# Patient Record
Sex: Male | Born: 1980
Health system: Southern US, Community
[De-identification: ages and names within clinical notes are randomized; demographics above are authoritative.]

## PROBLEM LIST (undated history)

## (undated) DIAGNOSIS — G43009 Migraine without aura, not intractable, without status migrainosus: Secondary | ICD-10-CM

## (undated) DIAGNOSIS — J45909 Unspecified asthma, uncomplicated: Secondary | ICD-10-CM

## (undated) DIAGNOSIS — F259 Schizoaffective disorder, unspecified: Secondary | ICD-10-CM

## (undated) HISTORY — PX: MOUTH SURGERY: SHX715

---

## 1999-08-14 ENCOUNTER — Emergency Department (HOSPITAL_COMMUNITY): Admission: EM | Admit: 1999-08-14 | Discharge: 1999-08-14 | Payer: Self-pay | Admitting: Emergency Medicine

## 2001-12-28 ENCOUNTER — Emergency Department (HOSPITAL_COMMUNITY): Admission: EM | Admit: 2001-12-28 | Discharge: 2001-12-28 | Payer: Self-pay | Admitting: Emergency Medicine

## 2002-01-15 ENCOUNTER — Emergency Department (HOSPITAL_COMMUNITY): Admission: EM | Admit: 2002-01-15 | Discharge: 2002-01-15 | Payer: Self-pay | Admitting: Emergency Medicine

## 2002-01-15 ENCOUNTER — Encounter: Payer: Self-pay | Admitting: Emergency Medicine

## 2004-09-12 ENCOUNTER — Emergency Department (HOSPITAL_COMMUNITY): Admission: EM | Admit: 2004-09-12 | Discharge: 2004-09-12 | Payer: Self-pay | Admitting: Emergency Medicine

## 2008-01-13 ENCOUNTER — Emergency Department (HOSPITAL_COMMUNITY): Admission: EM | Admit: 2008-01-13 | Discharge: 2008-01-13 | Payer: Self-pay | Admitting: Emergency Medicine

## 2008-08-18 ENCOUNTER — Emergency Department (HOSPITAL_COMMUNITY): Admission: EM | Admit: 2008-08-18 | Discharge: 2008-08-18 | Payer: Self-pay | Admitting: Emergency Medicine

## 2009-03-05 ENCOUNTER — Emergency Department (HOSPITAL_COMMUNITY): Admission: EM | Admit: 2009-03-05 | Discharge: 2009-03-06 | Payer: Self-pay | Admitting: Emergency Medicine

## 2009-03-10 ENCOUNTER — Emergency Department (HOSPITAL_COMMUNITY): Admission: EM | Admit: 2009-03-10 | Discharge: 2009-03-10 | Payer: Self-pay | Admitting: Emergency Medicine

## 2009-08-12 ENCOUNTER — Emergency Department (HOSPITAL_COMMUNITY): Admission: EM | Admit: 2009-08-12 | Discharge: 2009-08-12 | Payer: Self-pay | Admitting: Emergency Medicine

## 2011-09-05 LAB — GC/CHLAMYDIA PROBE AMP, GENITAL
Chlamydia, DNA Probe: POSITIVE — AB
GC Probe Amp, Genital: NEGATIVE

## 2011-09-18 LAB — BASIC METABOLIC PANEL
BUN: 10
CO2: 27
Calcium: 8.8
Chloride: 101
Creatinine, Ser: 1.06
GFR calc Af Amer: >60
GFR calc non Af Amer: >60
Glucose, Bld: 95
Potassium: 5
Sodium: 134 — ABNORMAL LOW

## 2011-09-18 LAB — CBC
HCT: 43.6
Hemoglobin: 14.5
MCHC: 33.3
MCV: 93
Platelets: 183
RBC: 4.69
RDW: 13.5
WBC: 7.8

## 2012-06-18 ENCOUNTER — Encounter (HOSPITAL_COMMUNITY): Payer: Self-pay | Admitting: *Deleted

## 2012-06-18 ENCOUNTER — Emergency Department (HOSPITAL_COMMUNITY)
Admission: EM | Admit: 2012-06-18 | Discharge: 2012-06-18 | Disposition: A | Discharge status: Home / Self Care | Payer: Self-pay | Attending: Emergency Medicine | Admitting: Emergency Medicine

## 2012-06-18 DIAGNOSIS — F17200 Nicotine dependence, unspecified, uncomplicated: Secondary | ICD-10-CM | POA: Insufficient documentation

## 2012-06-18 DIAGNOSIS — Z202 Contact with and (suspected) exposure to infections with a predominantly sexual mode of transmission: Secondary | ICD-10-CM | POA: Insufficient documentation

## 2012-06-18 MED ORDER — CEFTRIAXONE SODIUM 250 MG IJ SOLR
250.0000 mg | Freq: Once | INTRAMUSCULAR | Status: AC
  Administered 2012-06-18: 250 mg via INTRAMUSCULAR
  Filled 2012-06-18: qty 250

## 2012-06-18 MED ORDER — AZITHROMYCIN 1 G PO PACK
1.0000 g | PACK | Freq: Once | ORAL | Status: AC
  Administered 2012-06-18: 1 g via ORAL
  Filled 2012-06-18: qty 1

## 2012-06-18 NOTE — ED Provider Notes (Signed)
Medical screening examination/treatment/procedure(s) were performed by non-physician practitioner and as supervising physician I was immediately available for consultation/collaboration.    Nelia Shi, MD 06/18/12 2322

## 2012-06-18 NOTE — ED Notes (Signed)
Patient states that he wants to be checked out for sexual transmitted disease.  Denies being exposed recently.  Just states that he wants to be checked out.  Patient denies any symptoms

## 2012-06-18 NOTE — ED Provider Notes (Signed)
History     CSN: [id number]  Arrival date & time 06/18/12  0815   First MD Initiated Contact with Patient 06/18/12 [phone]      Chief Complaint  Patient presents with  . SEXUALLY TRANSMITTED DISEASE    (Consider location/radiation/quality/duration/timing/severity/associated sxs/prior treatment) HPI Comments: Patient is a 31 y/o male who presents with concerns about possible STI exposure. The patient stated that his girlfriend of one year was recently treated for a yeast infection with Fluconazole. The patient states that the relationship is monogamous and does not use protection. However, this information from his girlfriend caused him some concern that he may be at risk for an STI.  Denies, itching, dysuria, penile discharge, or genital sores. Denies other sexual partners.    History reviewed. No pertinent past medical history.  History reviewed. No pertinent past surgical history.  History reviewed. No pertinent family history.  History  Substance Use Topics  . Smoking status: Passive Smoker    Types: Cigarettes  . Smokeless tobacco: Not on file  . Alcohol Use: Yes      Review of Systems  Constitutional: Negative for fever and chills.  Gastrointestinal: Negative for abdominal pain.  Genitourinary: Negative for dysuria, discharge, penile swelling, scrotal swelling, difficulty urinating, genital sores, penile pain and testicular pain.    Allergies  Onion  Home Medications  No current outpatient prescriptions on file.  BP 152/87  Pulse 72  Temp 98.1 F (36.7 C)  Resp 20  SpO2 99%  Physical Exam  Constitutional: He appears well-developed and well-nourished.  Abdominal: Soft.  Genitourinary: Penis normal.       No sores noted on penis or scrotum. No testicular mass. No penile discharge.    ED Course  Procedures (including critical care time)   Labs Reviewed  GC/CHLAMYDIA PROBE AMP, GENITAL   No results found.   1. Potential exposure to STD       MDM   Patient is a 31 y/o male with possible STI exposure. Patient was swabbed for GC/Chlamydia and informed that results will be available in two days, will follow-up if positive. Patient was empirically treated with Rocephin and Azithromycin. Patient was also informed that he can be tested for other STI's at the local health department.       Pixie Casino, PA-C 06/18/12 1020

## 2012-06-20 LAB — GC/CHLAMYDIA PROBE AMP, GENITAL: GC Probe Amp, Genital: NEGATIVE

## 2014-05-25 ENCOUNTER — Emergency Department (INDEPENDENT_AMBULATORY_CARE_PROVIDER_SITE_OTHER)
Admission: EM | Admit: 2014-05-25 | Discharge: 2014-05-25 | Disposition: A | Discharge status: Home / Self Care | Payer: Self-pay | Source: Home / Self Care

## 2014-05-25 ENCOUNTER — Encounter (HOSPITAL_COMMUNITY): Payer: Self-pay | Admitting: Family Medicine

## 2014-05-25 DIAGNOSIS — R109 Unspecified abdominal pain: Secondary | ICD-10-CM

## 2014-05-25 DIAGNOSIS — S7010XA Contusion of unspecified thigh, initial encounter: Secondary | ICD-10-CM

## 2014-05-25 DIAGNOSIS — Z202 Contact with and (suspected) exposure to infections with a predominantly sexual mode of transmission: Secondary | ICD-10-CM

## 2014-05-25 LAB — POCT I-STAT, CHEM 8
BUN: 12 mg/dL (ref 6–23)
CALCIUM ION: 1.25 mmol/L — AB (ref 1.12–1.23)
CHLORIDE: 100 meq/L (ref 96–112)
Creatinine, Ser: 1.3 mg/dL (ref 0.50–1.35)
Glucose, Bld: 97 mg/dL (ref 70–99)
HEMATOCRIT: 48 % (ref 39.0–52.0)
Hemoglobin: 16.3 g/dL (ref 13.0–17.0)
POTASSIUM: 4.2 meq/L (ref 3.7–5.3)
Sodium: 143 mEq/L (ref 137–147)
TCO2: 30 mmol/L (ref 0–100)

## 2014-05-25 LAB — HEMOGLOBIN A1C
HEMOGLOBIN A1C: 5.8 % — AB (ref <5.7)
MEAN PLASMA GLUCOSE: 120 mg/dL — AB (ref <117)

## 2014-05-25 LAB — POCT URINALYSIS DIP (DEVICE)
GLUCOSE, UA: 100 mg/dL — AB
Hgb urine dipstick: NEGATIVE
Leukocytes, UA: NEGATIVE
Nitrite: NEGATIVE
Protein, ur: 30 mg/dL — AB
SPECIFIC GRAVITY, URINE: 1.025 (ref 1.005–1.030)
Urobilinogen, UA: 1 mg/dL (ref 0.0–1.0)
pH: 7 (ref 5.0–8.0)

## 2014-05-25 MED ORDER — IBUPROFEN 800 MG PO TABS
ORAL_TABLET | ORAL | Status: AC
  Filled 2014-05-25: qty 1

## 2014-05-25 MED ORDER — IBUPROFEN 800 MG PO TABS
800.0000 mg | ORAL_TABLET | Freq: Once | ORAL | Status: AC
  Administered 2014-05-25: 800 mg via ORAL

## 2014-05-25 NOTE — Discharge Instructions (Signed)
The cause of your side pain is not clear The lab work from today showed some sugar in your urine. This can come from many things but diabetes is a possibility. We are checking you today for diabetes. Please stay well hydrated with water and use advil (600mg ) for your pain. We will let you know if anything else comes back positive.  Please follow up with your regular doctor as needed

## 2014-05-25 NOTE — ED Notes (Signed)
Multiple complaints-seen by dr Konrad Dolores prior to this nurse

## 2014-05-25 NOTE — ED Provider Notes (Signed)
CSN: [id number]     Arrival date & time 05/25/14  6153 History   None    Chief Complaint  Patient presents with  . Leg Pain  . Exposure to STD   (Consider location/radiation/quality/duration/timing/severity/associated sxs/prior Treatment) HPI  Pain in "kidney". Sharp. Comes and goes. No aggrevating factors. Denies frequency or dysuria. Denies fevers or penile discharge. New sexual partner and not using condoms. Girlfirend recently tested adn everything came back negative.    R knee injury. Fell yesterday off porch and hit the concrete stair. No swelling, popping. Ambulation w/o difficulty.   History reviewed. No pertinent past medical history. History reviewed. No pertinent past surgical history. History reviewed. No pertinent family history. History  Substance Use Topics  . Smoking status: Passive Smoke Exposure - Never Smoker    Types: Cigarettes  . Smokeless tobacco: Not on file  . Alcohol Use: Yes    Review of Systems  Constitutional: Negative for fever.  Genitourinary: Positive for flank pain. Negative for dysuria, urgency, hematuria, discharge, genital sores and penile pain.  All other systems reviewed and are negative.   Allergies  Onion  Home Medications   Prior to Admission medications   Not on File   BP 138/83  Pulse 57  Temp(Src) 97.7 F (36.5 C) (Oral)  Resp 16  SpO2 98% Physical Exam  Constitutional: He appears well-developed. No distress.  HENT:  Head: Normocephalic and atraumatic.  Eyes: Pupils are equal, round, and reactive to light.  Neck: Normal range of motion.  Pulmonary/Chest: Effort normal. No respiratory distress.  Abdominal: Soft. He exhibits no distension.  Musculoskeletal:  No CVA tenderness R knee FROM, mild lateral distal thigh ttp along w/ mild vastus lateralis swelling w/ superficial abrasion. No induration or fluctuance   Neurological: He is alert.  Skin: Skin is warm. He is not diaphoretic.  Psychiatric: He has a normal mood  and affect. His behavior is normal. Judgment and thought content normal.    ED Course  Procedures (including critical care time) Labs Review Labs Reviewed  POCT URINALYSIS DIP (DEVICE) - Abnormal; Notable for the following:    Glucose, UA 100 (*)    Bilirubin Urine SMALL (*)    Ketones, ur TRACE (*)    Protein, ur 30 (*)    All other components within normal limits  POCT I-STAT, CHEM 8 - Abnormal; Notable for the following:    Calcium, Ion 1.25 (*)    All other components within normal limits  URINE CULTURE  HEMOGLOBIN A1C    Imaging Review No results found.   MDM   1. Side pain   2. Possible exposure to STD   3. Thigh contusion    Abnormal urine today possibly due to diabetic injury to the kidneys vs other illness. No sign of overt UTI or pyelo. STD unlikely but possible. Urine cultures sent, STD panel sent R thigh contusion - ACE wrap applied in office along w/ Motrin 800.  - precautions givena nd all questions answered.   Shelly Flatten, MD Family Medicine PGY-3 05/25/2014, 9:58 AM    Ozella Rocks, MD 05/25/14 [phone]

## 2014-05-25 NOTE — ED Provider Notes (Signed)
Medical screening examination/treatment/procedure(s) were performed by a resident physician or non-physician practitioner and as the supervising physician I was immediately available for consultation/collaboration.  Lynne Righi, MD    Amala Petion S Brenen Beigel, MD 05/25/14 1630 

## 2014-05-26 LAB — URINE CULTURE
CULTURE: NO GROWTH
Colony Count: NO GROWTH

## 2014-05-27 NOTE — ED Notes (Addendum)
Hgb A1C 5.8 with mean glucose of 120, Urine culture: No growth.  Message sent to Austin Gutierrez. Austin Gutierrez, Austin Gutierrez 05/27/2014 Austin Gutierrez wrote that it was not necessary to notify pt. " barely pre DM." Austin Gutierrez, Austin Gutierrez 05/30/2014

## 2014-06-03 ENCOUNTER — Emergency Department (INDEPENDENT_AMBULATORY_CARE_PROVIDER_SITE_OTHER)
Admission: EM | Admit: 2014-06-03 | Discharge: 2014-06-03 | Disposition: A | Discharge status: Home / Self Care | Payer: Self-pay | Source: Home / Self Care | Attending: Family Medicine | Admitting: Family Medicine

## 2014-06-03 ENCOUNTER — Encounter (HOSPITAL_COMMUNITY): Payer: Self-pay | Admitting: Emergency Medicine

## 2014-06-03 DIAGNOSIS — H6690 Otitis media, unspecified, unspecified ear: Secondary | ICD-10-CM

## 2014-06-03 DIAGNOSIS — H6691 Otitis media, unspecified, right ear: Secondary | ICD-10-CM

## 2014-06-03 MED ORDER — ANTIPYRINE-BENZOCAINE 5.4-1.4 % OT SOLN
3.0000 [drp] | OTIC | Status: DC | PRN
  Administered 2014-06-03: 3 [drp] via OTIC

## 2014-06-03 MED ORDER — NEOMYCIN-POLYMYXIN-HC 1 % OT SOLN
4.0000 [drp] | Freq: Four times a day (QID) | OTIC | Status: DC
  Administered 2014-06-03: 4 [drp] via OTIC

## 2014-06-03 MED ORDER — AMOXICILLIN-POT CLAVULANATE 875-125 MG PO TABS: 1.0000 | ORAL_TABLET | Freq: Two times a day (BID) | ORAL | Status: DC

## 2014-06-03 NOTE — Discharge Instructions (Signed)
Thank you for coming in today. Use the neomycin polymyxin B eardrops every 6 hours Use the Auralgan ear drops every 2 hours as needed for pain.  Take Augmentin twice daily Augmentin will be cheaper at [facility] ($18)  Return tomorrow if not 100% better.  Otitis Externa Otitis externa is a bacterial or fungal infection of the outer ear canal. This is the area from the eardrum to the outside of the ear. Otitis externa is sometimes called "swimmer's ear." CAUSES  Possible causes of infection include:  Swimming in dirty water.  Moisture remaining in the ear after swimming or bathing.  Mild injury (trauma) to the ear.  Objects stuck in the ear (foreign body).  Cuts or scrapes (abrasions) on the outside of the ear. SYMPTOMS  The first symptom of infection is often itching in the ear canal. Later signs and symptoms may include swelling and redness of the ear canal, ear pain, and yellowish-white fluid (pus) coming from the ear. The ear pain may be worse when pulling on the earlobe. DIAGNOSIS  Your caregiver will perform a physical exam. A sample of fluid may be taken from the ear and examined for bacteria or fungi. TREATMENT  Antibiotic ear drops are often given for 10 to 14 days. Treatment may also include pain medicine or corticosteroids to reduce itching and swelling. PREVENTION   Keep your ear dry. Use the corner of a towel to absorb water out of the ear canal after swimming or bathing.  Avoid scratching or putting objects inside your ear. This can damage the ear canal or remove the protective wax that lines the canal. This makes it easier for bacteria and fungi to grow.  Avoid swimming in lakes, polluted water, or poorly chlorinated pools.  You may use ear drops made of rubbing alcohol and vinegar after swimming. Combine equal parts of white vinegar and alcohol in a bottle. Put 3 or 4 drops into each ear after swimming. HOME CARE INSTRUCTIONS   Apply antibiotic ear drops to the ear  canal as prescribed by your caregiver.  Only take over-the-counter or prescription medicines for pain, discomfort, or fever as directed by your caregiver.  If you have diabetes, follow any additional treatment instructions from your caregiver.  Keep all follow-up appointments as directed by your caregiver. SEEK MEDICAL CARE IF:   You have a fever.  Your ear is still red, swollen, painful, or draining pus after 3 days.  Your redness, swelling, or pain gets worse.  You have a severe headache.  You have redness, swelling, pain, or tenderness in the area behind your ear. MAKE SURE YOU:   Understand these instructions.  Will watch your condition.  Will get help right away if you are not doing well or get worse. Document Released: 12/02/2005 Document Revised: 02/24/2012 Document Reviewed: 12/19/2011 [facility] Patient Information 2015 [place], [place]. This information is not intended to replace advice given to you by your health care provider. Make sure you discuss any questions you have with your health care provider.   Vertigo Vertigo means you feel like you or your surroundings are moving when they are not. Vertigo can be dangerous if it occurs when you are at work, driving, or performing difficult activities.  CAUSES  Vertigo occurs when there is a conflict of signals sent to your brain from the visual and sensory systems in your body. There are many different causes of vertigo, including:  Infections, especially in the inner ear.  A bad reaction to a drug or  misuse of alcohol and medicines.  Withdrawal from drugs or alcohol.  Rapidly changing positions, such as lying down or rolling over in bed.  A migraine headache.  Decreased blood flow to the brain.  Increased pressure in the brain from a head injury, infection, tumor, or bleeding. SYMPTOMS  You may feel as though the world is spinning around or you are falling to the ground. Because your balance is upset, vertigo can  cause nausea and vomiting. You may have involuntary eye movements (nystagmus). DIAGNOSIS  Vertigo is usually diagnosed by physical exam. If the cause of your vertigo is unknown, your caregiver may perform imaging tests, such as an MRI scan (magnetic resonance imaging). TREATMENT  Most cases of vertigo resolve on their own, without treatment. Depending on the cause, your caregiver may prescribe certain medicines. If your vertigo is related to body position issues, your caregiver may recommend movements or procedures to correct the problem. In rare cases, if your vertigo is caused by certain inner ear problems, you may need surgery. HOME CARE INSTRUCTIONS   Follow your caregiver's instructions.  Avoid driving.  Avoid operating heavy machinery.  Avoid performing any tasks that would be dangerous to you or others during a vertigo episode.  Tell your caregiver if you notice that certain medicines seem to be causing your vertigo. Some of the medicines used to treat vertigo episodes can actually make them worse in some people. SEEK IMMEDIATE MEDICAL CARE IF:   Your medicines do not relieve your vertigo or are making it worse.  You develop problems with talking, walking, weakness, or using your arms, hands, or legs.  You develop severe headaches.  Your nausea or vomiting continues or gets worse.  You develop visual changes.  A family member notices behavioral changes.  Your condition gets worse. MAKE SURE YOU:  Understand these instructions.  Will watch your condition.  Will get help right away if you are not doing well or get worse. Document Released: 09/11/2005 Document Revised: 02/24/2012 Document Reviewed: 06/20/2011 [facility] Patient Information 2015 [place], [place]. This information is not intended to replace advice given to you by your health care provider. Make sure you discuss any questions you have with your health care provider.

## 2014-06-03 NOTE — ED Provider Notes (Signed)
Austin Gutierrez is a 33 y.o. male who presents to Urgent Care today for ear pain. Patient has severe right-sided ear pain with discharge present for the last 2 days. It is associated with mild dizziness. He denies any fevers or chills nausea vomiting or diarrhea. He has tried some over-the-counter eardrops which have not helped. He denies any blurry vision. He denies any change in hearing. He denies any tinnitus.   History reviewed. No pertinent past medical history. History  Substance Use Topics  . Smoking status: Passive Smoke Exposure - Never Smoker    Types: Cigarettes  . Smokeless tobacco: Not on file  . Alcohol Use: Yes   ROS as above Medications: Current Facility-Administered Medications  Medication Dose Route Frequency Provider Last Rate Last Dose  . antipyrine-benzocaine (AURALGAN) otic solution 3-4 drop  3-4 drop Left Ear Q2H PRN Rodolph BongEvan S Corey, MD   3 drop at 06/03/14 1804  . NEOMYCIN-POLYMYXIN-HYDROCORTISONE (CORTISPORIN) otic solution 4 drop  4 drop Left Ear 4 times per day Rodolph BongEvan S Corey, MD   4 drop at 06/03/14 1804   Current Outpatient Prescriptions  Medication Sig Dispense Refill  . amoxicillin-clavulanate (AUGMENTIN) 875-125 MG per tablet Take 1 tablet by mouth every 12 (twelve) hours.  14 tablet  0    Exam:  BP 141/81  Pulse 78  Temp(Src) 99.9 F (37.7 C) (Oral)  Resp 20  SpO2 100% Gen: Well NAD HEENT: EOMI,  MMM right ear canal is erythematous with discharge. The tympanic membrane is erythematous without effusion. Patient has no tenderness to the mastoid bilaterally. The left membranes retracted but normal-appearing otherwise. Lungs: Normal work of breathing. CTABL Heart: RRR no MRG Abd: NABS, Soft. NT, ND Exts: Brisk capillary refill, warm and well perfused.  Neuro: Alert and oriented normal coordination. PERRLA. Impaired Romberg balance. Normal gait. Sensation and strength are intact throughout.  No results found for this or any previous visit (from the past 24  hour(s)). No results found.  Assessment and Plan: 33 y.o. male with otitis externa. This is complicated by dizziness. I suspect the dizziness is related to his urine infection. He has no clinical signs of mastoiditis. Plan for aggressive therapy with topical Cortisporin drops as well as oral Augmentin and followup tomorrow. Present to the emergency room with worsening. Auralgan for pain control.  Discussed warning signs or symptoms. Please see discharge instructions. Patient expresses understanding.    Rodolph BongEvan S Corey, MD 06/03/14 [phone]

## 2014-06-03 NOTE — ED Notes (Signed)
C/o  Severe  right ear pain with clear drainage x 2 days.   Low grade temp.   States feel extremely dizzy.   No relief with otc ear drops.

## 2014-07-09 ENCOUNTER — Encounter (HOSPITAL_COMMUNITY): Payer: Self-pay | Admitting: Emergency Medicine

## 2014-07-09 ENCOUNTER — Emergency Department (INDEPENDENT_AMBULATORY_CARE_PROVIDER_SITE_OTHER)
Admission: EM | Admit: 2014-07-09 | Discharge: 2014-07-09 | Disposition: A | Discharge status: Home / Self Care | Payer: Self-pay | Source: Home / Self Care | Attending: Family Medicine | Admitting: Family Medicine

## 2014-07-09 DIAGNOSIS — Z202 Contact with and (suspected) exposure to infections with a predominantly sexual mode of transmission: Secondary | ICD-10-CM

## 2014-07-09 MED ORDER — AZITHROMYCIN 250 MG PO TABS
1000.0000 mg | ORAL_TABLET | Freq: Once | ORAL | Status: AC
  Administered 2014-07-09: 1000 mg via ORAL

## 2014-07-09 MED ORDER — LIDOCAINE HCL (PF) 1 % IJ SOLN
INTRAMUSCULAR | Status: AC
  Filled 2014-07-09: qty 5

## 2014-07-09 MED ORDER — CEFTRIAXONE SODIUM 250 MG IJ SOLR
250.0000 mg | Freq: Once | INTRAMUSCULAR | Status: AC
  Administered 2014-07-09: 250 mg via INTRAMUSCULAR

## 2014-07-09 MED ORDER — CEFTRIAXONE SODIUM 250 MG IJ SOLR
INTRAMUSCULAR | Status: AC
  Filled 2014-07-09: qty 250

## 2014-07-09 MED ORDER — AZITHROMYCIN 250 MG PO TABS
ORAL_TABLET | ORAL | Status: AC
  Filled 2014-07-09: qty 4

## 2014-07-09 NOTE — Discharge Instructions (Signed)
You were treated for STDs today Your treatment is complete Come back if you develop any further symptoms  Sexually Transmitted Disease A sexually transmitted disease (STD) is a disease or infection that may be passed (transmitted) from person to person, usually during sexual activity. This may happen by way of saliva, semen, blood, vaginal mucus, or urine. Common STDs include:   Gonorrhea.   Chlamydia.   Syphilis.   HIV and AIDS.   Genital herpes.   Hepatitis B and C.   Trichomonas.   Human papillomavirus (HPV).   Pubic lice.   Scabies.  Mites.  Bacterial vaginosis. WHAT ARE CAUSES OF STDs? An STD may be caused by bacteria, a virus, or parasites. STDs are often transmitted during sexual activity if one person is infected. However, they may also be transmitted through nonsexual means. STDs may be transmitted after:   Sexual intercourse with an infected person.   Sharing sex toys with an infected person.   Sharing needles with an infected person or using unclean piercing or tattoo needles.  Having intimate contact with the genitals, mouth, or rectal areas of an infected person.   Exposure to infected fluids during birth. WHAT ARE THE SIGNS AND SYMPTOMS OF STDs? Different STDs have different symptoms. Some people may not have any symptoms. If symptoms are present, they may include:   Painful or bloody urination.   Pain in the pelvis, abdomen, vagina, anus, throat, or eyes.   A skin rash, itching, or irritation.  Growths, ulcerations, blisters, or sores in the genital and anal areas.  Abnormal vaginal discharge with or without bad odor.   Penile discharge in men.   Fever.   Pain or bleeding during sexual intercourse.   Swollen glands in the groin area.   Yellow skin and eyes (jaundice). This is seen with hepatitis.   Swollen testicles.  Infertility.  Sores and blisters in the mouth. HOW ARE STDs DIAGNOSED? To make a diagnosis,  your health care provider may:   Take a medical history.   Perform a physical exam.   Take a sample of any discharge to examine.  Swab the throat, cervix, opening to the penis, rectum, or vagina for testing.  Test a sample of your first morning urine.   Perform blood tests.   Perform a Pap test, if this applies.   Perform a colposcopy.   Perform a laparoscopy.  HOW ARE STDs TREATED? Treatment depends on the STD. Some STDs may be treated but not cured.   Chlamydia, gonorrhea, trichomonas, and syphilis can be cured with antibiotic medicine.   Genital herpes, hepatitis, and HIV can be treated, but not cured, with prescribed medicines. The medicines lessen symptoms.   Genital warts from HPV can be treated with medicine or by freezing, burning (electrocautery), or surgery. Warts may come back.   HPV cannot be cured with medicine or surgery. However, abnormal areas may be removed from the cervix, vagina, or vulva.   If your diagnosis is confirmed, your recent sexual partners need treatment. This is true even if they are symptom-free or have a negative culture or evaluation. They should not have sex until their health care providers say it is okay. HOW CAN I REDUCE MY RISK OF GETTING AN STD? Take these steps to reduce your risk of getting an STD:  Use latex condoms, dental dams, and water-soluble lubricants during sexual activity. Do not use petroleum jelly or oils.  Avoid having multiple sex partners.  Do not have sex with someone who  has other sex partners.  Do not have sex with anyone you do not know or who is at high risk for an STD.  Avoid risky sex practices that can break your skin.  Do not have sex if you have open sores on your mouth or skin.  Avoid drinking too much alcohol or taking illegal drugs. Alcohol and drugs can affect your judgment and put you in a vulnerable position.  Avoid engaging in oral and anal sex acts.  Get vaccinated for HPV and  hepatitis. If you have not received these vaccines in the past, talk to your health care provider about whether one or both might be right for you.   If you are at risk of being infected with HIV, it is recommended that you take a prescription medicine daily to prevent HIV infection. This is called pre-exposure prophylaxis (PrEP). You are considered at risk if:  You are a man who has sex with other men (MSM).  You are a heterosexual man or woman and are sexually active with more than one partner.  You take drugs by injection.  You are sexually active with a partner who has HIV.  Talk with your health care provider about whether you are at high risk of being infected with HIV. If you choose to begin PrEP, you should first be tested for HIV. You should then be tested every 3 months for as long as you are taking PrEP.  WHAT SHOULD I DO IF I THINK I HAVE AN STD?  See your health care provider.   Tell your sexual partner(s). They should be tested and treated for any STDs.  Do not have sex until your health care provider says it is okay. WHEN SHOULD I GET IMMEDIATE MEDICAL CARE? Contact your health care provider right away if:   You have severe abdominal pain.  You are a man and notice swelling or pain in your testicles.  You are a woman and notice swelling or pain in your vagina. Document Released: 02/22/2003 Document Revised: 12/07/2013 Document Reviewed: 06/22/2013 [facility] Patient Information 2015 [place], [place]. This information is not intended to replace advice given to you by your health care provider. Make sure you discuss any questions you have with your health care provider.

## 2014-07-09 NOTE — ED Notes (Signed)
Here to be checked for std States no sx   Has finished a relationship

## 2014-07-09 NOTE — ED Provider Notes (Signed)
CSN: [id number]     Arrival date & time 07/09/14  [id number] History   First MD Initiated Contact with Patient 07/09/14 0920     Chief Complaint  Patient presents with  . SEXUALLY TRANSMITTED DISEASE   (Consider location/radiation/quality/duration/timing/severity/associated sxs/prior Treatment) HPI  Sexual partner w/ STD - chlamydia and gonorrhea. Sex is unprotected. Sex since June w/ current partner.  Denies any penile pain, discharge, abd pain, swollen lymphnodes, rash, fevers. Pt states his partner is getting treatment but they are no longer together.    History reviewed. No pertinent past medical history. History reviewed. No pertinent past surgical history. History reviewed. No pertinent family history. History  Substance Use Topics  . Smoking status: Passive Smoke Exposure - Never Smoker    Types: Cigarettes  . Smokeless tobacco: Not on file  . Alcohol Use: Yes    Review of Systems Per HPI with all other pertinent systems negative.   Allergies  Onion  Home Medications   Prior to Admission medications   Medication Sig Start Date End Date Taking? Authorizing Provider  amoxicillin-clavulanate (AUGMENTIN) 875-125 MG per tablet Take 1 tablet by mouth every 12 (twelve) hours. 06/03/14   Rodolph BongEvan S Corey, MD   BP 127/80  Pulse 82  Temp(Src) 98.1 F (36.7 C) (Oral)  Resp 16  SpO2 100% Physical Exam  Constitutional: He appears well-developed and well-nourished. No distress.  HENT:  Head: Normocephalic and atraumatic.  Eyes: EOM are normal. Pupils are equal, round, and reactive to light.  Neck: Normal range of motion.  Pulmonary/Chest: Effort normal. No respiratory distress.  Abdominal: He exhibits no distension.  Musculoskeletal: Normal range of motion. He exhibits no edema.  Neurological: He is alert. He exhibits normal muscle tone.  Skin: Skin is warm. No rash noted. He is not diaphoretic.  Psychiatric: He has a normal mood and affect. His behavior is normal. Judgment and  thought content normal.    ED Course  Procedures (including critical care time) Labs Review Labs Reviewed - No data to display  Imaging Review No results found.   MDM   1. STD exposure    Sexual partner w/ Gc/Chl. Treat imperically w/ 1gm Azithro and 250mg  IM CTX - Precautions given and all questions answered  Shelly Flattenavid Merrell, MD Family Medicine 07/09/2014, 9:45 AM      Ozella Rocksavid J Merrell, MD 07/09/14 [phone]

## 2014-07-23 ENCOUNTER — Telehealth (HOSPITAL_COMMUNITY): Payer: Self-pay | Admitting: Family Medicine

## 2014-07-23 ENCOUNTER — Other Ambulatory Visit (HOSPITAL_COMMUNITY)
Admission: RE | Admit: 2014-07-23 | Discharge: 2014-07-23 | Disposition: A | Discharge status: Home / Self Care | Payer: Self-pay | Source: Ambulatory Visit | Attending: Family Medicine | Admitting: Family Medicine

## 2014-07-23 DIAGNOSIS — Z113 Encounter for screening for infections with a predominantly sexual mode of transmission: Secondary | ICD-10-CM | POA: Insufficient documentation

## 2014-07-23 NOTE — ED Notes (Signed)
Patient came in requesting test results.  Dr Denyse Amassorey and myself reviewed patient's chart.  There was not an order placed for urine cytology.  Patent made aware and specimen collected.

## 2014-08-16 ENCOUNTER — Emergency Department (HOSPITAL_COMMUNITY)
Admission: EM | Admit: 2014-08-16 | Discharge: 2014-08-17 | Disposition: A | Discharge status: Other Acute Inpatient Hospital | Payer: Self-pay | Attending: Psychiatry | Admitting: Psychiatry

## 2014-08-16 ENCOUNTER — Encounter (HOSPITAL_COMMUNITY): Payer: Self-pay | Admitting: Emergency Medicine

## 2014-08-16 DIAGNOSIS — R0602 Shortness of breath: Secondary | ICD-10-CM | POA: Insufficient documentation

## 2014-08-16 DIAGNOSIS — F22 Delusional disorders: Secondary | ICD-10-CM | POA: Insufficient documentation

## 2014-08-16 DIAGNOSIS — F410 Panic disorder [episodic paroxysmal anxiety] without agoraphobia: Secondary | ICD-10-CM | POA: Insufficient documentation

## 2014-08-16 DIAGNOSIS — F29 Unspecified psychosis not due to a substance or known physiological condition: Secondary | ICD-10-CM

## 2014-08-16 LAB — I-STAT TROPONIN, ED: Troponin i, poc: 0 ng/mL (ref 0.00–0.08)

## 2014-08-16 LAB — COMPREHENSIVE METABOLIC PANEL
ALBUMIN: 3.9 g/dL (ref 3.5–5.2)
ALT: 12 U/L (ref 0–53)
ANION GAP: 13 (ref 5–15)
AST: 22 U/L (ref 0–37)
Alkaline Phosphatase: 31 U/L — ABNORMAL LOW (ref 39–117)
BUN: 17 mg/dL (ref 6–23)
CALCIUM: 9.2 mg/dL (ref 8.4–10.5)
CO2: 24 mEq/L (ref 19–32)
CREATININE: 1.09 mg/dL (ref 0.50–1.35)
Chloride: 104 mEq/L (ref 96–112)
GFR calc Af Amer: >90 mL/min (ref >90)
GFR calc non Af Amer: 87 mL/min — ABNORMAL LOW (ref >90)
Glucose, Bld: 100 mg/dL — ABNORMAL HIGH (ref 70–99)
POTASSIUM: 3.7 meq/L (ref 3.7–5.3)
Sodium: 141 mEq/L (ref 137–147)
TOTAL PROTEIN: 6.7 g/dL (ref 6.0–8.3)
Total Bilirubin: 0.6 mg/dL (ref 0.3–1.2)

## 2014-08-16 LAB — CBC
HEMATOCRIT: 39.8 % (ref 39.0–52.0)
Hemoglobin: 13.7 g/dL (ref 13.0–17.0)
MCH: 30.4 pg (ref 26.0–34.0)
MCHC: 34.4 g/dL (ref 30.0–36.0)
MCV: 88.4 fL (ref 78.0–100.0)
Platelets: 233 10*3/uL (ref 150–400)
RBC: 4.5 MIL/uL (ref 4.22–5.81)
RDW: 13.5 % (ref 11.5–15.5)
WBC: 6.3 10*3/uL (ref 4.0–10.5)

## 2014-08-16 LAB — SALICYLATE LEVEL

## 2014-08-16 LAB — URINALYSIS, ROUTINE W REFLEX MICROSCOPIC
Glucose, UA: NEGATIVE mg/dL
Hgb urine dipstick: NEGATIVE
Ketones, ur: 15 mg/dL — AB
Leukocytes, UA: NEGATIVE
NITRITE: NEGATIVE
PROTEIN: 30 mg/dL — AB
SPECIFIC GRAVITY, URINE: 1.031 — AB (ref 1.005–1.030)
UROBILINOGEN UA: 1 mg/dL (ref 0.0–1.0)
pH: 5.5 (ref 5.0–8.0)

## 2014-08-16 LAB — URINE MICROSCOPIC-ADD ON

## 2014-08-16 LAB — ACETAMINOPHEN LEVEL

## 2014-08-16 LAB — RAPID URINE DRUG SCREEN, HOSP PERFORMED
Amphetamines: NOT DETECTED
BARBITURATES: NOT DETECTED
Benzodiazepines: NOT DETECTED
COCAINE: NOT DETECTED
Opiates: NOT DETECTED
TETRAHYDROCANNABINOL: POSITIVE — AB

## 2014-08-16 LAB — ETHANOL

## 2014-08-16 MED ORDER — IBUPROFEN 200 MG PO TABS: 600.0000 mg | ORAL_TABLET | Freq: Three times a day (TID) | ORAL | Status: DC | PRN

## 2014-08-16 MED ORDER — NICOTINE 21 MG/24HR TD PT24
21.0000 mg | MEDICATED_PATCH | Freq: Every day | TRANSDERMAL | Status: DC
  Administered 2014-08-16: 21 mg via TRANSDERMAL
  Filled 2014-08-16 (×2): qty 1

## 2014-08-16 MED ORDER — ACETAMINOPHEN 325 MG PO TABS: 650.0000 mg | ORAL_TABLET | ORAL | Status: DC | PRN

## 2014-08-16 MED ORDER — LORAZEPAM 1 MG PO TABS
1.0000 mg | ORAL_TABLET | Freq: Three times a day (TID) | ORAL | Status: DC | PRN
  Administered 2014-08-16: 1 mg via ORAL
  Filled 2014-08-16: qty 1

## 2014-08-16 MED ORDER — ONDANSETRON HCL 4 MG PO TABS: 4.0000 mg | ORAL_TABLET | Freq: Three times a day (TID) | ORAL | Status: DC | PRN
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
## 2014-08-16 NOTE — ED Notes (Signed)
Security at bedside

## 2014-08-16 NOTE — ED Notes (Signed)
Pt to room; no s/s of distress noted at this time. Pt denies SI/HI or A/V hallucinations. Pt pleasant and cooperative with assessment.

## 2014-08-16 NOTE — ED Notes (Signed)
Patient in paper scrubs and clothes in belongings in 2 Bags with label on bag.
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
## 2014-08-16 NOTE — ED Notes (Signed)
Brought pt over to room via wheelchair with mother in tow; pt settled on bed with 2 warm blankets given; mother at bedside; pt's 2 belongings bags are behind nurses' station and were already labeled; pt was already changed into wine colored scrubs and non-slip socks; pt resting with mother sitting at bedside

## 2014-08-16 NOTE — ED Notes (Signed)
Security paged pt to be wanded

## 2014-08-16 NOTE — ED Notes (Signed)
Security wanded pt ?

## 2014-08-17 DIAGNOSIS — F29 Unspecified psychosis not due to a substance or known physiological condition: Secondary | ICD-10-CM
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
## 2014-08-17 NOTE — Progress Notes (Signed)
[facility] [employer],   Did not get to see patient but will be sending information about [facility] [employer], to help patient establish care, using the address provided.

## 2014-08-17 NOTE — ED Notes (Signed)
Pt presents with anxious affect and mood.  Pt denies SI/HI, denies AH/VH.  Pt denies pain.  Pt is cooperative with staff.  Pt reports "I'm doing okay.  It was nice to see someone I know earlier."  Pt is in no acute distress at this time.  Provided with snack and beverage.  Encouraged pt to report needs/concerns.  Will continue to monitor for safety.

## 2014-08-21 ENCOUNTER — Encounter (HOSPITAL_COMMUNITY): Payer: Self-pay | Admitting: Emergency Medicine

## 2014-08-21 ENCOUNTER — Emergency Department (HOSPITAL_COMMUNITY)
Admission: EM | Admit: 2014-08-21 | Discharge: 2014-08-21 | Disposition: A | Discharge status: Home / Self Care | Payer: Self-pay | Attending: Emergency Medicine | Admitting: Emergency Medicine

## 2014-08-21 DIAGNOSIS — R369 Urethral discharge, unspecified: Secondary | ICD-10-CM | POA: Insufficient documentation

## 2014-08-21 DIAGNOSIS — F17200 Nicotine dependence, unspecified, uncomplicated: Secondary | ICD-10-CM | POA: Insufficient documentation

## 2014-08-21 DIAGNOSIS — R51 Headache: Secondary | ICD-10-CM | POA: Insufficient documentation

## 2014-08-21 DIAGNOSIS — R519 Headache, unspecified: Secondary | ICD-10-CM

## 2014-08-21 LAB — URINALYSIS, ROUTINE W REFLEX MICROSCOPIC
Bilirubin Urine: NEGATIVE
GLUCOSE, UA: NEGATIVE mg/dL
Hgb urine dipstick: NEGATIVE
KETONES UR: NEGATIVE mg/dL
LEUKOCYTES UA: NEGATIVE
Nitrite: NEGATIVE
PH: 7.5 (ref 5.0–8.0)
Protein, ur: NEGATIVE mg/dL
Specific Gravity, Urine: 1.013 (ref 1.005–1.030)
Urobilinogen, UA: 1 mg/dL (ref 0.0–1.0)

## 2014-08-21 LAB — RPR

## 2014-08-21 LAB — HIV ANTIBODY (ROUTINE TESTING W REFLEX): HIV 1&2 Ab, 4th Generation: NONREACTIVE

## 2014-08-21 NOTE — ED Notes (Signed)
Pt reports trying to urinate this am and had penile discharge and now headache. Pt very anxious at triage.

## 2014-08-21 NOTE — ED Provider Notes (Signed)
Medical screening examination/treatment/procedure(s) were performed by non-physician practitioner and as supervising physician I was immediately available for consultation/collaboration.   EKG Interpretation None       Doug Sou, MD 08/21/14 [phone]

## 2014-08-21 NOTE — ED Notes (Signed)
Pt anxious stating "I was trying to have a bowel movement this AM and pre-cum came out, and it scared me" denies further penile discharge. Pt continually states that  "something is wrong,  Something is not right inside me."

## 2014-08-21 NOTE — Discharge Instructions (Signed)
You have been evaluated for your penile discharge and headache.  Take tylenol or ibuprofen for headache as needed. We have sent test away to test for any signs of infection which may cause penile discharge.  You will be notify in the next few days if you tested positive for infection.  ]   Emergency Department Resource Guide 1) Find a Doctor and Pay Out of Pocket Although you won't have to find out who is covered by your insurance plan, it is a good idea to ask around and get recommendations. You will then need to call the office and see if the doctor you have chosen will accept you as a new patient and what types of options they offer for patients who are self-pay. Some doctors offer discounts or will set up payment plans for their patients who do not have insurance, but you will need to ask so you aren't surprised when you get to your appointment.  2) Contact Your Local Health Department Not all health departments have doctors that can see patients for sick visits, but many do, so it is worth a call to see if yours does. If you don't know where your local health department is, you can check in your phone book. The CDC also has a tool to help you locate your state's health department, and many state websites also have listings of all of their local health departments.  3) Find a Walk-in Clinic If your illness is not likely to be very severe or complicated, you may want to try a walk in clinic. These are popping up all over the country in pharmacies, drugstores, and shopping centers. They're usually staffed by nurse practitioners or physician assistants that have been trained to treat common illnesses and complaints. They're usually fairly quick and inexpensive. However, if you have serious medical issues or chronic medical problems, these are probably not your best option.  No Primary Care Doctor: - Call Health Connect at  [phone] - they can help you locate a primary care doctor that  accepts your  insurance, provides certain services, etc. - Physician Referral Service- [phone]  Chronic Pain Problems: Organization         Address  Phone   Notes  [facility]  [phone] Patients need to be referred by their primary care doctor.   Medication Assistance: Organization         Address  Phone   Notes  [facility] Medication [facility] [address] [place] [place], [place] [address] [phone] --Must be a resident of [facility] -- Must have NO insurance coverage whatsoever (no Medicaid/ Medicare, etc.) -- The pt. MUST have a primary care doctor that directs their care regularly and follows them in the community   [facility]ist  [phone]   [employer]  [phone]    Agencies that provide inexpensive medical care: Organization         Address  Phone   Notes  [facility]  [phone]   [facility]    [phone]   [facility] [address] [place], [place] [address] [phone]   [facility] [address] [place], [place] [phone]   Planned Parenthood    [phone]   [facility]    [phone]   [facility]  [address], [place] Phone:  [phone], Fax:  [phone] Hours  of Operation:  9 am - 6 pm, M-F.  Also accepts Medicaid/Medicare and self-pay.  [facility] for [place], [address], [place] Phone: [phone], Fax: [phone]. Hours of Operation:  8:30 am - 5:30 pm, M-F.  Also accepts Medicaid and self-pay.  [facility] [place] [address], [place] Phone: [phone]   [place], [place], [place] [phone], Ext. [phone] Mondays & Thursdays: 7-9 AM.  First 15 patients are seen on a first come, first serve basis.    [place] Providers:  Organization          Address  Phone   Notes  [facility] [address],  [phone] Also accepts self-pay patients.  [facility] [address], [place]  [phone]   [place], [address], [place] [phone]   [facility] Family Medicine [address] [place] [phone]   Lucianne Lei [address], [place]   [phone] Only accepts [place] Access [place] patients after they have their name applied to their card.   Self-Pay (no insurance) in [facility]:  Organization         Address  Phone   Notes  Sickle Cell Patients, [facility] Internal Medicine [place] [phone]   [facility] Urgent Care [place] [phone]   [facility] [place]  [place], [place], [place] [phone]   [facility]/Dr. Osei-Bonsu  [address], [place] or [address], [place] [phone] Phone number for both [place] and [place] locations is the same.  [facility]gent Medical and [facility] [address] [place] [phone]   [facility] [address], [place] or [address] [phone]   [facility] [address] [place] [phone], phone; [phone], fax Sees patients 1st and 3rd Saturday of every month.  Must not qualify for public or private insurance (i.e. Medicaid, Medicare, [place] Health Choice, Veterans' Benefits)  Household income should be no more than 200% of the poverty level The clinic cannot treat you if you are pregnant or think you are pregnant  Sexually transmitted diseases are not treated at the clinic.    Dental Care: Organization         Address  Phone  Notes  [facility] Department of [facility] [phone] Accepts children up to age 64 who are enrolled in [place] or [place]; pregnant women with a Medicaid card; and children who have applied for Medicaid or [place] Health Choice, but were declined, whose parents can pay a reduced fee at time of service.  [facility] Department of [facility]  [address], [place] [phone] Accepts children up to age 2 who are enrolled in [place] or [place]; pregnant women with a Medicaid card; and children who have applied for Medicaid or [place] Health Choice, but were declined, whose parents can pay a reduced fee at time of service.  [place] Adult Dental Access PROGRAM  [place] [phone] Patients are seen by appointment only. Walk-ins are not accepted. [place] will see  patients 61 years of age and older. Monday - Tuesday (8am-5pm) Most Wednesdays (8:30-5pm) $30 per visit, cash only  [facility] Adult Dental Access PROGRAM  [address], [facility] [phone] Patients are seen by appointment only. Walk-ins are not accepted. [place] will see patients 29 years of age and older. One Wednesday Evening (Monthly: Volunteer Based).  $30 per visit, cash only  [place]  [phone] for adults; Children under age 71, call Graduate Pediatric Dentistry at [phone]. Children aged 36-14, please call [phone] to request a pediatric application.  Dental services are provided in all areas of dental care including fillings, crowns and bridges, complete and partial dentures, implants, gum treatment, root canals, and extractions. Preventive care is also provided. Treatment is provided to both adults and children. Patients are selected via a lottery and there is often a waiting list.   [facility] [address], [place]  [phone] [email or web address]   [facility] [address] [place], [place]  [phone], Ext. [phone] Second and Fourth Thursday of each month, opens at 6:30 AM; Clinic ends at 9 AM.  Patients are seen on a first-come first-served basis, and a limited number are seen during each clinic.   [facility]  [address] Hillard Danker [place], [place] [phone]   Eligibility Requirements You must have lived in [place], [place], or [place] counties for at least the last three months.   You cannot be eligible for state or federal sponsored [employer], including [employer], [place], or [employer].   You generally cannot be eligible for healthcare insurance through your employer.    How to apply: Eligibility screenings are held every Tuesday and Wednesday afternoon from 1:00 pm until 4:00 pm. You do not need an appointment for the interview!  [facility] [address] [place], [place]   [place]  [place] Department  [place]  [phone]    Behavioral Health Resources in the Community: Intensive Outpatient Programs Organization         Address  Phone  Notes  Leona Louin. [address] [place], [place] [phone]   [facility] Outpatient [address], [place], [place]   ADS: [facility] [address] [place], [place]   [place] [address]  [place], [place] or [phone]   Substance Abuse Resources Organization         Address  Phone  Notes  Alcohol and Drug Services  [phone]   [place]  [phone]   [facility] [place]   Chinita Pester  [phone]   Residential & Outpatient Substance Abuse Program  [phone]   Psychological Services Organization         Address  Phone  Notes  [facility]  [place]  [phone]    [facility] [address] [place] or [phone]    Mobile Crisis Teams Organization         Address  Phone  Notes  [facility], Mobile Crisis Care Unit  [phone]   [facility]ive Psychotherapeutic Services  [address] [place], [place]   [facility] [address] [place] [phone]    Self-Help/Support Groups Organization  Address  Phone             Notes  [place]. of [place] - variety of support groups  [place] Call for more information  Narcotics Anonymous (NA), [facility] [address], [employer] [place]  2 meetings at this location   Special educational needs teacher         Address  Phone  Notes  ASAP Residential Treatment [place],    [place]  [phone]   [facility]  [address] [place] [address], [place], [place]   [place], [place] [phone] Admissions: 8am-3pm M-F  Incentives Substance [place] [address],    [place], [place] [phone]   [facility] [address] [place], [place], [place]   [facility] [address]  [place], [place]   [facility]ight Programs - Intensive Outpatient [address] Kristeen [address], [place], [place]   [facility].,  [place], [place] [phone] or [phone]   [facility] Services (RTS) [address] [place], [place] Accepts Medicaid  [facility] [place] [address]  [place] [phone] Substance Abuse/Addiction Treatment   [facility] Organization         Address  Phone  Notes  [facility]  [phone]   Domenic Schwab, PhD [address] [place], [place]   [phone] or [phone]   [place]  [place], [place] [phone]   [facility] [address] [place], [place] [phone] Insurance/Medicaid/sponsorship through [facility] and Families [address] [place]                                    [place], [place] [phone] [place] [address], [place] [phone]    Dr. Adele Schilder  [phone]   [facility] Dept. 1) [address], [place] 2) [place] 3)  [place] [address], [place] [phone]  [phone]   [place] [phone] or [phone] (After Hours)

## 2014-08-21 NOTE — ED Provider Notes (Signed)
CSN: [id number]     Arrival date & time 08/21/14  1055 History   First MD Initiated Contact with Patient 08/21/14 1150     Chief Complaint  Patient presents with  . Headache  . Penile Discharge     (Consider location/radiation/quality/duration/timing/severity/associated sxs/prior Treatment) HPI  33 year old male with no known psychiatric disease who was seen this week for paranoia and was discharged by psychiatrist for outpatient evaluation presents complaining of penile discharge and headache. Patient reports this morning he was using the bathroom and while straining he notice mild penile discharge. This is new and concerns him. Subsequently he complains of sharp throbbing headache throughout his head that has been persistent. Patient states this is his typical migraine headaches that he has since he was young. He took [facility] powder prior to arrival and states it has helped his headache. His primary complaint is the penile discharge. Report a prior history of gonorrhea and trichomonas in 2008 which has been treated. He is sexually active with one partner, not using protection every single time. Patient states "I just wanted to get checked out to make sure I'm okay". He denies fever, abdominal pain, back pain, penile pain, scrotal pain or swelling, or rash. He denies any recent recreational drug use. He admits to being anxious from the penile discharge.  No eye or throat complaints.    History reviewed. No pertinent past medical history. History reviewed. No pertinent past surgical history. History reviewed. No pertinent family history. History  Substance Use Topics  . Smoking status: Heavy Tobacco Smoker -- 0.50 packs/day    Types: Cigarettes  . Smokeless tobacco: Not on file  . Alcohol Use: Yes    Review of Systems  Constitutional: Negative for fever.  Genitourinary: Positive for discharge.  Skin: Negative for rash.  All other systems reviewed and are negative.     Allergies   Excedrin back & and Onion  Home Medications   Prior to Admission medications   Not on File   BP 146/92  Pulse 87  Temp(Src) 97.9 F (36.6 C) (Oral)  Resp 22  SpO2 99% Physical Exam  Constitutional: He appears well-developed and well-nourished. No distress.  HENT:  Head: Atraumatic.  Eyes: Conjunctivae and EOM are normal.  Neck: Normal range of motion. Neck supple.  No nuchal rigidity  Genitourinary: Testes normal and penis normal. Cremasteric reflex is present. Right testis shows no swelling and no tenderness. Left testis shows no swelling and no tenderness. Circumcised. No penile erythema.  Lymphadenopathy:       Right: No inguinal adenopathy present.       Left: No inguinal adenopathy present.  Neurological: He is alert. He has normal strength. No cranial nerve deficit or sensory deficit. GCS eye subscore is 4. GCS verbal subscore is 5. GCS motor subscore is 6.  Skin: No rash noted.  Psychiatric: He has a normal mood and affect.    ED Course  Procedures (including critical care time)  12:13 PM Patient was recently evaluated for paranoia that will require outpatient evaluation who presents with penile discharge. Patient appears very anxious.  No significant penile pain or discharge on exam however I will obtain HIV, RPR, gonorrhea and Chlamydia test and UA to evaluate for possible STD. Patient did report headache, this is his usual headache, no red flags, headache is improving. I offer treatment for his headache but patient declined.   2:16 PM i offer prophylactic abx treatment, pt declined.  Cultures sent.  Otherwise pt stable for discharge.  Resources provided.  Return precaution discussed. Recommend safe sex practice.  Labs Review Labs Reviewed  GC/CHLAMYDIA PROBE AMP  URINALYSIS, ROUTINE W REFLEX MICROSCOPIC  RPR  HIV ANTIBODY (ROUTINE TESTING)    Imaging Review No results found.   EKG Interpretation None      MDM   Final diagnoses:  Penile discharge   Headache, unspecified headache type    BP 146/92  Pulse 87  Temp(Src) 97.9 F (36.6 C) (Oral)  Resp 22  SpO2 99%     Fayrene Helper, PA-C 08/21/14 1417

## 2014-08-24 LAB — GC/CHLAMYDIA PROBE AMP
CT PROBE, AMP APTIMA: NEGATIVE
GC PROBE AMP APTIMA: NEGATIVE

## 2014-08-31 ENCOUNTER — Ambulatory Visit (HOSPITAL_COMMUNITY)
Admission: RE | Admit: 2014-08-31 | Discharge: 2014-08-31 | Disposition: A | Discharge status: Home / Self Care | Payer: Federal, State, Local not specified - Other | Source: Home / Self Care | Attending: Psychiatry | Admitting: Psychiatry

## 2014-08-31 ENCOUNTER — Inpatient Hospital Stay (HOSPITAL_COMMUNITY)
Admission: AD | Admit: 2014-08-31 | Discharge: 2014-09-06 | DRG: 885 | Disposition: A | Discharge status: Home / Self Care | Payer: Federal, State, Local not specified - Other | Source: Intra-hospital | Attending: Psychiatry | Admitting: Psychiatry

## 2014-08-31 ENCOUNTER — Encounter (HOSPITAL_COMMUNITY): Payer: Self-pay | Admitting: Emergency Medicine

## 2014-08-31 ENCOUNTER — Emergency Department (HOSPITAL_COMMUNITY)
Admission: EM | Admit: 2014-08-31 | Discharge: 2014-08-31 | Disposition: A | Discharge status: Other Acute Inpatient Hospital | Payer: Self-pay | Attending: Emergency Medicine | Admitting: Emergency Medicine

## 2014-08-31 ENCOUNTER — Encounter (HOSPITAL_COMMUNITY): Payer: Self-pay | Admitting: *Deleted

## 2014-08-31 DIAGNOSIS — F1010 Alcohol abuse, uncomplicated: Secondary | ICD-10-CM | POA: Diagnosis present

## 2014-08-31 DIAGNOSIS — F1020 Alcohol dependence, uncomplicated: Secondary | ICD-10-CM

## 2014-08-31 DIAGNOSIS — F1210 Cannabis abuse, uncomplicated: Secondary | ICD-10-CM | POA: Diagnosis present

## 2014-08-31 DIAGNOSIS — F1220 Cannabis dependence, uncomplicated: Secondary | ICD-10-CM

## 2014-08-31 DIAGNOSIS — F17200 Nicotine dependence, unspecified, uncomplicated: Secondary | ICD-10-CM | POA: Insufficient documentation

## 2014-08-31 DIAGNOSIS — F319 Bipolar disorder, unspecified: Secondary | ICD-10-CM | POA: Diagnosis present

## 2014-08-31 DIAGNOSIS — Z008 Encounter for other general examination: Secondary | ICD-10-CM | POA: Insufficient documentation

## 2014-08-31 DIAGNOSIS — G4700 Insomnia, unspecified: Secondary | ICD-10-CM | POA: Diagnosis present

## 2014-08-31 DIAGNOSIS — F29 Unspecified psychosis not due to a substance or known physiological condition: Secondary | ICD-10-CM | POA: Diagnosis present

## 2014-08-31 DIAGNOSIS — F411 Generalized anxiety disorder: Secondary | ICD-10-CM | POA: Diagnosis present

## 2014-08-31 DIAGNOSIS — F259 Schizoaffective disorder, unspecified: Principal | ICD-10-CM | POA: Diagnosis present

## 2014-08-31 DIAGNOSIS — F250 Schizoaffective disorder, bipolar type: Secondary | ICD-10-CM

## 2014-08-31 DIAGNOSIS — F22 Delusional disorders: Secondary | ICD-10-CM | POA: Insufficient documentation

## 2014-08-31 LAB — COMPREHENSIVE METABOLIC PANEL
ALT: 12 U/L (ref 0–53)
ANION GAP: 12 (ref 5–15)
AST: 15 U/L (ref 0–37)
Albumin: 4.4 g/dL (ref 3.5–5.2)
Alkaline Phosphatase: 37 U/L — ABNORMAL LOW (ref 39–117)
BILIRUBIN TOTAL: 0.3 mg/dL (ref 0.3–1.2)
BUN: 14 mg/dL (ref 6–23)
CO2: 27 meq/L (ref 19–32)
CREATININE: 1.01 mg/dL (ref 0.50–1.35)
Calcium: 9.8 mg/dL (ref 8.4–10.5)
Chloride: 101 mEq/L (ref 96–112)
Glucose, Bld: 85 mg/dL (ref 70–99)
Potassium: 3.7 mEq/L (ref 3.7–5.3)
Sodium: 140 mEq/L (ref 137–147)
Total Protein: 7.7 g/dL (ref 6.0–8.3)

## 2014-08-31 LAB — RAPID URINE DRUG SCREEN, HOSP PERFORMED
Amphetamines: NOT DETECTED
BARBITURATES: NOT DETECTED
Benzodiazepines: NOT DETECTED
Cocaine: NOT DETECTED
Opiates: NOT DETECTED
Tetrahydrocannabinol: NOT DETECTED

## 2014-08-31 LAB — CBC
HCT: 42.7 % (ref 39.0–52.0)
Hemoglobin: 14.7 g/dL (ref 13.0–17.0)
MCH: 30.2 pg (ref 26.0–34.0)
MCHC: 34.4 g/dL (ref 30.0–36.0)
MCV: 87.9 fL (ref 78.0–100.0)
Platelets: 258 10*3/uL (ref 150–400)
RBC: 4.86 MIL/uL (ref 4.22–5.81)
RDW: 13.3 % (ref 11.5–15.5)
WBC: 6.3 10*3/uL (ref 4.0–10.5)

## 2014-08-31 LAB — ETHANOL

## 2014-08-31 LAB — ACETAMINOPHEN LEVEL

## 2014-08-31 LAB — SALICYLATE LEVEL: Salicylate Lvl: <2 mg/dL — ABNORMAL LOW (ref 2.8–20.0)

## 2014-08-31 MED ORDER — QUETIAPINE FUMARATE 50 MG PO TABS
50.0000 mg | ORAL_TABLET | Freq: Every day | ORAL | Status: DC
  Filled 2014-08-31 (×3): qty 1

## 2014-08-31 MED ORDER — MAGNESIUM HYDROXIDE 400 MG/5ML PO SUSP: 30.0000 mL | Freq: Every day | ORAL | Status: DC | PRN

## 2014-08-31 MED ORDER — ALUM & MAG HYDROXIDE-SIMETH 200-200-20 MG/5ML PO SUSP: 30.0000 mL | ORAL | Status: DC | PRN

## 2014-08-31 NOTE — BH Assessment (Signed)
Pt presents to [facility] with C/O Paranoid Ideations. Pt presents cooperative. Consulted with AC Thurman Coyer and Serena Colonel whom are in agreement that patient meets inpatient treatment criteria but will need to be medically cleared. Pt appropriate for a 400 Hall bed once medically cleared.  Spoke with ED Charge nurse Arvilla Meres. Whom was informed that patient will be transferred to [facility] via [facility] for medical clearance. Informed charge nurse that pt's ED nurse will need to contact to contact [facility] at [phone] once patient is medically cleared and pt will be assigned a bed at that time.  [facility] contacted spoke with Phylis who reports that they are in the middle of shift change but driver will be available transport patient to [facility] within the hour.   Glorious Peach, MS, LCASA Assessment Counselor

## 2014-08-31 NOTE — ED Provider Notes (Signed)
CSN: [id number]     Arrival date & time 08/31/14  1641 History   First MD Initiated Contact with Patient 08/31/14 1658     Chief Complaint  Patient presents with  . Medical Clearance  . Depression-already has a bed at [facility]      (Consider location/radiation/quality/duration/timing/severity/associated sxs/prior Treatment) The history is provided by the patient.   patient was sent over from behavioral health hospital for medical clearance. He has had a history of paranoia. Been worse recently. He was seen 2 weeks ago did not want you to start medications or have anything more done at that time. Has been getting worse. He states he used to be on medicine when he was younger but not recently. He denies suicidal or homicidal thoughts. No chest pain. No recent drug use but previously did marijuana somewhat recently. Denies hallucinations. Reportedly has been accepted at [facility]  History reviewed. No pertinent past medical history. No past surgical history on file. Family History  Problem Relation Age of Onset  . Family history unknown: Yes   History  Substance Use Topics  . Smoking status: Heavy Tobacco Smoker -- 0.50 packs/day    Types: Cigarettes  . Smokeless tobacco: Not on file  . Alcohol Use: 0.6 oz/week    1 Shots of liquor per week    Review of Systems  Constitutional: Negative for appetite change.  Respiratory: Negative for shortness of breath.   Gastrointestinal: Negative for abdominal pain.  Genitourinary: Negative for flank pain.  Musculoskeletal: Negative for back pain.  Neurological: Negative for numbness and headaches.  Psychiatric/Behavioral: Negative for suicidal ideas, hallucinations, confusion and decreased concentration. The patient is nervous/anxious. The patient is not hyperactive.       Allergies  Mushroom extract complex; Excedrin back &; Fish allergy; and Onion  Home Medications   Prior to Admission medications   Not on File   BP 124/100  Pulse 70   Temp(Src) 98.1 F (36.7 C) (Oral)  Resp 18  SpO2 100% Physical Exam  Constitutional: He is oriented to person, place, and time. He appears well-developed.  Eyes: Pupils are equal, round, and reactive to light.  Cardiovascular: Normal rate and regular rhythm.   Pulmonary/Chest: Effort normal and breath sounds normal.  Abdominal: Soft.  Musculoskeletal: Normal range of motion.  Neurological: He is alert and oriented to person, place, and time.  Skin: Skin is warm.  Psychiatric:  Patient appears somewhat pressured. He does not appear to be responding to internal stimuli.    ED Course  Procedures (including critical care time) Labs Review Labs Reviewed  COMPREHENSIVE METABOLIC PANEL - Abnormal; Notable for the following:    Alkaline Phosphatase 37 (*)    All other components within normal limits  SALICYLATE LEVEL - Abnormal; Notable for the following:    Salicylate Lvl <2.0 (*)    All other components within normal limits  ACETAMINOPHEN LEVEL  CBC  ETHANOL  URINE RAPID DRUG SCREEN (HOSP PERFORMED)    Imaging Review No results found.   EKG Interpretation None      MDM   Final diagnoses:  Paranoia (psychosis)    Patient with paranoia. Has behavioral health bed.   Juliet Rude. Rubin Payor, MD 09/01/14 [phone]
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
## 2014-08-31 NOTE — Tx Team (Signed)
Initial Interdisciplinary Treatment Plan   PATIENT STRESSORS: Medication change or noncompliance Occupational concerns   PROBLEM LIST: Problem List/Patient Goals Date to be addressed Date deferred Reason deferred Estimated date of resolution  "nervous breakdown" 08-31-14     Anxiety  08-31-14     Depression 08-31-14     Paranoid 08-31-14                                    DISCHARGE CRITERIA:  Improved stabilization in mood, thinking, and/or behavior Need for constant or close observation no longer present Verbal commitment to aftercare and medication compliance  PRELIMINARY DISCHARGE PLAN: Attend aftercare/continuing care group Outpatient therapy Participate in family therapy Return to previous living arrangement  PATIENT/FAMIILY INVOLVEMENT: This treatment plan has been presented to and reviewed with the patient, Austin Gutierrez, and/or family membe.  The patient and family have been given the opportunity to ask questions and make suggestions.  Mickeal Needy 08/31/2014, 10:39 PM

## 2014-08-31 NOTE — ED Notes (Signed)
Pt here as a walk-in from [facility].  Mental health tech at bedside.  Pt here for medical clearance.  Already has a bed at [facility].  C/o depression and anxiety.  Denies SI/HI

## 2014-09-01 ENCOUNTER — Encounter (HOSPITAL_COMMUNITY): Payer: Self-pay | Admitting: Psychiatry

## 2014-09-01 DIAGNOSIS — F29 Unspecified psychosis not due to a substance or known physiological condition: Secondary | ICD-10-CM | POA: Diagnosis present

## 2014-09-01 MED ORDER — HALOPERIDOL 5 MG PO TABS
5.0000 mg | ORAL_TABLET | Freq: Three times a day (TID) | ORAL | Status: DC | PRN
  Administered 2014-09-02: 5 mg via ORAL
  Filled 2014-09-01 (×2): qty 1

## 2014-09-01 MED ORDER — DIPHENHYDRAMINE HCL 25 MG PO CAPS
50.0000 mg | ORAL_CAPSULE | Freq: Three times a day (TID) | ORAL | Status: DC | PRN
  Administered 2014-09-02 – 2014-09-05 (×4): 50 mg via ORAL
  Filled 2014-09-01 (×5): qty 2

## 2014-09-01 MED ORDER — LORAZEPAM 1 MG PO TABS
2.0000 mg | ORAL_TABLET | Freq: Once | ORAL | Status: AC
  Administered 2014-09-01: 2 mg via ORAL

## 2014-09-01 MED ORDER — HALOPERIDOL LACTATE 5 MG/ML IJ SOLN: 5.0000 mg | Freq: Three times a day (TID) | INTRAMUSCULAR | Status: DC | PRN

## 2014-09-01 MED ORDER — HALOPERIDOL 5 MG PO TABS
5.0000 mg | ORAL_TABLET | Freq: Two times a day (BID) | ORAL | Status: DC
  Administered 2014-09-01 – 2014-09-02 (×2): 5 mg via ORAL
  Filled 2014-09-01 (×5): qty 1

## 2014-09-01 MED ORDER — DIVALPROEX SODIUM ER 500 MG PO TB24
500.0000 mg | ORAL_TABLET | Freq: Every evening | ORAL | Status: DC
  Filled 2014-09-01 (×2): qty 1

## 2014-09-01 MED ORDER — LORAZEPAM 1 MG PO TABS
ORAL_TABLET | ORAL | Status: AC
  Administered 2014-09-01: 2 mg via ORAL
  Filled 2014-09-01: qty 2

## 2014-09-01 MED ORDER — BENZTROPINE MESYLATE 0.5 MG PO TABS
0.5000 mg | ORAL_TABLET | Freq: Two times a day (BID) | ORAL | Status: DC
  Administered 2014-09-01 – 2014-09-06 (×10): 0.5 mg via ORAL
  Filled 2014-09-01 (×2): qty 1
  Filled 2014-09-01: qty 28
  Filled 2014-09-01: qty 1
  Filled 2014-09-01: qty 28
  Filled 2014-09-01 (×5): qty 1
  Filled 2014-09-01 (×2): qty 28
  Filled 2014-09-01 (×5): qty 1

## 2014-09-01 MED ORDER — DIPHENHYDRAMINE HCL 50 MG/ML IJ SOLN: 50.0000 mg | Freq: Three times a day (TID) | INTRAMUSCULAR | Status: DC | PRN

## 2014-09-01 MED ORDER — DIPHENHYDRAMINE HCL 25 MG PO CAPS
ORAL_CAPSULE | ORAL | Status: AC
  Administered 2014-09-01: 50 mg via ORAL
  Filled 2014-09-01: qty 2

## 2014-09-01 MED ORDER — HALOPERIDOL 5 MG PO TABS
5.0000 mg | ORAL_TABLET | Freq: Once | ORAL | Status: AC
  Administered 2014-09-01: 5 mg via ORAL
  Filled 2014-09-01: qty 1

## 2014-09-01 MED ORDER — LORAZEPAM 1 MG PO TABS
1.0000 mg | ORAL_TABLET | Freq: Three times a day (TID) | ORAL | Status: DC | PRN
  Administered 2014-09-02 – 2014-09-03 (×2): 1 mg via ORAL
  Filled 2014-09-01 (×3): qty 1

## 2014-09-01 MED ORDER — DIPHENHYDRAMINE HCL 50 MG PO CAPS
50.0000 mg | ORAL_CAPSULE | Freq: Once | ORAL | Status: AC
  Administered 2014-09-01: 50 mg via ORAL
  Filled 2014-09-01: qty 1

## 2014-09-01 MED ORDER — LORAZEPAM 2 MG/ML IJ SOLN: 1.0000 mg | Freq: Three times a day (TID) | INTRAMUSCULAR | Status: DC | PRN
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
## 2014-09-01 NOTE — B[facility] Group Notes (Signed)
[facility] LCSW Group Therapy  09/01/2014 3:13 PM  Type of Therapy:  Group Therapy  Participation Level:  Invited but did not attend.    Gutierrez,Austin 09/01/2014, 3:13 PM

## 2014-09-01 NOTE — Progress Notes (Signed)
Adult Psychoeducational Group Note  Date:  09/01/2014 Time:  10:25 PM  Group Topic/Focus:  Wrap-Up Group:   The focus of this group is to help patients review their daily goal of treatment and discuss progress on daily workbooks.  Participation Level:  Did Not Attend  Participation Quality:  Drowsy  Affect:  Flat and Irritable  Cognitive:  Confused  Insight: Limited  Engagement in Group:  None  Modes of Intervention:  None  Additional Comments:  Pt did not go to group   Ninoshka Wainwright R 09/01/2014, 10:25 PM

## 2014-09-01 NOTE — Plan of Care (Signed)
Problem: Alteration in thought process Goal: STG-Patient is able to follow short directions Outcome: Completed/Met Date Met:  09/01/14 Patient is able to follow directions as given to him by staff members. Patient can understand the directions and follow through.

## 2014-09-01 NOTE — [facility] Group Notes (Signed)
[facility] Group Notes:  (Nursing/MHT/Case Management/Adjunct)  Date:  09/01/2014  Time:  10:42 AM  Type of Therapy:  Nurse Education  Participation Level:  Minimal  Participation Quality:  Attentive  Affect:  Appropriate  Cognitive:  Alert  Insight:  Improving  Engagement in Group:  Engaged  Modes of Intervention:  Discussion  Summary of Progress/Problems: Today's group focused on self care, leisure, and lifestyle changes. Patient filled out self care information sheet. Patient reports that he sees a lot of areas that he needs to improve upon. Patient left group early to speak with MD.   Marzetta Board E 09/01/2014, 10:42 AM
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
## 2014-09-01 NOTE — [facility] Suicide Risk Assessment (Signed)
   Nursing information obtained from:  Patient Demographic factors:  Male;Low socioeconomic status;Unemployed Current Mental Status:  NA Loss Factors:  Decrease in vocational status;Financial problems / change in socioeconomic status Historical Factors:  Family history of suicide;Family history of mental illness or substance abuse Risk Reduction Factors:  Sense of responsibility to family;Living with another person, especially a relative;Positive social support Total Time spent with patient: 20 minutes  CLINICAL FACTORS:   Alcohol/Substance Abuse/Dependencies Currently Psychotic  Psychiatric Specialty Exam: Physical Exam  Constitutional: He is oriented to person, place, and time. He appears well-developed and well-nourished.  HENT:  Head: Normocephalic and atraumatic.  Eyes: Pupils are equal, round, and reactive to light.  Neck: Normal range of motion.  Cardiovascular: Normal rate.   GI: Soft.  Musculoskeletal: Normal range of motion.  Neurological: He is alert and oriented to person, place, and time.  Skin: Skin is warm.  Psychiatric: His mood appears anxious. His affect is labile. His speech is rapid and/or pressured. He is aggressive. Thought content is paranoid and delusional. Cognition and memory are normal. He expresses impulsivity and inappropriate judgment. He expresses no homicidal and no suicidal ideation.    Review of Systems  Constitutional: Negative.   HENT: Negative.   Eyes: Negative.   Respiratory: Negative.   Cardiovascular: Negative.   Gastrointestinal: Negative.   Genitourinary: Negative.   Musculoskeletal: Negative.   Skin: Negative.   Neurological: Negative.   Endo/Heme/Allergies: Negative.   Psychiatric/Behavioral: Negative.     Blood pressure 129/73, pulse 78, temperature 98.5 F (36.9 C), temperature source Oral, resp. rate 20, height  (1.88 m), weight 68.947 kg (152 lb).Body mass index is 19.51 kg/(m^2).    See H&P for Mental status  examination               COGNITIVE FEATURES THAT CONTRIBUTE TO RISK:  Closed-mindedness Polarized thinking Thought constriction (tunnel vision)    SUICIDE RISK:   Minimal: No identifiable suicidal ideation.  Patients presenting with no risk factors but with morbid ruminations; may be classified as minimal risk based on the severity of the depressive symptoms  PLAN OF CARE:  I certify that inpatient services furnished can reasonably be expected to improve the patient's condition.  Austin Gutierrez 09/01/2014, 1:50 PM

## 2014-09-02 MED ORDER — ENSURE COMPLETE PO LIQD
237.0000 mL | Freq: Three times a day (TID) | ORAL | Status: DC
  Administered 2014-09-02 – 2014-09-06 (×10): 237 mL via ORAL

## 2014-09-02 MED ORDER — DIVALPROEX SODIUM ER 500 MG PO TB24
1000.0000 mg | ORAL_TABLET | Freq: Every evening | ORAL | Status: DC
  Administered 2014-09-02 – 2014-09-04 (×3): 1000 mg via ORAL
  Filled 2014-09-02 (×4): qty 2

## 2014-09-02 MED ORDER — HALOPERIDOL 5 MG PO TABS
10.0000 mg | ORAL_TABLET | Freq: Two times a day (BID) | ORAL | Status: DC
  Administered 2014-09-02 – 2014-09-06 (×8): 10 mg via ORAL
  Filled 2014-09-02 (×3): qty 2
  Filled 2014-09-02: qty 56
  Filled 2014-09-02 (×3): qty 2
  Filled 2014-09-02 (×2): qty 56
  Filled 2014-09-02 (×5): qty 2
  Filled 2014-09-02: qty 56

## 2014-09-02 NOTE — [facility] Group Notes (Signed)
[facility] LCSW Aftercare Discharge Planning Group Note   09/02/2014 9:42 AM  Participation Quality:  Minimal  Mood/Affect:  Flat  Depression Rating:    Anxiety Rating:    Thoughts of Suicide:  No Will you contract for safety?   NA  Current AVH:  Denies  Plan for Discharge/Comments:  D states that he thinks the medication is helping.  "I am feeling less anxious."  Denies previous hospitalizations.  We talked about worries, which include no income.  He stated that he was involved with [facility] in past, and would be interested in same again.  We will refer.  He will not sign release as he is still paranoid.  Transportation Means: bus  Supports: sister    [name]

## 2014-09-02 NOTE — Progress Notes (Signed)
  D:Pt observed sleeping in bed with eyes closed. RR even and unlabored. No distress noted. A: 1:1 observation continues for safety  R: pt remains safe  

## 2014-09-02 NOTE — Progress Notes (Signed)
NUTRITION ASSESSMENT  Pt identified as at risk on the Malnutrition Screen Tool  INTERVENTION: 1. Educated patient on the importance of nutrition and encouraged intake of food and beverages. 2. Supplements: Ensure Complete TID  NUTRITION DIAGNOSIS: Unintentional weight loss related to sub-optimal intake as evidenced by pt report.   Goal: Pt to meet >/= 90% of their estimated nutrition needs.  Monitor:  PO intake  Assessment:  Pt presents with psychosis. Patient per ER presented to [facility] with c/o paranoia, depression and anxiety.  - Met with pt who reports only eating "every so often" at home, snacks instead of meals - Said he's lost 20 pounds recently, unsure of exact time frame - Snacking on popcorn during visit - Agreeable to getting Ensure, will order    33 y.o. male  Height: Ht Readings from Last 1 Encounters:  08/31/14 6' 2"  (1.88 m)    Weight: Wt Readings from Last 1 Encounters:  08/31/14 152 lb (68.947 kg)    Weight Hx: Wt Readings from Last 10 Encounters:  08/31/14 152 lb (68.947 kg)    BMI:  Body mass index is 19.51 kg/(m^2). Pt meets criteria for normal weight based on current BMI.  Estimated Nutritional Needs: Kcal: 25-30 kcal/kg Protein: > 1 gram protein/kg Fluid: 1 ml/kcal  Diet Order: General Pt is also offered choice of unit snacks mid-morning and mid-afternoon.  Pt is eating as desired.   Lab results and medications reviewed.    Carlis Stable MS, [place], LDN [phone] Pager [phone] Weekend/After Hours Pager
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
## 2014-09-02 NOTE — Progress Notes (Signed)
D: Pt denies SI/HI/AVH. Pt is pleasant and cooperative. Pt stated he still is paranoid. Pt does talk to staff, but is not willing to interact with other pts on the milieu.   A: Pt was offered support and encouragement. Pt was given scheduled medications. Pt was encourage to attend groups. Q 15 minute checks were done for safety.   R: Pt is taking medication.Pt receptive to treatment and safety maintained on unit.

## 2014-09-02 NOTE — [facility] Group Notes (Signed)
[facility] LCSW Group Therapy  09/02/2014 3:37 PM  Type of Therapy:  Group Therapy  Participation Level:  Minimal  Participation Quality:  Drowsy  Affect:  Flat  Cognitive:  Disorganized  Insight:  Limited  Engagement in Therapy:  Limited  Modes of Intervention:  Discussion, Education, Socialization and Support  Summary of Progress/Problems: Feelings around Relapse. Group members discussed the meaning of relapse and shared personal stories of relapse, how it affected them and others, and how they perceived themselves during this time. Group members were encouraged to identify triggers, warning signs and coping skills used when facing the possibility of relapse. Social supports were discussed and explored in detail. Pt was very hesitate to share information during group. He stated his faith gives him hope and that he hopes everyone in the group finds peace of mind.   Hyatt,Suprena Travaglini 09/02/2014, 3:37 PM

## 2014-09-02 NOTE — Progress Notes (Signed)
[facility] Group Notes:  (Nursing/MHT/Case Management/Adjunct)  Date:  09/02/2014  Time:  9:23 PM  Type of Therapy:  Group Therapy  Participation Level:  Did Not Attend  Participation Quality:  Did Not Attend  Affect:  Did Not Attend  Cognitive:  Did Not Attend  Insight:  None  Engagement in Group:  Did Not Attend  Modes of Intervention:  Socialization and Support  Summary of Progress/Problems: Pt. Was sleeping in bed.  Sondra Come 09/02/2014, 9:23 PM

## 2014-09-03 DIAGNOSIS — F319 Bipolar disorder, unspecified: Secondary | ICD-10-CM

## 2014-09-03 DIAGNOSIS — F259 Schizoaffective disorder, unspecified: Principal | ICD-10-CM

## 2014-09-03 DIAGNOSIS — F1010 Alcohol abuse, uncomplicated: Secondary | ICD-10-CM

## 2014-09-03 DIAGNOSIS — F1210 Cannabis abuse, uncomplicated: Secondary | ICD-10-CM

## 2014-09-03 MED ORDER — NICOTINE POLACRILEX 2 MG MT GUM
CHEWING_GUM | OROMUCOSAL | Status: AC
  Administered 2014-09-03: 08:00:00
  Filled 2014-09-03: qty 1

## 2014-09-03 NOTE — [facility] Group Notes (Signed)
[facility] Group Notes:  (Nursing/MHT/Case Management/Adjunct)  Date:  09/03/2014  Time:  12:37 PM  Type of Therapy:  Psychoeducational Skills- Patient Self Inventory Group  Participation Level:  Minimal  Participation Quality:  Resistant  Affect:  Depressed  Cognitive:  Lacking  Insight:  Lacking  Engagement in Group:  Lacking  Modes of Intervention:  Problem-solving  Summary of Progress/Problems: Pt attended patient self inventory group.   Jacquelyne Balint Shanta 09/03/2014, 12:37 PM

## 2014-09-03 NOTE — Progress Notes (Signed)
[facility] Group Notes:  (Nursing/MHT/Case Management/Adjunct)  Date:  09/03/2014  Time:  8:44 PM  Type of Therapy:  Psychoeducational Skills  Participation Level:  Active  Participation Quality:  Appropriate  Affect:  Appropriate  Cognitive:  Appropriate  Insight:  Appropriate  Engagement in Group:  Engaged  Modes of Intervention:  Education  Summary of Progress/Problems: The patient described his day as having been "okay" overall. The patient states that he had a good visit with his mother this evening and that he enjoyed the groups. As a theme for the day, his coping skill is to go to sleep.    Hazle Coca S 09/03/2014, 8:44 PM

## 2014-09-03 NOTE — Progress Notes (Signed)
Patient ID: Austin Gutierrez, male   DOB: 05/19/1981, 33 y.o.   MRN: [id number] [facility] MD Progress Note  09/03/2014 3:47 PM Rommel Hogston  MRN:  [id number]  Subjective: Demarquis reports, "I'm doing a little bit better. I came here because I was having some disorder".   Objective: Patient seen and chart reviewed. He says he has calmed down a lot. Denies any new issues.  Diagnosis:   DSM5:  Primary psychiatric diagnosis:  Schizoaffective disorder, bipolar type ,multiple episodes ,currently in acute episode   Secondary psychiatric diagnosis:  Cannabis use disorder  Alcohol use disorder   Non -psychiatric diagnosis:  None   Total Time spent with patient: 30 minutes   ADL's:  Intact  Sleep: Fair  Appetite:  Fair   Psychiatric Specialty Exam: Physical Exam  Constitutional: He is oriented to person, place, and time. He appears well-developed and well-nourished.  HENT:  Head: Normocephalic and atraumatic.  Eyes: Pupils are equal, round, and reactive to light.  Neck: Normal range of motion.  Cardiovascular: Normal rate.   Respiratory: Effort normal.  GI: Soft.  Musculoskeletal: Normal range of motion.  Neurological: He is alert and oriented to person, place, and time.  Skin: Skin is warm.  Psychiatric: His speech is normal. His mood appears anxious. His affect is labile. He is aggressive. Thought content is paranoid and delusional. Cognition and memory are normal. He expresses impulsivity. He expresses no homicidal and no suicidal ideation.    Review of Systems  Constitutional: Negative.   HENT: Negative.   Eyes: Negative.   Respiratory: Negative.   Cardiovascular: Negative.   Gastrointestinal: Negative.   Genitourinary: Negative.   Musculoskeletal: Negative.   Skin: Negative.   Psychiatric/Behavioral: Positive for depression. Negative for suicidal ideas and hallucinations. The patient is nervous/anxious.     Blood pressure 119/75, pulse 109, temperature 98.3 F (36.8  C), temperature source Oral, resp. rate 16, height  (1.88 m), weight 68.947 kg (152 lb).Body mass index is 19.51 kg/(m^2).  General Appearance: Casual  Eye Contact::  Minimal  Speech:  Normal Rate  Volume:  Normal  Mood:  Anxious, Dysphoric and Irritable  Affect:  Labile  Thought Process:  Disorganized and Irrelevant  Orientation:  Full (Time, Place, and Person)  Thought Content:  Delusions and Paranoid Ideation  Suicidal Thoughts:  No  Homicidal Thoughts:  No  Memory:  Immediate;   Fair Recent;   Fair Remote;   Fair  Judgement:  Impaired  Insight:  Lacking  Psychomotor Activity:  Increased  Concentration:  Fair  Recall:  [employer] of Knowledge:Fair  Language: Good  Akathisia:  No    AIMS (if indicated):   0  Assets:  Desire for Improvement  Sleep:  Number of Hours: 6.75   Musculoskeletal: Strength & Muscle Tone: within normal limits Gait & Station: normal Patient leans: N/A  Current Medications: Current Facility-Administered Medications  Medication Dose Route Frequency Provider Last Rate Last Dose  . alum & mag hydroxide-simeth (MAALOX/MYLANTA) 200-200-20 MG/5ML suspension 30 mL  30 mL Oral Q4H PRN Nanine Means, NP      . benztropine (COGENTIN) tablet 0.5 mg  0.5 mg Oral BID Jomarie Longs, MD   0.5 mg at 09/03/14 0735  . diphenhydrAMINE (BENADRYL) capsule 50 mg  50 mg Oral Q8H PRN Jomarie Longs, MD   50 mg at 09/02/14 1210   Or  . diphenhydrAMINE (BENADRYL) injection 50 mg  50 mg Intramuscular Q8H PRN Jomarie Longs, MD      .  divalproex (DEPAKOTE ER) 24 hr tablet 1,000 mg  1,000 mg Oral QPM Jomarie Longs, MD   1,000 mg at 09/02/14 1648  . feeding supplement (ENSURE COMPLETE) (ENSURE COMPLETE) liquid 237 mL  237 mL Oral TID BM Tenny Craw, RD   237 mL at 09/03/14 1451  . haloperidol (HALDOL) tablet 10 mg  10 mg Oral BID Jomarie Longs, MD   10 mg at 09/03/14 0735  . haloperidol (HALDOL) tablet 5 mg  5 mg Oral Q8H PRN Jomarie Longs, MD   5 mg at 09/02/14  1210   Or  . haloperidol lactate (HALDOL) injection 5 mg  5 mg Intramuscular Q8H PRN Saramma Eappen, MD      . LORazepam (ATIVAN) tablet 1 mg  1 mg Oral Q8H PRN Jomarie Longs, MD   1 mg at 09/02/14 1210   Or  . LORazepam (ATIVAN) injection 1 mg  1 mg Intramuscular Q8H PRN Saramma Eappen, MD      . magnesium hydroxide (MILK OF MAGNESIA) suspension 30 mL  30 mL Oral Daily PRN Nanine Means, NP        Lab Results:  No results found for this or any previous visit (from the past 48 hour(s)).  Physical Findings: AIMS: Facial and Oral Movements Muscles of Facial Expression: None, normal Lips and Perioral Area: None, normal Jaw: None, normal Tongue: None, normal,Extremity Movements Upper (arms, wrists, hands, fingers): None, normal Lower (legs, knees, ankles, toes): None, normal, Trunk Movements Neck, shoulders, hips: None, normal, Overall Severity Severity of abnormal movements (highest score from questions above): None, normal Incapacitation due to abnormal movements: None, normal Patient's awareness of abnormal movements (rate only patient's report): No Awareness, Dental Status Current problems with teeth and/or dentures?: No Does patient usually wear dentures?: No  CIWA:    COWS:     Treatment Plan Summary: Daily contact with patient to assess and evaluate symptoms and progress in treatment Medication management  Plan: Patient will benefit from continued  inpatient treatment and stabilization.  Continue Depakote ER to 1000 mg po qhs for mood lability,  Haldol 10  mg po BID for psychosis,  prn Haldol ,benadryl,ativan for agitation, Cogentin for EPS.  Obtain Tegretol levels, result pending.  Medical Decision Making Problem Points:  Established problem, stable/improving (1) and Review of last therapy session (1) Data Points:  Order Aims Assessment (2) Review of medication regiment & side effects (2)  I certify that inpatient services furnished can reasonably be expected to improve  the patient's condition.   Sanjuana Kava, PMHNP 09/03/2014, 3:47 PM  I agreed with the findings, treatment and disposition plan of this patient. Kathryne Sharper, MD

## 2014-09-03 NOTE — Progress Notes (Signed)
Patient ID: Austin Gutierrez, male   DOB: March 16, 1981, 33 y.o.   MRN: [id number] D. Patient presents with depressed , irritable mood, affect congruent. Shaman remains very depressed, anxious and irritable. He states '' I just am here to do what I need to do to get better. I need to get my disability, I just think people are treating me differently, like I'm a bad person. I'm not evil or guilty or have done anything bad. I'm just so depressed and I want to get better. ''  A. Support and encouragement provided. Discussed above information with A . Nwoko. Pt declined to complete self inventory today.Marland Kitchen HeHe denies any AH/VH with writer, thoughts remain disorganized at times. R. Patient is safe on the unit. No further voiced concerns at this time. Will continue to monitor q 15 minutes for safety.

## 2014-09-03 NOTE — [facility] Group Notes (Signed)
[facility] Group Notes:  (Clinical Social Work)  09/03/2014  11:15-12:00PM  Summary of Progress/Problems:   The main focus of today's process group was to discuss patients' feelings about hospitalization, the stigma attached to mental health, and sources of motivation to stay well.  We then worked to identify a specific plan to avoid future hospitalizations when discharged from the hospital for this admission.  The patient expressed that he is happy about being in the hospital, that he is getting the treatment needed to get his mood stable.  He contributed well to the discussion at times, and tracked the discussion, appeared to be very attentively listening, nodding his head, raising his hand, etc., throughout.    Type of Therapy:  Group Therapy - Process  Participation Level:  Active  Participation Quality:  Attentive and Sharing  Affect:  Blunted  Cognitive:  Alert  Insight:  Engaged  Engagement in Therapy:  Engaged  Modes of Intervention:  Exploration, Discussion  Ambrose Mantle, LCSW 09/03/2014, 1:11 PM

## 2014-09-03 NOTE — Progress Notes (Signed)
Patient ID: Austin Gutierrez, male   DOB: 06-May-1981, 33 y.o.   MRN: [id number]  D: Pt apologized for the behavior that he exhibited last week. Stated, "I'm a good guy and I know better". Stated that he's feeling much better since being on his meds and plans to continue taking them after discharge. Stated, "whatever meds yall have me on is what I'm going to take". States he also plans to stop working because "he can't handle it". Stated "I've been working for a long time".   A:  Support and encouragement was offered. 15 min checks continued for safety.  R: Pt remains safe.

## 2014-09-03 NOTE — Plan of Care (Signed)
Problem: Alteration in thought process Goal: LTG-Patient verbalizes understanding importance med regimen (Patient verbalizes understanding of importance of medication regimen and need to continue outpatient care.)  Outcome: Progressing Pt stated he would take any medication that was scheduled for the evening.  Problem: Alteration in thought process Goal: STG-Patient is able to discuss thoughts with staff Outcome: Progressing Pt stated he was feeling a little better, but still paranoid.

## 2014-09-03 NOTE — Progress Notes (Signed)
Patient ID: Austin Gutierrez, male   DOB: Apr 29, 1981, 33 y.o.   MRN: [id number] D. Patient before going outside reported feeling dizzy and hot and like he had a fever. He requested to stay back from recreation stating '' I don't think I should go down feeling like this. '' His vitals were obtained and all WNL. He was given gatorade and states '' oh maybe it's just me''  Pt then anxious stating '' my mother didn't answer my phone call I know something is not right and I need to get out of here. '' Behavior has declined, and staff suspect attention seeking behavior.  Pt then anxious, restless requesting to sign 72 hour AMA. Pt signed form at 1700. Pt then requested medication ''for my nerves'' he received prn medication for agitation. Patient then proceeded to walk down hallway and was found in hallway sitting in the floor. He states ''I'm not sure what happened. '' He reported feeling dizzy and lightheaded, denies any injury and states ''I'm fine. '' Vitals taken again and all WNL. Will continue to monitor q 15 minutes for safety, and encouraged po fluids, gatorade provided. Pt requested to stay back from dinner. Staff will allow meal in room . Will continue to monitor closely.

## 2014-09-03 NOTE — [facility] Group Notes (Addendum)
[facility] Group Notes:  (Nursing/MHT/Case Management/Adjunct)  Date:  09/03/2014  Time:  12:35 PM  Type of Therapy:  Psychoeducational Skills- Healthy Coping Skills Group  Participation Level:  Minimal  Participation Quality:  Resistant  Affect:  Depressed  Cognitive:  Lacking  Insight:  Lacking  Engagement in Group:  Lacking  Modes of Intervention:  Problem-solving  Summary of Progress/Problems: Pt attended healthy coping skills group.   Jacquelyne Balint Shanta 09/03/2014, 12:35 PM

## 2014-09-04 NOTE — Progress Notes (Addendum)
Patient ID: Austin Gutierrez, male   DOB: [birth year], 33 y.o.   MRN: [id number] D)  Has been rather quiet, poor eye contact, fidgety when being spoken to,  but has been out on the hall and participating in the milieu.  Stated he thought he would probably be leaving tomorrow, his mother could come then, and wanted to take him to start his disability proceedings.  Feels he can be compliant with his meds, follow up.  Attended group this evening, came to med window afterward for hs meds before going to bed. A)  Will continue to monitor for safety, support, continue POC R)  Safety maintained.

## 2014-09-04 NOTE — Progress Notes (Signed)
[facility] Group Notes:  (Nursing/MHT/Case Management/Adjunct)  Date:  09/04/2014  Time:  9:55 PM  Type of Therapy:  Psychoeducational Skills  Participation Level:  Active  Participation Quality:  Appropriate  Affect:  Appropriate  Cognitive:  Appropriate  Insight:  Appropriate  Engagement in Group:  Improving  Modes of Intervention:  Education  Summary of Progress/Problems: The patient described his day as having been "okay". He states that he had a good visit with his mother this evening. No additional details were provided. As a theme for the day, his support system will consist of his parents.   Hazle Coca S 09/04/2014, 9:55 PM

## 2014-09-04 NOTE — Progress Notes (Signed)
Patient ID: Austin Gutierrez, male   DOB: [birth year], 33 y.o.   MRN: [id number] Patient ID: Austin Gutierrez, male   DOB: 1981-09-06, 33 y.o.   MRN: [id number] [facility] MD Progress Note  09/04/2014 1:16 PM Austin Gutierrez  MRN:  [id number]  Subjective: [name] reports, "Everything is going well. I feel a lot better".   Objective: Patient seen and chart reviewed. [name] says he is doing better. Although says he feels tired today, he is participating in groups. He currently denies any SIHI, AVH. He says he plans on filing for his disability once he gets out of here. He wants to know how we can help him with the filing. [name] is instructed to ask for his medical records from the medical records when that comes.   Diagnosis:   DSM5:  Primary psychiatric diagnosis:  Schizoaffective disorder, bipolar type ,multiple episodes ,currently in acute episode   Secondary psychiatric diagnosis:  Cannabis use disorder  Alcohol use disorder   Non -psychiatric diagnosis:  None   Total Time spent with patient: 30 minutes   ADL's:  Intact  Sleep: Fair  Appetite:  Fair   Psychiatric Specialty Exam: Physical Exam  Constitutional: He is oriented to person, place, and time. He appears well-developed and well-nourished.  HENT:  Head: Normocephalic and atraumatic.  Eyes: Pupils are equal, round, and reactive to light.  Neck: Normal range of motion.  Cardiovascular: Normal rate.   Respiratory: Effort normal.  GI: Soft.  Musculoskeletal: Normal range of motion.  Neurological: He is alert and oriented to person, place, and time.  Skin: Skin is warm.  Psychiatric: His speech is normal. His mood appears anxious. His affect is labile. He is aggressive. Thought content is paranoid and delusional. Cognition and memory are normal. He expresses impulsivity. He expresses no homicidal and no suicidal ideation.    Review of Systems  Constitutional: Negative.   HENT: Negative.   Eyes: Negative.    Respiratory: Negative.   Cardiovascular: Negative.   Gastrointestinal: Negative.   Genitourinary: Negative.   Musculoskeletal: Negative.   Skin: Negative.   Psychiatric/Behavioral: Positive for depression. Negative for suicidal ideas and hallucinations. The patient is nervous/anxious.     Blood pressure 118/67, pulse 85, temperature 98.1 F (36.7 C), temperature source Oral, resp. rate 18, height  (1.88 m), weight 68.947 kg (152 lb), SpO2 100.00%.Body mass index is 19.51 kg/(m^2).  General Appearance: Casual  Eye Contact::  Minimal  Speech:  Normal Rate  Volume:  Normal  Mood:  Anxious, Dysphoric and Irritable  Affect:  Labile  Thought Process:  Logical  Orientation:  Full (Time, Place, and Person)  Thought Content:  Rumination  Suicidal Thoughts:  No  Homicidal Thoughts:  No  Memory:  Immediate;   Fair Recent;   Fair Remote;   Fair  Judgement:  Impaired  Insight:  Lacking  Psychomotor Activity:  Increased  Concentration:  Fair  Recall:  [employer] of Knowledge:Fair  Language: Good  Akathisia:  No    AIMS (if indicated):   0  Assets:  Desire for Improvement  Sleep:  Number of Hours: 6.75   Musculoskeletal: Strength & Muscle Tone: within normal limits Gait & Station: normal Patient leans: N/A  Current Medications: Current Facility-Administered Medications  Medication Dose Route Frequency Provider Last Rate Last Dose  . alum & mag hydroxide-simeth (MAALOX/MYLANTA) 200-200-20 MG/5ML suspension 30 mL  30 mL Oral Q4H PRN Nanine Means, NP      . benztropine (COGENTIN) tablet 0.5 mg  0.5  mg Oral BID Jomarie Longs, MD   0.5 mg at 09/04/14 0744  . diphenhydrAMINE (BENADRYL) capsule 50 mg  50 mg Oral Q8H PRN Jomarie Longs, MD   50 mg at 09/03/14 1702   Or  . diphenhydrAMINE (BENADRYL) injection 50 mg  50 mg Intramuscular Q8H PRN Saramma Eappen, MD      . divalproex (DEPAKOTE ER) 24 hr tablet 1,000 mg  1,000 mg Oral QPM Saramma Eappen, MD   1,000 mg at 09/03/14 1703   . feeding supplement (ENSURE COMPLETE) (ENSURE COMPLETE) liquid 237 mL  237 mL Oral TID BM Tenny Craw, RD   237 mL at 09/04/14 1400  . haloperidol (HALDOL) tablet 10 mg  10 mg Oral BID Jomarie Longs, MD   10 mg at 09/04/14 0744  . haloperidol (HALDOL) tablet 5 mg  5 mg Oral Q8H PRN Jomarie Longs, MD   5 mg at 09/02/14 1210   Or  . haloperidol lactate (HALDOL) injection 5 mg  5 mg Intramuscular Q8H PRN Saramma Eappen, MD      . LORazepam (ATIVAN) tablet 1 mg  1 mg Oral Q8H PRN Jomarie Longs, MD   1 mg at 09/03/14 1703   Or  . LORazepam (ATIVAN) injection 1 mg  1 mg Intramuscular Q8H PRN Saramma Eappen, MD      . magnesium hydroxide (MILK OF MAGNESIA) suspension 30 mL  30 mL Oral Daily PRN Nanine Means, NP        Lab Results:  No results found for this or any previous visit (from the past 48 hour(s)).  Physical Findings: AIMS: Facial and Oral Movements Muscles of Facial Expression: None, normal Lips and Perioral Area: None, normal Jaw: None, normal Tongue: None, normal,Extremity Movements Upper (arms, wrists, hands, fingers): None, normal Lower (legs, knees, ankles, toes): None, normal, Trunk Movements Neck, shoulders, hips: None, normal, Overall Severity Severity of abnormal movements (highest score from questions above): None, normal Incapacitation due to abnormal movements: None, normal Patient's awareness of abnormal movements (rate only patient's report): No Awareness, Dental Status Current problems with teeth and/or dentures?: No Does patient usually wear dentures?: No  CIWA:    COWS:     Treatment Plan Summary: Daily contact with patient to assess and evaluate symptoms and progress in treatment Medication management  Plan: Patient will benefit from continued  inpatient treatment for crisis management and mood stabilization Encourage to participate in group and individual sessions Continue medication management/ and review as needed, Continue Cogentin 0.5 mg for  prevention of EPS, Depakote 100 mg for mood stabilization and Haldol 10 mg po qhs for mood control Discharge  Plan in progress Address health issues as needed.   Medical Decision Making Problem Points:  Established problem, stable/improving (1) and Review of last therapy session (1) Data Points:  Order Aims Assessment (2) Review of medication regiment & side effects (2)  I certify that inpatient services furnished can reasonably be expected to improve the patient's condition.   Armandina Stammer I, PMHNP 09/04/2014, 1:16 PM   I agreed with the findings, treatment and disposition plan of this patient. Kathryne Sharper, MD

## 2014-09-04 NOTE — Progress Notes (Signed)
Patient ID: Austin Gutierrez, male   DOB: 1981-07-04, 33 y.o.   MRN: [id number] D. Patient presents with depressed mood affect congruent. Tou states '' I'm feeling good today, doing so much better, the meds are working . '' He reports he had a good visit with his mother last night, and denies any dizziness or physical concerns. He denies any SI/HI. He is noted to smile and joke with staff this am. A. Support and encouragement provided. Discussed above information with A . Nwoko. Pt declined to complete self inventory again today. R. Patient is safe on the unit. No further voiced concerns at this time. In no acute distress. Will continue to monitor q 15 minutes for safety.

## 2014-09-04 NOTE — [facility] Group Notes (Signed)
[facility] Group Notes:  (Nursing/MHT/Case Management/Adjunct)  Date:  09/04/2014  Time:  11:28 AM  Type of Therapy:  Psychoeducational Skills- Patient Self Inventory Group  Participation Level:  Did Not Attend   Type of Therapy:  Psychoeducational Skills-Healthy Support Systems  Participation Level:  Did Not Attend   Buford Dresser 09/04/2014, 11:28 AM

## 2014-09-04 NOTE — [facility] Group Notes (Signed)
[facility] Group Notes:  (Clinical Social Work)  09/04/2014   11:15am-12:00pm  Summary of Progress/Problems:  The main focus of today's process group was to listen to a variety of genres of music and to identify that different types of music provoke different responses.  The patient then was able to identify personally what was soothing for them, as well as energizing.  Handouts were used to record feelings evoked, as well as how patient can personally use this knowledge in sleep habits, with depression, and with other symptoms.  The patient expressed understanding of concepts, as well as knowledge of how each type of music affected him and how this can be used at home as a wellness/recovery tool. He was fully engaged from the beginning to the end of group, and appeared fully aware of his own personal reactions to various pieces of music.  Type of Therapy:  Music Therapy   Participation Level:  Active  Participation Quality:  Attentive and Sharing  Affect:  Appropriate  Cognitive:  Oriented  Insight:  Engaged  Engagement in Therapy:  Engaged  Modes of Intervention:   Activity, Exploration  Ambrose Mantle, LCSW 09/04/2014, 12:30pm

## 2014-09-04 NOTE — Progress Notes (Signed)
Patient ID: Austin Gutierrez, male   DOB: 20-Sep-1981, 33 y.o.   MRN: [id number] D)  Has been sleeping tonight, eyes closed, resting quietly, ressp reg, unlabored, no c/o's voiced.  A)  Will continue to monitor q 15 minutes for safety, continue POC R)  Safety maintained.

## 2014-09-05 DIAGNOSIS — F1220 Cannabis dependence, uncomplicated: Secondary | ICD-10-CM

## 2014-09-05 DIAGNOSIS — F1020 Alcohol dependence, uncomplicated: Secondary | ICD-10-CM

## 2014-09-05 LAB — VALPROIC ACID LEVEL: VALPROIC ACID LVL: 63.7 ug/mL (ref 50.0–100.0)

## 2014-09-05 MED ORDER — DIVALPROEX SODIUM ER 500 MG PO TB24
1250.0000 mg | ORAL_TABLET | Freq: Every evening | ORAL | Status: DC
  Administered 2014-09-05: 1250 mg via ORAL
  Filled 2014-09-05 (×2): qty 1

## 2014-09-05 NOTE — Progress Notes (Addendum)
Patient ID: Austin Gutierrez, male   DOB: [birth year], 33 y.o.   MRN: [id number] Patient ID: Austin Gutierrez, male   DOB: 1981-02-10, 32 y.o.   MRN: [id number] [facility] MD Progress Note  09/05/2014 11:15 AM Wesson Stith  MRN:  [id number]  Subjective: Malikai reports, "I feel better today"  Objective: Patient seen and chart reviewed. Willson reports doing better. He denies any psychosis today and reports good effect from medications.  He currently denies any SIHI, AVH.  Spoke to Ms.[name] at [phone] -(Mother) - Per Mother she feels that her son is doing much better and wants [place] to discharge him today or tomorrow. Per her she would support him to follow up with his outpatient psychiatrist and take his medications. Discussed with her that we would check his Depakote level and if he continues to be stable ,could possibly be discharged tomorrow.  Diagnosis:   DSM5:  Primary psychiatric diagnosis:  Schizoaffective disorder, bipolar type ,multiple episodes ,currently in acute episode   Secondary psychiatric diagnosis:  Cannabis use disorder  Alcohol use disorder   Non -psychiatric diagnosis:  None   Total Time spent with patient: 30 minutes   ADL's:  Intact  Sleep: Good  Appetite:  Fair   Psychiatric Specialty Exam: Physical Exam  Constitutional: He is oriented to person, place, and time. He appears well-developed and well-nourished.  HENT:  Head: Normocephalic and atraumatic.  Eyes: Pupils are equal, round, and reactive to light.  Neck: Normal range of motion.  Cardiovascular: Normal rate.   Respiratory: Effort normal.  GI: Soft.  Musculoskeletal: Normal range of motion.  Neurological: He is alert and oriented to person, place, and time.  Skin: Skin is warm.  Psychiatric: His speech is normal and behavior is normal. Judgment normal. His mood appears anxious. Thought content is paranoid (improved). Cognition and memory are normal. He expresses no homicidal and no  suicidal ideation.    Review of Systems  Constitutional: Negative.   HENT: Negative.   Eyes: Negative.   Respiratory: Negative.   Cardiovascular: Negative.   Gastrointestinal: Negative.   Genitourinary: Negative.   Musculoskeletal: Negative.   Skin: Negative.   Psychiatric/Behavioral: Positive for depression (improving). Negative for suicidal ideas and hallucinations. The patient is nervous/anxious.     Blood pressure 122/72, pulse 84, temperature 98 F (36.7 C), temperature source Oral, resp. rate 16, height  (1.88 m), weight 68.947 kg (152 lb), SpO2 100.00%.Body mass index is 19.51 kg/(m^2).  General Appearance: Casual  Eye Contact::  Minimal  Speech:  Normal Rate  Volume:  Normal  Mood:  Anxious  Affect:  Appropriate  Thought Process:  Logical  Orientation:  Full (Time, Place, and Person)  Thought Content:  Paranoid Ideation, Rumination and is improving  Suicidal Thoughts:  No  Homicidal Thoughts:  No  Memory:  Immediate;   Fair Recent;   Fair Remote;   Fair  Judgement:  Impaired  Insight:  Lacking  Psychomotor Activity:  Increased  Concentration:  Fair  Recall:  [employer] of Knowledge:Fair  Language: Good  Akathisia:  No    AIMS (if indicated):   0  Assets:  Desire for Improvement  Sleep:  Number of Hours: 6.5   Musculoskeletal: Strength & Muscle Tone: within normal limits Gait & Station: normal Patient leans: N/A  Current Medications: Current Facility-Administered Medications  Medication Dose Route Frequency Provider Last Rate Last Dose  . alum & mag hydroxide-simeth (MAALOX/MYLANTA) 200-200-20 MG/5ML suspension 30 mL  30 mL Oral Q4H PRN Catha Nottingham  Lord, NP      . benztropine (COGENTIN) tablet 0.5 mg  0.5 mg Oral BID Jomarie Longs, MD   0.5 mg at 09/05/14 0751  . diphenhydrAMINE (BENADRYL) capsule 50 mg  50 mg Oral Q8H PRN Jomarie Longs, MD   50 mg at 09/04/14 2055   Or  . diphenhydrAMINE (BENADRYL) injection 50 mg  50 mg Intramuscular Q8H PRN Sandrina Heaton, MD      . divalproex (DEPAKOTE ER) 24 hr tablet 1,000 mg  1,000 mg Oral QPM Demarco Bacci, MD   1,000 mg at 09/04/14 1600  . feeding supplement (ENSURE COMPLETE) (ENSURE COMPLETE) liquid 237 mL  237 mL Oral TID BM Tenny Craw, RD   237 mL at 09/05/14 1001  . haloperidol (HALDOL) tablet 10 mg  10 mg Oral BID Jomarie Longs, MD   10 mg at 09/05/14 0751  . haloperidol (HALDOL) tablet 5 mg  5 mg Oral Q8H PRN Jomarie Longs, MD   5 mg at 09/02/14 1210   Or  . haloperidol lactate (HALDOL) injection 5 mg  5 mg Intramuscular Q8H PRN Shavette Shoaff, MD      . LORazepam (ATIVAN) tablet 1 mg  1 mg Oral Q8H PRN Jomarie Longs, MD   1 mg at 09/03/14 1703   Or  . LORazepam (ATIVAN) injection 1 mg  1 mg Intramuscular Q8H PRN Jomarie Longs, MD      . magnesium hydroxide (MILK OF MAGNESIA) suspension 30 mL  30 mL Oral Daily PRN Nanine Means, NP        Lab Results:  Results for orders placed during the hospital encounter of 08/31/14 (from the past 48 hour(s))  VALPROIC ACID LEVEL     Status: None   Collection Time    09/05/14  6:30 AM      Result Value Ref Range   Valproic Acid Lvl 63.7  50.0 - 100.0 ug/mL   Comment: Performed at [facility]    Physical Findings: AIMS: Facial and Oral Movements Muscles of Facial Expression: None, normal Lips and Perioral Area: None, normal Jaw: None, normal Tongue: None, normal,Extremity Movements Upper (arms, wrists, hands, fingers): None, normal Lower (legs, knees, ankles, toes): None, normal, Trunk Movements Neck, shoulders, hips: None, normal, Overall Severity Severity of abnormal movements (highest score from questions above): None, normal Incapacitation due to abnormal movements: None, normal Patient's awareness of abnormal movements (rate only patient's report): No Awareness, Dental Status Current problems with teeth and/or dentures?: No Does patient usually wear dentures?: No  CIWA:    COWS:     Treatment Plan Summary: Daily  contact with patient to assess and evaluate symptoms and progress in treatment Medication management  Plan: Patient will benefit from continued  inpatient treatment for crisis management and mood stabilization Encourage to participate in group and individual sessions Continue medication management/ and review as needed, Continue Cogentin 0.5 mg for prevention of EPS, and Haldol 10 mg po bid  for mood control.  Will increase Depakote to 1250 mg po qpm for mood lability. Depakote level -is as below. Results for SEQUOYAH, COUNTERMAN (MRN [id number]) as of 09/05/2014 11:16  Ref. Range 09/05/2014 06:30  Valproic Acid Lvl Latest Range: 50.0-100.0 ug/mL 63.7   Discharge  Plan in progress Address health issues as needed.   Medical Decision Making Problem Points:  Established problem, stable/improving (1) and Review of last therapy session (1) Data Points:  Order Aims Assessment (2) Review of medication regiment & side effects (2)  I certify  that inpatient services furnished can reasonably be expected to improve the patient's condition.   Ankita Newcomer,  09/05/2014, 11:15 AM

## 2014-09-05 NOTE — [facility] Suicide Risk Assessment (Signed)
[facility] INPATIENT:  Family/Significant Other Suicide Prevention Education  Suicide Prevention Education:  Education Completed; No one has been identified by the patient as the family member/significant other with whom the patient will be residing, and identified as the person(s) who will aid the patient in the event of a mental health crisis (suicidal ideations/suicide attempt).  With written consent from the patient, the family member/significant other has been provided the following suicide prevention education, prior to the and/or following the discharge of the patient.  The suicide prevention education provided includes the following:  Suicide risk factors  Suicide prevention and interventions  National Suicide Hotline telephone number  [facility] assessment telephone number  [facility] Emergency Assistance 911  [facility] and/or Residential Mobile Crisis Unit telephone number  Request made of family/significant other to:  Remove weapons (e.g., guns, rifles, knives), all items previously/currently identified as safety concern.    Remove drugs/medications (over-the-counter, prescriptions, illicit drugs), all items previously/currently identified as a safety concern.  The family member/significant other verbalizes understanding of the suicide prevention education information provided.  The family member/significant other agrees to remove the items of safety concern listed above. The patient did not endorse SI at the time of admission, nor did the patient c/o SI during the stay here.  SPE not required.   Daryel Gerald B 09/05/2014, 3:41 PM

## 2014-09-05 NOTE — [facility] Group Notes (Signed)
[place] LCSW Aftercare Discharge Planning Group Note   09/05/2014 10:12 AM  Participation Quality:  Appropriate   Mood/Affect:  Appropriate  Depression Rating:  0  Anxiety Rating:  0  Thoughts of Suicide:  No Will you contract for safety?   NA  Current AVH:  No  Plan for Discharge/Comments:  Pt reports that he is sleeping well and thinks that his meds are work. Pt states that he will be following up at [facility] for mental health services and plans to return home to live with his cousins at d/c. Pt hoping to d/c in the next day or two.   Transportation Means: bus or family member   Supports: cousins; some family   Austin Gutierrez, Austin Gutierrez

## 2014-09-05 NOTE — Tx Team (Signed)
  Interdisciplinary Treatment Plan Update   Date Reviewed:  09/05/2014  Time Reviewed:  11:34 AM  Progress in Treatment:   Attending groups: Yes Participating in groups: Yes Taking medication as prescribed: Yes  Tolerating medication: Yes Family/Significant other contact made: Yes  Patient understands diagnosis: Yes  Discussing patient identified problems/goals with staff: Yes  See initial care plan Medical problems stabilized or resolved: Yes Denies suicidal/homicidal ideation: Yes  In tx team Patient has not harmed self or others: Yes  For review of initial/current patient goals, please see plan of care.  Estimated Length of Stay:  Likely d/c tomorrow  Reason for Continuation of Hospitalization:   New Problems/Goals identified:  N/A  Discharge Plan or Barriers:   return home, follow up outpt  Additional Comments:  Attendees:  Signature: Ivin Booty, MD 09/05/2014 11:34 AM   Signature: Richelle Ito, LCSW 09/05/2014 11:34 AM  Signature:  09/05/2014 11:34 AM  Signature:  09/05/2014 11:34 AM  Signature: Liborio Nixon, RN 09/05/2014 11:34 AM  Signature:  09/05/2014 11:34 AM  Signature:   09/05/2014 11:34 AM  Signature:    Signature:    Signature:    Signature:    Signature:    Signature:      Scribe for Treatment Team:   Richelle Ito, LCSW  09/05/2014 11:34 AM

## 2014-09-05 NOTE — Progress Notes (Signed)
Patient ID: Austin Gutierrez, male   DOB: 1981-10-06, 33 y.o.   MRN: [id number]   D: Pt informed the writer that his "mom will be picking him up tomorrow". Pt stated he plans to continue taking his meds, understanding that he needs them to function properly. Pt stated he's not sure at this time where he plans to live, stated either with his mom or cousin.   A:  Support and encouragement was offered. 15 min checks continued for safety.  R: Pt remains safe.

## 2014-09-05 NOTE — Progress Notes (Signed)
D: Pt presents with flat affect. Pt appropriate and cooperative this morning and afternoon. Pt has been visible on the milieu and  engages with others. Pt denies SI/HI/AVH. Pt denies depression, paranoia and racing thoughts. No complaints verbalized by pt today. Pt reported no side effects to his meds and that he is tolerating his meds well. Pt compliant with taking meds and attending groups.  A: Medications administered as ordered per MD. Verbal support given. Pt encouraged to attend groups. Pt encouraged to report any worsening symptom to RN. 15 minute checks performed for safety. R: Pt requesting to be discharged home and plans to live with his cousin. Pt receptive to treatment. Pt safety maintained at this time.

## 2014-09-05 NOTE — [facility] Group Notes (Signed)
[facility] LCSW Group Therapy  09/05/2014 1:15 pm  Type of Therapy: Process Group Therapy  Participation Level:  Active  Participation Quality:  Appropriate  Affect:  Flat  Cognitive:  Oriented  Insight:  Improving  Engagement in Group:  Limited  Engagement in Therapy:  Limited  Modes of Intervention:  Activity, Clarification, Education, Problem-solving and Support  Summary of Progress/Problems: Today's group addressed the issue of overcoming obstacles.  Patients were asked to identify their biggest obstacle post d/c that stands in the way of their on-going success, and then problem solve as to how to manage this. Kenshin was unable to identify anything that he has overcome in his life already.  He identified a current barrier that his family is overbearing and wants to make decisions for him.  Others gave him feedback that if he does not need to depend on them for financial support, he should distance himself from them and show them that he is able to make good decisions for himself.  He liked that feedback.  He gave feedback to another patient that loving himself needed to be his priority.  [name] 09/05/2014   2:51 PM

## 2014-09-06 MED ORDER — DIVALPROEX SODIUM ER 250 MG PO TB24: 1250.0000 mg | ORAL_TABLET | Freq: Every evening | ORAL | Status: DC

## 2014-09-06 MED ORDER — BENZTROPINE MESYLATE 0.5 MG PO TABS: 0.5000 mg | ORAL_TABLET | Freq: Two times a day (BID) | ORAL | Status: DC

## 2014-09-06 MED ORDER — DIVALPROEX SODIUM ER 250 MG PO TB24
1250.0000 mg | ORAL_TABLET | Freq: Every day | ORAL | Status: DC
  Filled 2014-09-06: qty 70

## 2014-09-06 MED ORDER — HALOPERIDOL 10 MG PO TABS: 10.0000 mg | ORAL_TABLET | Freq: Two times a day (BID) | ORAL | Status: DC

## 2014-09-06 NOTE — Discharge Instructions (Signed)
Schizoaffective Disorder Schizoaffective disorder (ScAD) is a mental illness. It causes symptoms that are a mixture of schizophrenia (a psychotic disorder) and an affective (mood) disorder. The schizophrenic symptoms may include delusions, hallucinations, or odd behavior. The mood symptoms may be similar to major depression or bipolar disorder. ScAD may interfere with personal relationships or normal daily activities. People with ScAD are at increased risk for job loss, social isolation,physical health problems, anxiety and substance use disorders, and suicide. ScAD usually occurs in cycles. Periods of severe symptoms are followed by periods of less severe symptoms or improvement. The illness affects men and women equally but usually appears at an earlier age (teenage or early adult years) in men. People who have family members with schizophrenia, bipolar disorder, or ScAD are at higher risk of developing ScAD. SYMPTOMS  At any one time, people with ScAD may have psychotic symptoms only or both psychotic and mood symptoms. The psychotic symptoms include one or more of the following:  Hearing, seeing, or feeling things that are not there (hallucinations).   Having fixed, false beliefs (delusions). The delusions usually are of being attacked, harassed, cheated, persecuted, or conspired against (paranoid delusions).  Speaking in a way that makes no sense to others (disorganized speech). The psychotic symptoms of ScAD may also include confusing or odd behavior or any of the negative symptoms of schizophrenia. These include loss of motivation for normal daily activities, such as bathing or grooming, withdrawal from other people, and lack of emotions.  The mood symptoms of ScAD occur more often than not. They resemble major depressive disorder or bipolar mania. Symptoms of major depression include depressed mood and four or more of the following:  Loss of interest in usually pleasurable activities  (anhedonia).  Sleeping more or less than normal.  Feeling worthless or excessively guilty.  Lack of energy or motivation.  Trouble concentrating.  Eating more or less than usual.  Thinking a lot about death or suicide. Symptoms of bipolar mania include abnormally elevated or irritable mood and increased energy or activity, plus three or more of the following:   More confidence than normal or feeling that you are able to do anything (grandiosity).  Feeling rested with less sleep than normal.   Being easily distracted.   Talking more than usual or feeling pressured to keep talking.   Feeling that your thoughts are racing.  Engaging in high-risk activities such as buying sprees or foolish business decisions. DIAGNOSIS  ScAD is diagnosed through an assessment by your health care provider. Your health care provider will observe and ask questions about your thoughts, behavior, mood, and ability to function in daily life. Your health care provider may also ask questions about your medical history and use of drugs, including prescription medicines. Your health care provider may also order blood tests and imaging exams. Certain medical conditions and substances can cause symptoms that resemble ScAD. Your health care provider may refer you to a mental health specialist for evaluation.  ScAD is divided into two types. The depressive type is diagnosed if your mood symptoms are limited to major depression. The bipolar type is diagnosed if your mood symptoms are manic or a mixture of manic and depressive symptoms TREATMENT  ScAD is usually a lifelong illness. Long-term treatment is necessary. The following treatments are available:  Medicine. Different types of medicine are used to treat ScAD. The exact combination depends on the type and severity of your symptoms. Antipsychotic medicine is used to control psychotic symptomssuch as delusions, paranoia,   and hallucinations. Mood stabilizers can  even the highs and lows of bipolar manic mood swings. Antidepressant medicines are used to treat major depressive symptoms.  Counseling or talk therapy. Individual, group, or family counseling may be helpful in providing education, support, and guidance. Many people with ScAD also benefit from social skills and job skills (vocational) training. A combination of medicine and counseling is usually best for managing the disorder over time. A procedure in which electricity is applied to the brain through the scalp (electroconvulsive therapy) may be used to treat people with severe manic symptoms that do not respond to medicine and counseling. HOME CARE INSTRUCTIONS   Take all your medicine as prescribed.  Check with your health care provider before starting new prescription or over-the-counter medicines.  Keep all follow up appointments with your health care provider. SEEK MEDICAL CARE IF:   If you are not able to take your medicines as prescribed.  If your symptoms get worse. SEEK IMMEDIATE MEDICAL CARE IF:   You have serious thoughts about hurting yourself or others. Document Released: 04/14/2007 Document Revised: 04/18/2014 Document Reviewed: 07/16/2013 ExitCare Patient Information 2015 [employer]. This information is not intended to replace advice given to you by your health care provider. Make sure you discuss any questions you have with your health care provider.  

## 2014-09-06 NOTE — Progress Notes (Signed)
D: Patient resting in bed with eyes closed.  Respirations even and unlabored.  Patient appears to be in no apparent distress. A: Staff to monitor Q 15 mins for safety.   R:Patient remains safe on the unit.  

## 2014-09-06 NOTE — Progress Notes (Signed)
Discharge note: Pt received both written and verbal discharge instructions. Pt agreed to f/u appt and med regimen. Pt verbalized understanding of discharge instructions. Pt agreed to f/u appt and med regimen. Pt received sample meds, prescriptions and belongings. Pt safely left [facility].
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
## 2014-09-06 NOTE — [facility] Suicide Risk Assessment (Signed)
   Demographic Factors:  Male and Unemployed  Total Time spent with patient: 20 minutes  Psychiatric Specialty Exam: Physical Exam  Constitutional: He is oriented to person, place, and time. He appears well-developed and well-nourished.  HENT:  Head: Normocephalic and atraumatic.  Neck: Normal range of motion. Neck supple.  Respiratory: Effort normal.  GI: Soft.  Musculoskeletal: Normal range of motion.  Neurological: He is alert and oriented to person, place, and time.  Skin: Skin is warm.  Psychiatric: He has a normal mood and affect. His speech is normal and behavior is normal. Judgment normal. Thought content is not paranoid. Cognition and memory are normal. He expresses no homicidal and no suicidal ideation.    Review of Systems  Constitutional: Negative.   HENT: Negative.   Eyes: Negative.   Respiratory: Negative.   Cardiovascular: Negative.   Gastrointestinal: Negative.   Genitourinary: Negative.   Musculoskeletal: Negative.   Skin: Negative.   Neurological: Negative.   Psychiatric/Behavioral: Negative for suicidal ideas and hallucinations.    Blood pressure 126/52, pulse 98, temperature 97.7 F (36.5 C), temperature source Oral, resp. rate 19, height  (1.88 m), weight 68.947 kg (152 lb), SpO2 100.00%.Body mass index is 19.51 kg/(m^2).  General Appearance: Casual  Eye Contact::  Fair  Speech:  Normal Rate  Volume:  Normal  Mood:  Euthymic  Affect:  Appropriate  Thought Process:  Goal Directed  Orientation:  Full (Time, Place, and Person)  Thought Content:  WDL  Suicidal Thoughts:  No  Homicidal Thoughts:  No  Memory:  Immediate;   Fair Recent;   Fair Remote;   Fair  Judgement:  Fair  Insight:  Fair  Psychomotor Activity:  Normal  Concentration:  Fair  Recall:  [employer] of Knowledge:Fair  Language: Good  Akathisia:  No    AIMS (if indicated):   0  Assets:  Communication Skills Desire for Improvement Social Support  Sleep:  Number of Hours:  6.75    Musculoskeletal: Strength & Muscle Tone: within normal limits Gait & Station: normal Patient leans: N/A   Mental Status Per Nursing Assessment::   On Admission:  NA  Current Mental Status by Physician: denies paranoia,SI/HI/AH/VH  Loss Factors: Financial problems/change in socioeconomic status  Historical Factors: Impulsivity  Risk Reduction Factors:   Positive social support and Positive therapeutic relationship  Continued Clinical Symptoms:  Bipolar Disorder:   Mixed State  Cognitive Features That Contribute To Risk:  Polarized thinking    Suicide Risk:  Minimal: No identifiable suicidal ideation.  Patients presenting with no risk factors but with morbid ruminations; may be classified as minimal risk based on the severity of the depressive symptoms  Discharge Diagnoses:   Primary psychiatric diagnosis:  Schizoaffective disorder, bipolar type ,multiple episodes ,currently in acute episode   Secondary psychiatric diagnosis:  Cannabis use disorder  Alcohol use disorder   Non -psychiatric diagnosis:  None    Plan Of Care/Follow-up recommendations:  Activity:  No restrictions  Is patient on multiple antipsychotic therapies at discharge:  No   Has Patient had three or more failed trials of antipsychotic monotherapy by history:  No  Recommended Plan for Multiple Antipsychotic Therapies: NA    German Manke 09/06/2014, 10:13 AM

## 2014-09-06 NOTE — Progress Notes (Signed)
Adult Psychoeducational Group Note  Date:  09/06/2014 Time:  4:15 AM  Group Topic/Focus:  Wrap-Up Group:   The focus of this group is to help patients review their daily goal of treatment and discuss progress on daily workbooks.  Participation Level:  Minimal  Participation Quality:  Appropriate  Affect:  Blunted  Cognitive:  Appropriate  Insight: Limited  Engagement in Group:  Limited  Modes of Intervention:  Socialization and Support  Additional Comments:  Patient attended and participated in group tonight. He reports having a good day. He spoke with his Aunt and his Mother today. He went to the gym,went to his group and for his meals. His support system is his family.  Lita Mains [facility] 09/06/2014, 4:15 AM

## 2014-09-06 NOTE — Progress Notes (Signed)
[facility]hme[facility] Adult Case Management Discharge Plan :  Will you be returning to the same living situation after discharge: Yes,  home At discharge, do you have transportation home?:Yes,  family Do you have the ability to pay for your medications:Yes,  mental health  Release of information consent forms completed and in the chart;  Patient's signature needed at discharge.  Patient to Follow up at: Follow-up Information   Follow up with [facility]. (Go to the walk-in clinic M-F between 8 and 9 AM for your hospital follow up appointment)    Contact information:   [address]  [place]  [336] [phone]      Patient denies SI/HI:   Yes,  yes    Safety Planning and Suicide Prevention discussed:  Yes,  yes  Daryel Gerald B 09/06/2014, 10:10 AM

## 2014-09-08 NOTE — Progress Notes (Signed)
Patient Discharge Instructions:  After Visit Summary (AVS):   Faxed to:  09/08/14 Discharge Summary Note:   Faxed to:  09/08/14 Psychiatric Admission Assessment Note:   Faxed to:  09/08/14 Suicide Risk Assessment - Discharge Assessment:   Faxed to:  09/08/14 Faxed/Sent to the Next Level Care provider:  09/08/14 Faxed to [facility] @ [phone]  Jerelene Redden, 09/08/2014, 3:59 PM

## 2015-07-27 ENCOUNTER — Emergency Department (HOSPITAL_COMMUNITY)
Admission: EM | Admit: 2015-07-27 | Discharge: 2015-07-28 | Disposition: A | Discharge status: Other Acute Inpatient Hospital | Payer: Self-pay | Attending: Emergency Medicine | Admitting: Emergency Medicine

## 2015-07-27 ENCOUNTER — Encounter (HOSPITAL_COMMUNITY): Payer: Self-pay | Admitting: *Deleted

## 2015-07-27 DIAGNOSIS — R45851 Suicidal ideations: Secondary | ICD-10-CM

## 2015-07-27 DIAGNOSIS — Z720 Tobacco use: Secondary | ICD-10-CM | POA: Insufficient documentation

## 2015-07-27 DIAGNOSIS — F209 Schizophrenia, unspecified: Secondary | ICD-10-CM

## 2015-07-27 LAB — CBC
HEMATOCRIT: 39.4 % (ref 39.0–52.0)
HEMOGLOBIN: 14 g/dL (ref 13.0–17.0)
MCH: 30.3 pg (ref 26.0–34.0)
MCHC: 35.5 g/dL (ref 30.0–36.0)
MCV: 85.3 fL (ref 78.0–100.0)
Platelets: 284 10*3/uL (ref 150–400)
RBC: 4.62 MIL/uL (ref 4.22–5.81)
RDW: 12.9 % (ref 11.5–15.5)
WBC: 6.1 10*3/uL (ref 4.0–10.5)

## 2015-07-27 MED ORDER — ZOLPIDEM TARTRATE 5 MG PO TABS: 5.0000 mg | ORAL_TABLET | Freq: Every evening | ORAL | Status: DC | PRN

## 2015-07-27 MED ORDER — HALOPERIDOL 5 MG PO TABS
5.0000 mg | ORAL_TABLET | Freq: Two times a day (BID) | ORAL | Status: DC
  Administered 2015-07-28 (×2): 5 mg via ORAL
  Filled 2015-07-27 (×2): qty 1

## 2015-07-27 MED ORDER — NICOTINE 21 MG/24HR TD PT24: 21.0000 mg | MEDICATED_PATCH | Freq: Every day | TRANSDERMAL | Status: DC

## 2015-07-27 NOTE — ED Notes (Signed)
Patient presents withdrawn, stating he "doesn't want to hurt anyone else".  Admits to having a plan but would not rveil to this nurse  States he has not taken meds for months.

## 2015-07-27 NOTE — ED Notes (Signed)
Patient reports he was going to [facility] for psychiatry tx, but stopped going. Cannot remember the  Last time he took his regular home meds, but says "i sometimes take my haldol". Patient reports talking to himself, states sometimes he thinks of hurting himself, but states no plan. Mother, Lizbeth Bark. [phone] is contact home number, job phone number also available [phone], to reach her at work). Oriented patient to room and surroundings, sitter involvement, and plan of care. Room secured. Sitter present at the bedside.

## 2015-07-27 NOTE — ED Notes (Signed)
Patient changed into maroon scrubs and staffing called for sitter.  There will be a sitter available

## 2015-07-27 NOTE — ED Notes (Signed)
Patient not in room yet 

## 2015-07-27 NOTE — ED Provider Notes (Signed)
CSN: [id number]     Arrival date & time 07/27/15  2241 History   First MD Initiated Contact with Patient 07/27/15 2300     Chief Complaint  Patient presents with  . Suicidal     (Consider location/radiation/quality/duration/timing/severity/associated sxs/prior Treatment) HPI Comments: This 34 year old male with a history of bipolar and schizophrenia.  He states he's been off his medication for a while.  He states he knows he should take his medicine, but he stopped it.  He may because he read on line the side effects and was afraid to take it.  He states he thought about suicide but has no specific plan.  He's never tried to hurt himself in the  The history is provided by the patient.    Past Medical History  Diagnosis Date  . Schizophrenia    History reviewed. No pertinent past surgical history. Family History  Problem Relation Age of Onset  . Depression Other    Social History  Substance Use Topics  . Smoking status: Heavy Tobacco Smoker -- 0.50 packs/day    Types: Cigarettes  . Smokeless tobacco: None  . Alcohol Use: 0.6 oz/week    1 Shots of liquor per week    Review of Systems  Constitutional: Negative for fever.  Respiratory: Negative for cough.   Neurological: Negative for dizziness and headaches.  Psychiatric/Behavioral: Positive for suicidal ideas and decreased concentration. Negative for hallucinations, behavioral problems, confusion and agitation. The patient is nervous/anxious.   All other systems reviewed and are negative.     Allergies  Mushroom extract complex; Excedrin back &; Fish allergy; Motrin; and Onion  Home Medications   Prior to Admission medications   Medication Sig Start Date End Date Taking? Authorizing Provider  benztropine (COGENTIN) 0.5 MG tablet Take 1 tablet (0.5 mg total) by mouth 2 (two) times daily. Patient not taking: Reported on 07/27/2015 09/06/14   Thermon Leyland, NP  divalproex (DEPAKOTE ER) 250 MG 24 hr tablet Take 5 tablets  (1,250 mg total) by mouth every evening. For mood control. Patient not taking: Reported on 07/27/2015 09/06/14   Thermon Leyland, NP  haloperidol (HALDOL) 10 MG tablet Take 1 tablet (10 mg total) by mouth 2 (two) times daily. Patient not taking: Reported on 07/27/2015 09/06/14   Thermon Leyland, NP   BP 131/78 mmHg  Pulse 78  Temp(Src) 98.1 F (36.7 C) (Oral)  Resp 17  Ht 6' (1.829 m)  Wt 161 lb (73.029 kg)  BMI 21.83 kg/m2  SpO2 100% Physical Exam  Constitutional: He appears well-developed and well-nourished.  HENT:  Head: Normocephalic.  Eyes: Pupils are equal, round, and reactive to light.  Neck: Normal range of motion.  Pulmonary/Chest: Effort normal.  Musculoskeletal: Normal range of motion.  Neurological: He is alert.  Skin: Skin is warm.  Psychiatric: His mood appears anxious. His speech is delayed. He is slowed. Cognition and memory are impaired. He expresses inappropriate judgment. He expresses suicidal ideation. He expresses no suicidal plans.  Patient makes poor eye contact.  Has repetitive speech pattern  Nursing note and vitals reviewed.   ED Course  Procedures (including critical care time) Labs Review Labs Reviewed  COMPREHENSIVE METABOLIC PANEL - Abnormal; Notable for the following:    Potassium 3.4 (*)    CO2 21 (*)    Glucose, Bld 102 (*)    Creatinine, Ser 1.43 (*)    Total Protein 5.9 (*)    AST 61 (*)    Alkaline Phosphatase 27 (*)  All other components within normal limits  ACETAMINOPHEN LEVEL - Abnormal; Notable for the following:    Acetaminophen (Tylenol), Serum <10 (*)    All other components within normal limits  ETHANOL  SALICYLATE LEVEL  CBC  URINE RAPID DRUG SCREEN, HOSP PERFORMED    Imaging Review No results found. I, Whitaker Holderman K, personally reviewed and evaluated these images and lab results as part of my medical decision-making.   EKG Interpretation None      MDM   Final diagnoses:  Schizophrenia, unspecified type  Suicidal  ideations        Earley Favor, NP 07/28/15 0406  Lyndal Pulley, MD 07/31/15 [phone]

## 2015-07-27 NOTE — ED Notes (Signed)
Gail, NP, at the bedside.  

## 2015-07-27 NOTE — ED Notes (Signed)
Sitter is present at the bedside.

## 2015-07-28 ENCOUNTER — Encounter (HOSPITAL_COMMUNITY): Payer: Self-pay

## 2015-07-28 ENCOUNTER — Inpatient Hospital Stay (HOSPITAL_COMMUNITY)
Admission: AD | Admit: 2015-07-28 | Discharge: 2015-08-04 | DRG: 885 | Disposition: A | Discharge status: Home / Self Care | Payer: Medicaid Other | Source: Intra-hospital | Attending: Psychiatry | Admitting: Psychiatry

## 2015-07-28 DIAGNOSIS — G4700 Insomnia, unspecified: Secondary | ICD-10-CM | POA: Diagnosis present

## 2015-07-28 DIAGNOSIS — F17210 Nicotine dependence, cigarettes, uncomplicated: Secondary | ICD-10-CM | POA: Diagnosis present

## 2015-07-28 DIAGNOSIS — F251 Schizoaffective disorder, depressive type: Principal | ICD-10-CM | POA: Diagnosis present

## 2015-07-28 DIAGNOSIS — Z9114 Patient's other noncompliance with medication regimen: Secondary | ICD-10-CM | POA: Diagnosis present

## 2015-07-28 DIAGNOSIS — Z590 Homelessness: Secondary | ICD-10-CM

## 2015-07-28 DIAGNOSIS — F419 Anxiety disorder, unspecified: Secondary | ICD-10-CM | POA: Diagnosis present

## 2015-07-28 DIAGNOSIS — F29 Unspecified psychosis not due to a substance or known physiological condition: Secondary | ICD-10-CM | POA: Diagnosis present

## 2015-07-28 LAB — RAPID URINE DRUG SCREEN, HOSP PERFORMED
AMPHETAMINES: NOT DETECTED
BENZODIAZEPINES: NOT DETECTED
Barbiturates: NOT DETECTED
Cocaine: NOT DETECTED
OPIATES: NOT DETECTED
TETRAHYDROCANNABINOL: NOT DETECTED

## 2015-07-28 LAB — COMPREHENSIVE METABOLIC PANEL
ALT: 25 U/L (ref 17–63)
ANION GAP: 13 (ref 5–15)
AST: 61 U/L — ABNORMAL HIGH (ref 15–41)
Albumin: 3.9 g/dL (ref 3.5–5.0)
Alkaline Phosphatase: 27 U/L — ABNORMAL LOW (ref 38–126)
BUN: 16 mg/dL (ref 6–20)
CALCIUM: 9.3 mg/dL (ref 8.9–10.3)
CO2: 21 mmol/L — AB (ref 22–32)
CREATININE: 1.43 mg/dL — AB (ref 0.61–1.24)
Chloride: 101 mmol/L (ref 101–111)
GFR calc Af Amer: >60 mL/min (ref >60)
GLUCOSE: 102 mg/dL — AB (ref 65–99)
Potassium: 3.4 mmol/L — ABNORMAL LOW (ref 3.5–5.1)
Sodium: 135 mmol/L (ref 135–145)
Total Bilirubin: 1 mg/dL (ref 0.3–1.2)
Total Protein: 5.9 g/dL — ABNORMAL LOW (ref 6.5–8.1)

## 2015-07-28 LAB — ETHANOL

## 2015-07-28 LAB — SALICYLATE LEVEL

## 2015-07-28 LAB — ACETAMINOPHEN LEVEL

## 2015-07-28 MED ORDER — MAGNESIUM HYDROXIDE 400 MG/5ML PO SUSP: 30.0000 mL | Freq: Every day | ORAL | Status: DC | PRN

## 2015-07-28 MED ORDER — HYDROXYZINE HCL 25 MG PO TABS
25.0000 mg | ORAL_TABLET | ORAL | Status: DC | PRN
  Administered 2015-07-31: 25 mg via ORAL
  Filled 2015-07-28: qty 1

## 2015-07-28 MED ORDER — ALUM & MAG HYDROXIDE-SIMETH 200-200-20 MG/5ML PO SUSP
30.0000 mL | ORAL | Status: DC | PRN
  Administered 2015-07-28: 30 mL via ORAL
  Filled 2015-07-28: qty 30

## 2015-07-28 MED ORDER — TRAZODONE HCL 50 MG PO TABS: 50.0000 mg | ORAL_TABLET | Freq: Every evening | ORAL | Status: DC | PRN

## 2015-07-28 MED ORDER — NICOTINE 21 MG/24HR TD PT24
21.0000 mg | MEDICATED_PATCH | Freq: Every day | TRANSDERMAL | Status: DC
  Administered 2015-07-29 – 2015-07-30 (×2): 21 mg via TRANSDERMAL
  Filled 2015-07-28 (×7): qty 1

## 2015-07-28 NOTE — Tx Team (Signed)
Initial Interdisciplinary Treatment Plan   PATIENT STRESSORS: Financial difficulties Medication change or noncompliance   PATIENT STRENGTHS: Ability for insight Active sense of humor Motivation for treatment/growth   PROBLEM LIST: Problem List/Patient Goals Date to be addressed Date deferred Reason deferred Estimated date of resolution  "Get back on my meds" 07-28-15     "Improve behaviors" 07-28-15     "depressed" 07-28-15                                          DISCHARGE CRITERIA:  Ability to meet basic life and health needs Adequate post-discharge living arrangements Improved stabilization in mood, thinking, and/or behavior Reduction of life-threatening or endangering symptoms to within safe limits  PRELIMINARY DISCHARGE PLAN: Outpatient therapy Return to previous living arrangement  PATIENT/FAMIILY INVOLVEMENT: This treatment plan has been presented to and reviewed with the patient, Austin Gutierrez, and/or family member, .  The patient and family have been given the opportunity to ask questions and make suggestions.  Austin Gutierrez [facility] 07/28/2015, 4:50 PM

## 2015-07-28 NOTE — ED Notes (Signed)
Pt eating breakfast. NAD. Only complaint of small headache. Will not make eye contact with this nurse. Affect is flat. Pt seems paranoid about care. Understands plan of care at this time. Sitter at bedside.
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
## 2015-07-28 NOTE — Progress Notes (Signed)
[facility] Group Notes:  (Nursing/MHT/Case Management/Adjunct)  Date:  07/28/2015  Time:  9:35 PM  Type of Therapy:  Psychoeducational Skills  Participation Level:  None  Participation Quality:  Resistant  Affect:  Flat  Cognitive:  Lacking  Insight:  None  Engagement in Group:  None  Modes of Intervention:  Education  Summary of Progress/Problems: The patient sat in group for a few minutes and then returned to his room since he was not feeling well. Prior to the start of group, the patient had complained of not feeling well.   Hazle Coca S 07/28/2015, 9:35 PM

## 2015-07-28 NOTE — ED Notes (Signed)
Pt attempting to swallow alcohol sanitizer wipe that was hidden under mirror in bedside table. Sitter obtained wipe. Pt in NAD. Pt denies visual/auditory hallucinations; however states "I sure am thinking about hurting myself."

## 2015-07-28 NOTE — BH Assessment (Signed)
Tele Assessment Note   Austin Gutierrez is a 34 y.o. male who voluntarily presents to [facility] with SI/Depression and Aud Halluc.  Pt reports the following: he has been off his medications x3 months--"I thought I could stand on my own without my medications".  Pt says he is hearing his grandmother's voice and sometimes he hears voices with command but is not currently endorsing this issue.  When asked is he is SI, pt responded "a little not much", stating that he has no plan but he has thought getting a knife and cutting his arm.  Pt denies past SI attempts.  During the interview, pt had poor eye contact, looking at the floor.  He was quiet and withdrawn, barely audible and endorsing depressive sxs: anhedonia and poor appetite.  This Clinical research associate discussed disposition with Hulan Fess, NP who recommends inpt admission for stabilization.    Axis I: Schizoaffective Disorder Axis II: Deferred Axis III:  Past Medical History  Diagnosis Date  . Schizophrenia    Axis IV: other psychosocial or environmental problems, problems related to social environment and problems with primary support group Axis V: 31-40 impairment in reality testing  Past Medical History:  Past Medical History  Diagnosis Date  . Schizophrenia     History reviewed. No pertinent past surgical history.  Family History:  Family History  Problem Relation Age of Onset  . Depression Other     Social History:  reports that he has been smoking Cigarettes.  He has been smoking about 0.50 packs per day. He does not have any smokeless tobacco history on file. He reports that he drinks about 0.6 oz of alcohol per week. He reports that he does not use illicit drugs.  Additional Social History:  Alcohol / Drug Use Pain Medications: See MAR  Prescriptions: See MAR  Over the Counter: See mAR  History of alcohol / drug use?: No history of alcohol / drug abuse Longest period of sobriety (when/how long): None   CIWA: CIWA-Ar BP: 142/89  mmHg Pulse Rate: 91 COWS:    PATIENT STRENGTHS: (choose at least two) Motivation for treatment/growth  Allergies:  Allergies  Allergen Reactions  . Mushroom Extract Complex Nausea And Vomiting and Rash  . Excedrin Back & [Acetaminophen-Aspirin Buffered] Nausea And Vomiting  . Fish Allergy     "makes me breakout, sick on stomach, migraines too"  . Motrin [Ibuprofen]   . Onion Nausea And Vomiting and Other (See Comments)    migraines    Home Medications:  (Not in a hospital admission)  OB/GYN Status:  No LMP for male patient.  General Assessment Data Location of Assessment: [facility] ED TTS Assessment: In system Is this a Tele or Face-to-Face Assessment?: Tele Assessment Is this an Initial Assessment or a Re-assessment for this encounter?: Initial Assessment Marital status: Single Maiden name: None  Is patient pregnant?: No Pregnancy Status: No Living Arrangements: Non-relatives/Friends (Lives with a roommate ) Can pt return to current living arrangement?: Yes Admission Status: Voluntary Is patient capable of signing voluntary admission?: Yes Referral Source: MD Insurance type: SP   Medical Screening Exam [facility] Walk-in ONLY) Medical Exam completed: No Reason for MSE not completed: Other: (None )  Crisis Care Plan Living Arrangements: Non-relatives/Friends (Lives with a roommate ) Name of Psychiatrist: [facility]  Name of Therapist: [facility]   Education Status Is patient currently in school?: No Current Grade: None  Highest grade of school patient has completed: None  Name of school: None  Contact person: None  Risk to self with the past 6 months Suicidal Ideation: Yes-Currently Present Has patient been a risk to self within the past 6 months prior to admission? : No Suicidal Intent: No Has patient had any suicidal intent within the past 6 months prior to admission? : No Is patient at risk for suicide?: Yes Suicidal Plan?: No-Not Currently/Within Last 6 Months Has  patient had any suicidal plan within the past 6 months prior to admission? : No Access to Means: Yes Specify Access to Suicidal Means: Sharps What has been your use of drugs/alcohol within the last 12 months?: Pt denies  Previous Attempts/Gestures: No How many times?: 0 Other Self Harm Risks: None  Triggers for Past Attempts: None known Intentional Self Injurious Behavior: None Family Suicide History: No Recent stressful life event(s): Other (Comment) (Off meds x 3 mos ) Persecutory voices/beliefs?: Yes Depression: Yes Depression Symptoms: Loss of interest in usual pleasures, Isolating, Despondent Substance abuse history and/or treatment for substance abuse?: No Suicide prevention information given to non-admitted patients: Not applicable  Risk to Others within the past 6 months Homicidal Ideation: No Does patient have any lifetime risk of violence toward others beyond the six months prior to admission? : No Thoughts of Harm to Others: No Current Homicidal Intent: No Current Homicidal Plan: No Access to Homicidal Means: No Identified Victim: None  History of harm to others?: No Assessment of Violence: None Noted Violent Behavior Description: None  Does patient have access to weapons?: No Criminal Charges Pending?: No Does patient have a court date: No Is patient on probation?: No  Psychosis Hallucinations: Auditory Delusions: None noted  Mental Status Report Appearance/Hygiene: Disheveled, In scrubs Eye Contact: Poor Motor Activity: Unremarkable Speech: Logical/coherent Level of Consciousness: Alert Mood: Depressed, Preoccupied Affect: Depressed, Preoccupied Anxiety Level: None Thought Processes: Coherent, Relevant Judgement: Impaired Orientation: Person, Place, Time, Situation Obsessive Compulsive Thoughts/Behaviors: None  Cognitive Functioning Concentration: Normal Memory: Recent Intact, Remote Intact IQ: Average Insight: Poor Impulse Control: Fair Appetite:  Poor Weight Loss: 20 Weight Gain: 0 Sleep: Decreased Total Hours of Sleep: 5 Vegetative Symptoms: None  ADLScreening [facility] Assessment Services) Patient's cognitive ability adequate to safely complete daily activities?: Yes Patient able to express need for assistance with ADLs?: Yes Independently performs ADLs?: Yes (appropriate for developmental age)  Prior Inpatient Therapy Prior Inpatient Therapy: Yes Prior Therapy Dates: 2015 Prior Therapy Facilty/Provider(s): [facility]  Reason for Treatment: Schizoprenia   Prior Outpatient Therapy Prior Outpatient Therapy: Yes Prior Therapy Dates: Unk  Prior Therapy Facilty/Provider(s): [facility]  Reason for Treatment: Med mgt  Does patient have an ACCT team?: No Does patient have Intensive In-House Services?  : No Does patient have [facility] services? : Yes Does patient have P4CC services?: No  ADL Screening (condition at time of admission) Patient's cognitive ability adequate to safely complete daily activities?: Yes Is the patient deaf or have difficulty hearing?: No Does the patient have difficulty seeing, even when wearing glasses/contacts?: No Does the patient have difficulty concentrating, remembering, or making decisions?: Yes Patient able to express need for assistance with ADLs?: Yes Does the patient have difficulty dressing or bathing?: No Independently performs ADLs?: Yes (appropriate for developmental age) Does the patient have difficulty walking or climbing stairs?: No Weakness of Legs: None Weakness of Arms/Hands: None  Home Assistive Devices/Equipment Home Assistive Devices/Equipment: None  Therapy Consults (therapy consults require a physician order) PT Evaluation Needed: No OT Evalulation Needed: No SLP Evaluation Needed: No Abuse/Neglect Assessment (Assessment to be complete while patient is alone) Physical Abuse:  Denies Verbal Abuse: Denies Sexual Abuse: Denies Exploitation of patient/patient's resources:  Denies Self-Neglect: Denies Values / Beliefs Cultural Requests During Hospitalization: None Spiritual Requests During Hospitalization: None Consults Spiritual Care Consult Needed: No Social Work Consult Needed: No Merchant navy officer (For Healthcare) Does patient have an advance directive?: No Would patient like information on creating an advanced directive?: No - patient declined information Nutrition Screen- [facility] Adult/WL/AP Patient's home diet: Regular Has the patient recently lost weight without trying?: No Has the patient been eating poorly because of a decreased appetite?: No Malnutrition Screening Tool Score: 0  Additional Information 1:1 In Past 12 Months?: No CIRT Risk: No Elopement Risk: No Does patient have medical clearance?: Yes     Disposition:  Disposition Initial Assessment Completed for this Encounter: Yes Disposition of Patient: Referred to (Per Hulan Fess, NP recommends inpt admission ) Patient referred to: Other (Comment) (Per Hulan Fess, NP recommends inpt admission )  Murrell Redden 07/28/2015 12:39 AM

## 2015-07-28 NOTE — ED Notes (Signed)
Patient was given a snack and drink. 

## 2015-07-28 NOTE — ED Notes (Signed)
Verified with security that the patient has already been wanded. 

## 2015-07-28 NOTE — Progress Notes (Signed)
Seeking inpatient psych placement for pt.   Referred to: [facility]- per Christiane Ha, no IPRS beds currently but will review for waitlist [facility]- per Karoline Caldwell, same as above [facility]- refer per Jonny Ruiz [facility]- per Victorino Dike  Left voicemails for several other facilities (including [facility], [facility], [facility], [facility], [facility]) and will refer if appropriate.  Ilean Skill, MSW, LCSW Clinical Social Work, Disposition  07/28/2015 [phone]

## 2015-07-28 NOTE — ED Notes (Signed)
Patient ambulated to new room with sitter with no problems. Suspicious of plan of care. Explained time for checking vital signs and new surroundings.

## 2015-07-28 NOTE — Progress Notes (Signed)
Patient has been accepted to [facility]al [facility]: 502-1 to Dr. Marella Bile Patient will transfer by [facility]row and has signed his admission paperwork. F[facility] to [facility].  Called patient mother per patient request.  Mother aware of patient being moved and in agreement as she reports he needs to stabilize as he has been internally per occupied and talking to himself at home. She reports he has been living with a girlfriend and unclear if he can return to same situation. Patient also reports he is actively working on disability. His only previous follow up has been [place] and mother reports it has been a while since he was last seen a [facility].  Patient has moments of clarity while at ED when discussing DC plan and remembering previous MD as well as structure of BH with rules and setting up pass code. Patient encouraged to call mother once he arrives.    Previous medications:  Per DC summary. Medication List      Indication   benztropine 0.5 MG tablet  Commonly known as: COGENTIN  Take 1 tablet (0.5 mg total) by mouth 2 (two) times daily.   Indication: Extrapyramidal Reaction caused by Medications     divalproex 250 MG 24 hr tablet  Commonly known as: DEPAKOTE ER  Take 5 tablets (1,250 mg total) by mouth every evening. For mood control.   Indication: Mood stabilization     haloperidol 10 MG tablet  Commonly known as: HALDOL  Take 1 tablet (10 mg total) by mouth 2 (two) times daily.   Indication: Psychosis         Deretha Emory, MSW Clinical Social Work: Emergency Room [phone]

## 2015-07-28 NOTE — ED Notes (Signed)
Pt safely departing ER with [facility] driver. NAD. A/O x4. Ambulatory with steady gait. VSS. All of pt belongings were taken home yesterday with pt mother.

## 2015-07-28 NOTE — Progress Notes (Signed)
Nursing Note : received pt on unit, orient to program. Pt very familiar with unit.laughing inappropriately to self although minimizing A/v hall. Pt reports going homes may be a problem, asked if he would need assistance with housing pt just smiled saying" you would never know " then started to laugh at self.

## 2015-07-29 ENCOUNTER — Encounter (HOSPITAL_COMMUNITY): Payer: Self-pay | Admitting: Psychiatry

## 2015-07-29 DIAGNOSIS — F251 Schizoaffective disorder, depressive type: Principal | ICD-10-CM

## 2015-07-29 MED ORDER — DIVALPROEX SODIUM ER 500 MG PO TB24
500.0000 mg | ORAL_TABLET | Freq: Every day | ORAL | Status: DC
  Administered 2015-07-29 – 2015-08-01 (×4): 500 mg via ORAL
  Filled 2015-07-29 (×6): qty 1

## 2015-07-29 MED ORDER — HALOPERIDOL 5 MG PO TABS
5.0000 mg | ORAL_TABLET | Freq: Every day | ORAL | Status: DC
  Administered 2015-07-29 – 2015-08-04 (×7): 5 mg via ORAL
  Filled 2015-07-29 (×7): qty 1
  Filled 2015-07-29: qty 14
  Filled 2015-07-29 (×2): qty 1

## 2015-07-29 MED ORDER — MIRTAZAPINE 15 MG PO TABS
15.0000 mg | ORAL_TABLET | Freq: Every day | ORAL | Status: DC
  Administered 2015-07-29 – 2015-08-03 (×6): 15 mg via ORAL
  Filled 2015-07-29 (×3): qty 1
  Filled 2015-07-29: qty 14
  Filled 2015-07-29 (×6): qty 1

## 2015-07-29 NOTE — Progress Notes (Signed)
Patient ID: Austin Gutierrez, male   DOB: Aug 08, 1981, 34 y.o.   MRN: [id number] In hall outside his door tearful. Offered to talk, but he refused. Had said earlier he was feeling very depressed. Offered him several distractions along with processing but he refused them all.

## 2015-07-29 NOTE — Progress Notes (Signed)
Patient ID: Austin Gutierrez, male   DOB: 11/03/1981, 34 y.o.   MRN: [id number] Numerous times today looked worried and would ask frequently if everything was OK. Appears worried and tense.

## 2015-07-29 NOTE — Progress Notes (Signed)
D   Pt appears preoccupied with his thoughts   He isolates and appears scared   He complained of chest discomfort from indigestion and heartburn  A   Verbal support given  Medications administered and effectiveness  monitored    Q 15 min checks   Administered maalox with relief stated R   Pt is safe at present

## 2015-07-29 NOTE — Progress Notes (Signed)
Patient ID: Austin Gutierrez, male   DOB: 09/01/1981, 34 y.o.   MRN: [id number] EKG completed as ordered.
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
## 2015-07-29 NOTE — Progress Notes (Signed)
Patient ID: Austin Gutierrez, male   DOB: Dec 11, 1981, 34 y.o.   MRN: [id number] D-In room quiet when writer approached to talk and to check on him. He had been animated, pleasant and out on the unit prior to now. States he needs his nicotine patch now. Had offered int earlier and he declined it though he is a smoker, ready now. He states he had spoken with his mom earlier today on the phone and she told him she was proud of him which made him feel better. His chief complaint is sx of depression with some anxiety. He denies any voices visions or suspiciousness, and is able to contract for safety. A-Support offered monitored for safety medications as ordered. R-Wanting to speak with the dr about his medications re his depression.
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
## 2015-07-29 NOTE — Progress Notes (Signed)
[facility] 1000  Patient was able to discuss healthy coping skills and their application in daily life.  The focus of this group is to educate the patient on the purpose and policies of crisis stabilization and provide a format to answer questions about their admission.  The group details unit policies and expectations of patients while admitted. 
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
## 2015-07-29 NOTE — [facility] Suicide Risk Assessment (Signed)
[facility] Admission Suicide Risk Assessment   Nursing information obtained from:  Patient, Review of record Demographic factors:  Male, Low socioeconomic status, Unemployed Current Mental Status:  Suicidal ideation indicated by patient, Self-harm thoughts Loss Factors:  Financial problems / change in socioeconomic status Historical Factors:  Family history of mental illness or substance abuse Risk Reduction Factors:  Sense of responsibility to family, Living with another person, especially a relative, Positive social support Total Time spent with patient: 45 minutes Principal Problem: Schizoaffective Disorder  Diagnosis:   Patient Active Problem List   Diagnosis Date Noted  . Psychosis [F29] 09/01/2014  . Insomnia [G47.00] 09/01/2014     Continued Clinical Symptoms:  Alcohol Use Disorder Identification Test Final Score (AUDIT): 0 The "Alcohol Use Disorders Identification Test", Guidelines for Use in Primary Care, Second Edition.  World Science writer [facility]). Score between 0-7:  no or low risk or alcohol related problems. Score between 8-15:  moderate risk of alcohol related problems. Score between 16-19:  high risk of alcohol related problems. Score 20 or above:  warrants further diagnostic evaluation for alcohol dependence and treatment.   CLINICAL FACTORS:  34 year old male, who has history of mental illness. Reports history of depression and psychosis and prior admission ( 9/15).  At that time he was stabilized with Depakote and Haldol. States he was doing better and had stopped his medications several months ago. He does not remember having had side effects. He also has been facing significant stressors, such as being unemployed and not having source of income at this time He reports worsening depression, and describes neuro-vegetative symptoms of depression such as low energy, anhedonia. Appetite fair , and has lost unspecified weight. He presents severely depressed, with some  degree of psychomotor retardation and poor , soft speech.  At this time denies hallucinations but  Upon admission to ED reported hearing voice of grandmother. At this time denies any suicidal ideations. He does not endorse alcohol or drug abuse . Assessment- Schizoaffective Disorder, currently depressed  Plan - admit to inpatient psychiatric unit. Restart Depakote ER 500 mgrs QHS, Haldol 5 mgrs QHS, and at this time warrants an antidepressant. Start Remeron 15 mgrs QHS   Musculoskeletal: Strength & Muscle Tone: within normal limits Gait & Station: normal Patient leans: N/A  Psychiatric Specialty Exam: Physical Exam  ROS- denies shortness of breath, denies chest pain, no headache   Blood pressure 126/72, pulse 71, temperature 97.9 F (36.6 C), temperature source Oral, resp. rate 20, height 6' (1.829 m), weight 157 lb (71.215 kg).Body mass index is 21.29 kg/(m^2).  General Appearance: Fairly Groomed  Patent attorney::  Poor  Speech:  Slow  Volume:  Decreased  Mood:  Depressed  Affect:  Constricted  Thought Process:  Linear  Orientation:  Other:  fully alert and attentive   Thought Content:  denies hallucinations at this time but endorsed auditory hallucinations upon admission to ED . No delusions expressed at this time  Suicidal Thoughts:  No- at this time denies plan or intention of hurting self or of SI  Homicidal Thoughts:  No  Memory:  recent and remote grossly intact   Judgement:  Fair  Insight:  Fair  Psychomotor Activity:  Decreased  Concentration:  Fair  Recall:  Good  Fund of Knowledge:Good  Language: Fair  Akathisia:  Negative  Handed:  Right  AIMS (if indicated):     Assets:  Desire for Improvement Resilience  Sleep:     Cognition: WNL  ADL's:  Impaired     COGNITIVE FEATURES THAT CONTRIBUTE TO RISK:  Closed-mindedness and Loss of executive function    SUICIDE RISK:   Moderate:  Frequent suicidal ideation with limited intensity, and duration, some specificity  in terms of plans, no associated intent, good self-control, limited dysphoria/symptomatology, some risk factors present, and identifiable protective factors, including available and accessible social support.  PLAN OF CARE: Patient will be admitted to inpatient psychiatric unit for stabilization and safety. Will provide and encourage milieu participation. Provide medication management and maked adjustments as needed.  Will follow daily.    Medical Decision Making:  Review of Psycho-Social Stressors (1), Review or order clinical lab tests (1), Established Problem, Worsening (2), Review of Medication Regimen & Side Effects (2) and Review of New Medication or Change in Dosage (2)  I certify that inpatient services furnished can reasonably be expected to improve the patient's condition.   Nakima Fluegge, Madaline Guthrie 07/29/2015, 3:42 PM

## 2015-07-29 NOTE — Progress Notes (Signed)
D   Pt appears preoccupied with his thoughts   He isolates and appears scared   He talked a little more with staff this evening    Pt was very forthcoming when talking about his mother  He smiles often but does not make eye contact A   Verbal support given  Medications administered and effectiveness  monitored    Q 15 min checks   R   Pt is safe at present

## 2015-07-29 NOTE — [facility] Group Notes (Signed)
[facility] Group Notes:  (Clinical Social Work)  07/29/2015  11:15-12:00PM  Summary of Progress/Problems:   The main focus of today's process group was to discuss patients' feelings related to being hospitalized, as well as the difference between "being" and "having" a mental health diagnosis.  It was agreed in general by the group that it would be preferable to avoid future hospitalizations, and we discussed means of doing that.  As a follow-up, problems with adhering to medication recommendations were discussed.  The patient expressed his primary feeling about being hospitalized is depression because of all that he does not know, for instance where he will go at discharge.  He likes that he is meeting new people and feeling more "normal" here.  He said he spoke with his ex-girlfriend this morning and she told him, "you really should so something crazy and get sent to [facility]."  He said he has no intention of doing that, wants to feel better and go home.  Type of Therapy:  Group Therapy - Process  Participation Level:  Active  Participation Quality:  Appropriate, Attentive, Sharing and Supportive  Affect:  Appropriate  Cognitive:  Alert, Appropriate and Oriented  Insight:  Engaged  Engagement in Therapy:  Engaged  Modes of Intervention:  Exploration, Discussion  Ambrose Mantle, LCSW 07/29/2015, 12:00 PM

## 2015-07-30 LAB — LIPID PANEL
CHOLESTEROL: 137 mg/dL (ref 0–200)
HDL: 51 mg/dL (ref >40)
LDL CALC: 75 mg/dL (ref 0–99)
Total CHOL/HDL Ratio: 2.7 RATIO
Triglycerides: 54 mg/dL (ref <150)
VLDL: 11 mg/dL (ref 0–40)

## 2015-07-30 LAB — TSH: TSH: 1.664 u[IU]/mL (ref 0.350–4.500)

## 2015-07-30 NOTE — Progress Notes (Signed)
[facility] MD Progress Note  07/30/2015 3:19 PM Austin Gutierrez  MRN:  [id number] Subjective:   Patient states "I'm not as depressed. The medications are just making me sleepy. I'm trying to have more positive thoughts. The medications may be helping me with that. I do worry about being homeless but I'm focused on my mind right now. I need Disability because of my mental illness. It's hard for me to hold a job. I'm not hearing voices today."   Objective:  Austin Gutierrez is a 34 y.o. male who voluntarily presented to [facility] with SI/Depression and auditory hallucinations. He reported having been off his psychiatric medications for the last three months. Patient has been hearing his grandmother's voices with command to cut self. Patient denies hearing any voices currently at present. Is reporting a slight improvement in depressive symptoms since being restarted on medications. He appears motivated for treatment. Patient is somewhat sedated during assessment from medications being initiated yesterday as he was off them for some time. He does not bring up possible delusions as he did yesterday. Patient continues to appear depressed but has become more active on the unit. He is attending scheduled groups on the unit. Nursing staff report that the patient tends to isolate at times and makes poor eye contact. Pt reported that his mother is his major support.   Principal Problem: Schizoaffective disorder, depressed type  Diagnosis:   Patient Active Problem List   Diagnosis Date Noted  . Schizoaffective disorder [F25.9] 07/29/2015  . Psychosis [F29] 09/01/2014  . Insomnia [G47.00] 09/01/2014   Total Time spent with patient: 20 minutes  Past Medical History:  Past Medical History  Diagnosis Date  . Schizophrenia    History reviewed. No pertinent past surgical history. Family History:  Family History  Problem Relation Age of Onset  . Depression Other    Social History:  History  Alcohol Use No   Comment: denies regular alcohol use     History  Drug Use No    Social History   Social History  . Marital Status: Single    Spouse Name: N/A  . Number of Children: N/A  . Years of Education: N/A   Social History Main Topics  . Smoking status: Heavy Tobacco Smoker -- 0.50 packs/day    Types: Cigarettes  . Smokeless tobacco: None  . Alcohol Use: No     Comment: denies regular alcohol use  . Drug Use: No  . Sexual Activity: Yes   Other Topics Concern  . None   Social History Narrative   Additional History:    Sleep: Fair  Appetite:  Fair  Musculoskeletal: Strength & Muscle Tone: within normal limits Gait & Station: normal Patient leans: N/A  Psychiatric Specialty Exam: Physical Exam  Psychiatric: His speech is normal. He is slowed. Cognition and memory are normal. He exhibits a depressed mood.    Review of Systems  Constitutional: Negative.   HENT: Negative.   Eyes: Negative.   Respiratory: Negative.   Cardiovascular: Negative.   Gastrointestinal: Negative.   Genitourinary: Negative.   Musculoskeletal: Negative.   Skin: Negative.   Neurological: Negative.   Endo/Heme/Allergies: Negative.   Psychiatric/Behavioral: Positive for depression. The patient has insomnia.     Blood pressure 132/78, pulse 60, temperature 97.8 F (36.6 C), temperature source Oral, resp. rate 16, height 6' (1.829 m), weight 71.215 kg (157 lb).Body mass index is 21.29 kg/(m^2).  General Appearance: Casual  Eye Contact::  Fair  Speech:  Slow  Volume:  Decreased  Mood:  Depressed  Affect:  Congruent  Thought Process:  Goal Directed  Orientation:  Full (Time, Place, and Person)  Thought Content:  Symptoms, Worries, Concerns  Suicidal Thoughts:  No  Homicidal Thoughts:  No  Memory:  Immediate;   Good Recent;   Fair Remote;   Fair  Judgement:  Fair  Insight:  Present  Psychomotor Activity:  Decreased  Concentration:  Fair  Recall:  [employer] of Knowledge:Good  Language: Good   Akathisia:  No  Handed:  Right  AIMS (if indicated):     Assets:  Communication Skills Desire for Improvement Leisure Time Physical Health Resilience Social Support  ADL's:  Intact  Cognition: WNL  Sleep:  Number of Hours: 6.25     Current Medications: Current Facility-Administered Medications  Medication Dose Route Frequency Provider Last Rate Last Dose  . alum & mag hydroxide-simeth (MAALOX/MYLANTA) 200-200-20 MG/5ML suspension 30 mL  30 mL Oral Q4H PRN Sanjuana Kava, NP   30 mL at 07/28/15 2010  . divalproex (DEPAKOTE ER) 24 hr tablet 500 mg  500 mg Oral QHS Rockey Situ Cobos, MD   500 mg at 07/29/15 2102  . haloperidol (HALDOL) tablet 5 mg  5 mg Oral Daily Craige Cotta, MD   5 mg at 07/30/15 8119  . hydrOXYzine (ATARAX/VISTARIL) tablet 25 mg  25 mg Oral Q4H PRN Sanjuana Kava, NP      . magnesium hydroxide (MILK OF MAGNESIA) suspension 30 mL  30 mL Oral Daily PRN Sanjuana Kava, NP      . mirtazapine (REMERON) tablet 15 mg  15 mg Oral QHS Craige Cotta, MD   15 mg at 07/29/15 2102  . nicotine (NICODERM CQ - dosed in mg/24 hours) patch 21 mg  21 mg Transdermal Q0600 Sanjuana Kava, NP   21 mg at 07/30/15 0820  . traZODone (DESYREL) tablet 50 mg  50 mg Oral QHS PRN Sanjuana Kava, NP        Lab Results:  Results for orders placed or performed during the hospital encounter of 07/28/15 (from the past 48 hour(s))  Lipid panel     Status: None   Collection Time: 07/30/15  6:04 AM  Result Value Ref Range   Cholesterol 137 0 - 200 mg/dL   Triglycerides 54 <147 mg/dL   HDL 51 >82 mg/dL   Total CHOL/HDL Ratio 2.7 RATIO   VLDL 11 0 - 40 mg/dL   LDL Cholesterol 75 0 - 99 mg/dL    Comment:        Total Cholesterol/HDL:CHD Risk Coronary Heart Disease Risk Table                     Men   Women  1/2 Average Risk   3.4   3.3  Average Risk       5.0   4.4  2 X Average Risk   9.6   7.1  3 X Average Risk  23.4   11.0        Use the calculated Patient Ratio above and the CHD Risk  Table to determine the patient's CHD Risk.        ATP III CLASSIFICATION (LDL):  <100     mg/dL   Optimal  956-213  mg/dL   Near or Above                    Optimal  130-159  mg/dL   Borderline  160-189  mg/dL   High  >161     mg/dL   Very High Performed at [facility]   TSH     Status: None   Collection Time: 07/30/15  6:04 AM  Result Value Ref Range   TSH 1.664 0.350 - 4.500 uIU/mL    Comment: Performed at [facility]    Physical Findings: AIMS: Facial and Oral Movements Muscles of Facial Expression: None, normal Lips and Perioral Area: None, normal Jaw: None, normal Tongue: None, normal,Extremity Movements Upper (arms, wrists, hands, fingers): None, normal Lower (legs, knees, ankles, toes): None, normal, Trunk Movements Neck, shoulders, hips: None, normal, Overall Severity Severity of abnormal movements (highest score from questions above): None, normal Incapacitation due to abnormal movements: None, normal Patient's awareness of abnormal movements (rate only patient's report): No Awareness, Dental Status Current problems with teeth and/or dentures?: No Does patient usually wear dentures?: No  CIWA:    COWS:     Treatment Plan Summary: Daily contact with patient to assess and evaluate symptoms and progress in treatment and Medication management  Continue crisis management and stabilization.  Medication management:   -Continue Haldol 5 mg daily for psychotic symptoms -Continue Depakote ER 500 mg at bedtime for mood lability -Continue Remeron 15 mg hs for insomnia/depressive symptoms Encouraged patient to attend groups and participate in group counseling sessions and activities.  Discharge plan in progress.  Continue current treatment plan.  Address health issues: Vitals reviewed and stable.   Medical Decision Making:  Established Problem, Stable/Improving (1), Review of Psycho-Social Stressors (1), Review or order clinical lab tests (1),  Review of Medication Regimen & Side Effects (2) and Review of New Medication or Change in Dosage (2)  DAVIS, LAURA, NP-C 07/30/2015, 3:19 PM Agree with NP progress note as above  Nehemiah Massed, MD

## 2015-07-30 NOTE — [facility] Group Notes (Signed)
[facility] Group Notes: (Clinical Social Work)  07/30/2015 11:00AM-12:00PM  Summary of Progress/Problems: The main focus of today's process group was to listen to a variety of genres of music and to identify that different types of music provoke different responses. The patient then was able to identify personally what was soothing or energizing for them, as well as how to use this knowledge in sleep habits, with depression, for voices, and with other symptoms. The patient expressed understanding of concepts, as well as knowledge of how each type of music affected him/her and how this can be used at home as a wellness/recovery tool.  He clearly enjoyed most of the group, was smiling a great deal and interacting with 2 other male patients.  Type of Therapy: Music Therapy   Participation Level: Active  Participation Quality: Attentive and Sharing  Affect: Excited  Cognitive: Oriented  Insight: Engaged  Engagement in Therapy: Engaged  Modes of Intervention: Activity, Exploration  Ambrose Mantle, LCSW 07/30/2015  12:31 PM

## 2015-07-30 NOTE — Progress Notes (Signed)
D- Patient is quiet and guarded, but did participate verbally in group.Marland Kitchen He is compliant with scheduled medications and denies physical problems.  Reports "good" sleep and appetite.  A. Support and positive feedback given. Medication education done.  15' checks cont for safety.  R- Safety maintained.

## 2015-07-30 NOTE — [facility] Group Notes (Addendum)
[facility] Group Notes:  Healthy support systems  Date:  07/30/2015  Time:  11:21 AM  Type of Therapy:  Group Therapy  Participation Level: Did attend  Participation Quality:attentive  Affect:  Flat  Cognitive:  Lacking  Insight:  None  Engagement in Group:minimal  Modes of Intervention:  Education  Summary of Progress/Problems:  Austin Gutierrez 07/30/2015, 11:21 AM

## 2015-07-30 NOTE — Progress Notes (Signed)
Did not attend group 

## 2015-07-31 LAB — HEMOGLOBIN A1C
HEMOGLOBIN A1C: 5.7 % — AB (ref 4.8–5.6)
Mean Plasma Glucose: 117 mg/dL

## 2015-07-31 MED ORDER — NICOTINE POLACRILEX 2 MG MT GUM
2.0000 mg | CHEWING_GUM | OROMUCOSAL | Status: DC | PRN
  Administered 2015-07-31: 2 mg via ORAL

## 2015-07-31 NOTE — Plan of Care (Signed)
Problem: Alteration in thought process Goal: STG-Patient does not respond to command hallucinations Outcome: Progressing Pt denies AVH.     

## 2015-07-31 NOTE — Progress Notes (Signed)
Patient ID: Austin Gutierrez, male   DOB: January 12, 1981, 34 y.o.   MRN: [id number] D: Patient in room sleeping on approach. Although pt was apathetic, he did respond to writer's question and complaint with medication. Pt denies SI/HI/AVH and pain. No acute distressed noted at this time.   A: Met with pt 1:1. Medications administered as prescribed. Support and encouragement provided to attend groups and engage in milieu. Pt encouraged to discuss feelings and come to staff with any question or concerns.   R: Patient remains safe and complaint with medications.

## 2015-07-31 NOTE — Plan of Care (Signed)
Problem: Consults Goal: Depression Patient Education See Patient Education Module for education specifics.  Outcome: Progressing Nurse discussed depression/coping skills with patient.        

## 2015-07-31 NOTE — Progress Notes (Signed)
[facility] MD Progress Note  07/31/2015 2:11 PM Austin Gutierrez  MRN:  [id number] Subjective:   Patient states "I stopped taking my meds."    Objective:  Austin Gutierrez is a 34 y.o. AA male who voluntarily presented to [facility] with SI/Depression and auditory hallucinations.  Patient seen and chart reviewed.Discussed patient with treatment team.  Pt is a limited historian . Reports he wants to stay on his medications as it is for now. Pt denies any new concerns , however appears paranoid. Pt denies SI/HI. Per nursing pt after today AM 's evaluation was seen as depressed, wanting to see writer a second time . Hence pt seen again - Pt discussed having a lot of psychosocial stressor like not having a place to stay as well as relational struggles with his girlfriend that makes him depressed. Pt currently wants to stay on his current medications and work his way through this.    Principal Problem: Schizoaffective disorder, depressed type  Diagnosis:   Patient Active Problem List   Diagnosis Date Noted  . Schizoaffective disorder [F25.9] 07/29/2015  . Psychosis [F29] 09/01/2014  . Insomnia [G47.00] 09/01/2014   Total Time spent with patient: 30 minutes  Past Medical History:  Past Medical History  Diagnosis Date  . Schizophrenia    History reviewed. No pertinent past surgical history. Family History:  Family History  Problem Relation Age of Onset  . Depression Other    Social History:  History  Alcohol Use No    Comment: denies regular alcohol use     History  Drug Use No    Social History   Social History  . Marital Status: Single    Spouse Name: N/A  . Number of Children: N/A  . Years of Education: N/A   Social History Main Topics  . Smoking status: Heavy Tobacco Smoker -- 0.50 packs/day    Types: Cigarettes  . Smokeless tobacco: None  . Alcohol Use: No     Comment: denies regular alcohol use  . Drug Use: No  . Sexual Activity: Yes   Other Topics Concern  . None    Social History Narrative   Additional History:    Sleep: Fair  Appetite:  Fair  Musculoskeletal: Strength & Muscle Tone: within normal limits Gait & Station: normal Patient leans: N/A  Psychiatric Specialty Exam: Physical Exam  Psychiatric: His speech is normal. He is slowed. Cognition and memory are normal. He exhibits a depressed mood.    Review of Systems  Constitutional: Negative.   HENT: Negative.   Eyes: Negative.   Respiratory: Negative.   Cardiovascular: Negative.   Gastrointestinal: Negative.   Genitourinary: Negative.   Musculoskeletal: Negative.   Skin: Negative.   Neurological: Negative.   Endo/Heme/Allergies: Negative.   Psychiatric/Behavioral: Positive for depression. The patient has insomnia.     Blood pressure 132/78, pulse 60, temperature 98.4 F (36.9 C), temperature source Oral, resp. rate 16, height 6' (1.829 m), weight 71.215 kg (157 lb).Body mass index is 21.29 kg/(m^2).  General Appearance: Casual  Eye Contact::  Fair  Speech:  Slow  Volume:  Decreased  Mood:  Depressed  Affect:  Congruent  Thought Process:  Goal Directed  Orientation:  Full (Time, Place, and Person)  Thought Content:  Paranoid Ideation  Suicidal Thoughts:  No  Homicidal Thoughts:  No  Memory:  Immediate;   Good Recent;   Fair Remote;   Fair  Judgement:  Fair  Insight:  Lacking  Psychomotor Activity:  Decreased  Concentration:  Fair  Recall:  [employer]enn[employer] of Knowledge:Good  Language: Good  Akathisia:  No  Handed:  Right  AIMS (if indicated):     Assets:  Communication Skills Desire for Improvement Leisure Time Physical Health Resilience Social Support  ADL's:  Intact  Cognition: WNL  Sleep:  Number of Hours: 6.25     Current Medications: Current Facility-Administered Medications  Medication Dose Route Frequency Provider Last Rate Last Dose  . alum & mag hydroxide-simeth (MAALOX/MYLANTA) 200-200-20 MG/5ML suspension 30 mL  30 mL Oral Q4H PRN Sanjuana Kava, NP   30 mL at 07/28/15 2010  . divalproex (DEPAKOTE ER) 24 hr tablet 500 mg  500 mg Oral QHS Rockey Situ Cobos, MD   500 mg at 07/30/15 2143  . haloperidol (HALDOL) tablet 5 mg  5 mg Oral Daily Craige Cotta, MD   5 mg at 07/31/15 0803  . hydrOXYzine (ATARAX/VISTARIL) tablet 25 mg  25 mg Oral Q4H PRN Sanjuana Kava, NP   25 mg at 07/31/15 1152  . magnesium hydroxide (MILK OF MAGNESIA) suspension 30 mL  30 mL Oral Daily PRN Sanjuana Kava, NP      . mirtazapine (REMERON) tablet 15 mg  15 mg Oral QHS Craige Cotta, MD   15 mg at 07/30/15 2143  . nicotine (NICODERM CQ - dosed in mg/24 hours) patch 21 mg  21 mg Transdermal Q0600 Sanjuana Kava, NP   21 mg at 07/30/15 0820  . traZODone (DESYREL) tablet 50 mg  50 mg Oral QHS PRN Sanjuana Kava, NP        Lab Results:  Results for orders placed or performed during the hospital encounter of 07/28/15 (from the past 48 hour(s))  Lipid panel     Status: None   Collection Time: 07/30/15  6:04 AM  Result Value Ref Range   Cholesterol 137 0 - 200 mg/dL   Triglycerides 54 <409 mg/dL   HDL 51 >81 mg/dL   Total CHOL/HDL Ratio 2.7 RATIO   VLDL 11 0 - 40 mg/dL   LDL Cholesterol 75 0 - 99 mg/dL    Comment:        Total Cholesterol/HDL:CHD Risk Coronary Heart Disease Risk Table                     Men   Women  1/2 Average Risk   3.4   3.3  Average Risk       5.0   4.4  2 X Average Risk   9.6   7.1  3 X Average Risk  23.4   11.0        Use the calculated Patient Ratio above and the CHD Risk Table to determine the patient's CHD Risk.        ATP III CLASSIFICATION (LDL):  <100     mg/dL   Optimal  191-478  mg/dL   Near or Above                    Optimal  130-159  mg/dL   Borderline  295-621  mg/dL   High  >308     mg/dL   Very High Performed at [facility]   Hemoglobin A1c     Status: Abnormal   Collection Time: 07/30/15  6:04 AM  Result Value Ref Range   Hgb A1c MFr Bld 5.7 (H) 4.8 - 5.6 %    Comment: (NOTE)  Pre-diabetes: 5.7 - 6.4         Diabetes: >6.4         Glycemic control for adults with diabetes: <7.0    Mean Plasma Glucose 117 mg/dL    Comment: (NOTE) Performed At: [facility] [address] [place], [place] [id number] Mila Homer MD WU:[id number] Performed at [facility]   TSH     Status: None   Collection Time: 07/30/15  6:04 AM  Result Value Ref Range   TSH 1.664 0.350 - 4.500 uIU/mL    Comment: Performed at [facility]    Physical Findings: AIMS: Facial and Oral Movements Muscles of Facial Expression: None, normal Lips and Perioral Area: None, normal Jaw: None, normal Tongue: None, normal,Extremity Movements Upper (arms, wrists, hands, fingers): None, normal Lower (legs, knees, ankles, toes): None, normal, Trunk Movements Neck, shoulders, hips: None, normal, Overall Severity Severity of abnormal movements (highest score from questions above): None, normal Incapacitation due to abnormal movements: None, normal Patient's awareness of abnormal movements (rate only patient's report): No Awareness, Dental Status Current problems with teeth and/or dentures?: No Does patient usually wear dentures?: No  CIWA:  CIWA-Ar Total: 1 COWS:  COWS Total Score: 1  Assessment: Pt is a 51 y old AAM with hx of psychosis , presented after he was decompensation after being noncompliant on his medications. Will continue medications as well as treatment.    Treatment Plan Summary: Daily contact with patient to assess and evaluate symptoms and progress in treatment and Medication management  Continue crisis management and stabilization.  Medication management:   -Continue Haldol 5 mg daily for psychotic symptoms -Continue Depakote ER 500 mg at bedtime for mood lability -Continue Remeron 15 mg hs for insomnia/depressive symptoms Encouraged patient to attend groups and participate in group counseling sessions and activities.  Discharge  plan in progress.  Continue current treatment plan.  Address health issues: Vitals reviewed and stable.   Medical Decision Making:  Established Problem, Stable/Improving (1), Review of Psycho-Social Stressors (1), Review or order clinical lab tests (1), Review of Medication Regimen & Side Effects (2) and Review of New Medication or Change in Dosage (2)  Cassidey Barrales, MD 07/31/2015, 2:11 PM

## 2015-07-31 NOTE — [facility] Group Notes (Signed)
[facility] LCSW Group Therapy  07/31/2015 5:33 PM   Type of Therapy:  Group Therapy  Participation Level:  Active  Participation Quality:  Attentive  Affect:  Appropriate  Cognitive:  Appropriate  Insight:  Improving  Engagement in Therapy:  Engaged  Modes of Intervention:  Clarification, Education, Exploration and Socialization  Summary of Progress/Problems: Today's group focused on relapse prevention.  We defined the term, and then brainstormed on ways to prevent relapse.  Stayed the entire time and was attentive throughout.  However, minimal participation.  Stated he did not think recovery and relapse apply to his situation, even when others were talking about it in terms of mental health issues and symptoms.  However, appeared to be enjoying himself as he smiling at humorous statements by others.  Austin Gutierrez 07/31/2015 , 5:33 PM

## 2015-07-31 NOTE — Progress Notes (Signed)
The focus of this group is to help patients review their daily goal of treatment and discuss progress on daily workbooks. Pt attended the evening group session and responded to all discussion prompts from the Writer. Pt shared that today was a good day on the unit, the highlights of which were the meals. "I probably ate too much, but it was good." Pt's only request from Nursing Staff this evening was for new linens, which were given to him following group. Pt shared that his goal for the week included going home with medications that would help him focus and be successful. Pt's affect was appropriate.

## 2015-07-31 NOTE — [facility] Group Notes (Signed)
[facility] LCSW Aftercare Discharge Planning Group Note   07/31/2015 5:31 PM  Participation Quality:  Minimal  Mood/Affect:  Depressed  Depression Rating:  7  Anxiety Rating:  6  Thoughts of Suicide:  No Will you contract for safety?   NA  Current AVH:  Yes  Plan for Discharge/Comments:  "I need to get back on my meds.  I was staying with a friend, but I don't think I can go back there.  I guess you could say I am homeless.  Can you help me with Disability?  I have been denied and I used to have an attorney.  Soft spoken new admission confirms he used to go to [place] and will return there.  Grim.  Unsmiling.  Transportation Means:   Supports:  Daryel Gerald B

## 2015-07-31 NOTE — Progress Notes (Signed)
D:  Patient's self inventory sheet, patient has fair sleep, no sleep medication given.  Fair appetite, normal energy level.  Rated depression 2, denied hopeless and anxiety.  Denied withdrawals.  Has experienced chilling in past 24 hours.  Denied SI.  Denied physical pain.  No pain medication given.  Goal is to stop being depressed.  Plans to stop thinking about bad things.  No discharge plans. A:  Medications administered per MD orders.  Emotional support and encouragement given patient. R:  Denied SI and HI, contracts for safety.  Denied A/V hallucinations.  Safety maintained with 15 minute checks.

## 2015-08-01 LAB — BASIC METABOLIC PANEL
Anion gap: 4 — ABNORMAL LOW (ref 5–15)
BUN: 16 mg/dL (ref 6–20)
CALCIUM: 8.6 mg/dL — AB (ref 8.9–10.3)
CO2: 32 mmol/L (ref 22–32)
CREATININE: 1.28 mg/dL — AB (ref 0.61–1.24)
Chloride: 104 mmol/L (ref 101–111)
GFR calc Af Amer: >60 mL/min (ref >60)
GFR calc non Af Amer: >60 mL/min (ref >60)
GLUCOSE: 99 mg/dL (ref 65–99)
Potassium: 3.9 mmol/L (ref 3.5–5.1)
Sodium: 140 mmol/L (ref 135–145)

## 2015-08-01 NOTE — Plan of Care (Signed)
Problem: Alteration in thought process Goal: STG-Patient does not respond to command hallucinations Outcome: Progressing Pt denies AVH.     

## 2015-08-01 NOTE — Progress Notes (Signed)
[facility] MD Progress Note  08/01/2015 9:52 AM Austin Gutierrez  MRN:  [id number] Subjective:   Patient states "I am fine.'    Objective:  Austin Gutierrez is a 34 y.o. AA male who voluntarily presented to [facility] with SI/Depression and auditory hallucinations.  Patient seen and chart reviewed.Discussed patient with treatment team.  Pt today seen as calm, but depressed. Pt reports that his AH is improved . Pt has been tolerating all medications well. Pt with several psychosocial stressors like relational struggles with girlfriend as well as homelessness. Pt encouraged to attend groups and stay on medications. Per staff - pt appears depressed, withdrawn , limited historian , no disruptive issues noted on the unit.     Principal Problem: Schizoaffective disorder, depressed type  Diagnosis:   Patient Active Problem List   Diagnosis Date Noted  . Schizoaffective disorder [F25.9] 07/29/2015  . Psychosis [F29] 09/01/2014  . Insomnia [G47.00] 09/01/2014   Total Time spent with patient: 25 minutes  Past Medical History:  Past Medical History  Diagnosis Date  . Schizophrenia    History reviewed. No pertinent past surgical history. Family History:  Family History  Problem Relation Age of Onset  . Depression Other    Social History:  History  Alcohol Use No    Comment: denies regular alcohol use     History  Drug Use No    Social History   Social History  . Marital Status: Single    Spouse Name: N/A  . Number of Children: N/A  . Years of Education: N/A   Social History Main Topics  . Smoking status: Heavy Tobacco Smoker -- 0.50 packs/day    Types: Cigarettes  . Smokeless tobacco: None  . Alcohol Use: No     Comment: denies regular alcohol use  . Drug Use: No  . Sexual Activity: Yes   Other Topics Concern  . None   Social History Narrative   Additional History:    Sleep: Fair  Appetite:  Fair  Musculoskeletal: Strength & Muscle Tone: within normal limits Gait &  Station: normal Patient leans: N/A  Psychiatric Specialty Exam: Physical Exam  Psychiatric: His speech is normal. He is slowed. Cognition and memory are normal. He exhibits a depressed mood.    Review of Systems  Constitutional: Negative.   HENT: Negative.   Eyes: Negative.   Respiratory: Negative.   Cardiovascular: Negative.   Gastrointestinal: Negative.   Genitourinary: Negative.   Musculoskeletal: Negative.   Skin: Negative.   Neurological: Negative.   Endo/Heme/Allergies: Negative.   Psychiatric/Behavioral: Positive for depression. The patient does not have insomnia.     Blood pressure 128/71, pulse 68, temperature 97.9 F (36.6 C), temperature source Oral, resp. rate 20, height 6' (1.829 m), weight 71.215 kg (157 lb).Body mass index is 21.29 kg/(m^2).  General Appearance: Casual  Eye Contact::  Fair  Speech:  Slow  Volume:  Decreased  Mood:  Depressed  Affect:  Congruent  Thought Process:  Goal Directed  Orientation:  Full (Time, Place, and Person)  Thought Content:  Paranoid Ideation  Suicidal Thoughts:  No  Homicidal Thoughts:  No  Memory:  Immediate;   Good Recent;   Fair Remote;   Fair  Judgement:  Fair  Insight:  Lacking  Psychomotor Activity:  Decreased  Concentration:  Fair  Recall:  [employer] of Knowledge:Good  Language: Good  Akathisia:  No  Handed:  Right  AIMS (if indicated):     Assets:  Communication Skills Desire for Improvement  Leisure Time Physical Health Resilience Social Support  ADL's:  Intact  Cognition: WNL  Sleep:  Number of Hours: 6     Current Medications: Current Facility-Administered Medications  Medication Dose Route Frequency Provider Last Rate Last Dose  . alum & mag hydroxide-simeth (MAALOX/MYLANTA) 200-200-20 MG/5ML suspension 30 mL  30 mL Oral Q4H PRN Encarnacion Slates, NP   30 mL at 07/28/15 2010  . divalproex (DEPAKOTE ER) 24 hr tablet 500 mg  500 mg Oral QHS Myer Peer Cobos, MD   500 mg at 07/31/15 2101  .  haloperidol (HALDOL) tablet 5 mg  5 mg Oral Daily Jenne Campus, MD   5 mg at 08/01/15 0801  . hydrOXYzine (ATARAX/VISTARIL) tablet 25 mg  25 mg Oral Q4H PRN Encarnacion Slates, NP   25 mg at 07/31/15 1152  . magnesium hydroxide (MILK OF MAGNESIA) suspension 30 mL  30 mL Oral Daily PRN Encarnacion Slates, NP      . mirtazapine (REMERON) tablet 15 mg  15 mg Oral QHS Jenne Campus, MD   15 mg at 07/31/15 2101  . nicotine polacrilex (NICORETTE) gum 2 mg  2 mg Oral PRN Ursula Alert, MD   2 mg at 07/31/15 1817  . traZODone (DESYREL) tablet 50 mg  50 mg Oral QHS PRN Encarnacion Slates, NP        Lab Results:  Results for orders placed or performed during the hospital encounter of 07/28/15 (from the past 48 hour(s))  Basic metabolic panel     Status: Abnormal   Collection Time: 08/01/15  6:34 AM  Result Value Ref Range   Sodium 140 135 - 145 mmol/L   Potassium 3.9 3.5 - 5.1 mmol/L   Chloride 104 101 - 111 mmol/L   CO2 32 22 - 32 mmol/L   Glucose, Bld 99 65 - 99 mg/dL   BUN 16 6 - 20 mg/dL   Creatinine, Ser 1.28 (H) 0.61 - 1.24 mg/dL   Calcium 8.6 (L) 8.9 - 10.3 mg/dL   GFR calc non Af Amer >60 >60 mL/min   GFR calc Af Amer >60 >60 mL/min    Comment: (NOTE) The eGFR has been calculated using the CKD EPI equation. This calculation has not been validated in all clinical situations. eGFR's persistently <60 mL/min signify possible Chronic Kidney Disease.    Anion gap 4 (L) 5 - 15    Comment: Performed at [facility]    Physical Findings: AIMS: Facial and Oral Movements Muscles of Facial Expression: None, normal Lips and Perioral Area: None, normal Jaw: None, normal Tongue: None, normal,Extremity Movements Upper (arms, wrists, hands, fingers): None, normal Lower (legs, knees, ankles, toes): None, normal, Trunk Movements Neck, shoulders, hips: None, normal, Overall Severity Severity of abnormal movements (highest score from questions above): None, normal Incapacitation due to  abnormal movements: None, normal Patient's awareness of abnormal movements (rate only patient's report): No Awareness, Dental Status Current problems with teeth and/or dentures?: No Does patient usually wear dentures?: No  CIWA:  CIWA-Ar Total: 1 COWS:  COWS Total Score: 1  Assessment: Pt is a 39 y old AAM with hx of psychosis , presented after he was decompensation after being noncompliant on his medications. Will continue medications as well as treatment.    Treatment Plan Summary: Daily contact with patient to assess and evaluate symptoms and progress in treatment and Medication management  Continue crisis management and stabilization.  Medication management:   -Continue Haldol 5 mg daily  for psychotic symptoms -Continue Depakote ER 500 mg at bedtime for mood lability. Depakote level tomorrow AM. -Continue Remeron 15 mg hs for insomnia/depressive symptoms -Reviewed BMP - Creatinine is down trending , K+ is corrected. Encouraged patient to attend groups and participate in group counseling sessions and activities.  Discharge plan in progress.  Continue current treatment plan.  Address health issues: Vitals reviewed and stable.   Medical Decision Making:  Established Problem, Stable/Improving (1), Review of Psycho-Social Stressors (1), Review or order clinical lab tests (1), Review of Medication Regimen & Side Effects (2) and Review of New Medication or Change in Dosage (2)  Kirin Pastorino, MD 08/01/2015, 9:52 AM

## 2015-08-01 NOTE — [facility] Group Notes (Signed)
[facility] Group Notes:  (Nursing/MHT/Case Management/Adjunct)  Date:  08/01/2015  Time:  1:59 PM  Type of Therapy:  Group Therapy  Participation Level:  Minimal  Participation Quality:  Appropriate  Affect:  Flat  Cognitive:  Alert and Appropriate  Insight:  Limited  Engagement in Group:  Lacking and Limited  Modes of Intervention:  Discussion and Education  Summary of Progress/Problems:  Purpose of the group was recovery and goals.  Discussed the importance of setting appropriate and measurable goals.  Reviewed sleep hygiene.  Islam attended group and was attentive but quiet.  Minimal interaction.    Norm Parcel Avnoor Koury 08/01/2015, 1:59 PM

## 2015-08-01 NOTE — Progress Notes (Signed)
DAR Note: Austin Gutierrez has been seen walking the hall.  He has attended groups and was able to answer some questions appropriately during group.  He remains very quiet but pleasant.  He denies any SI/HI or A/V hallucinations at this time.  Talked with him about his medications and he voiced no questions or issues.  Reminded him of the importance in taking his medications on a regular basis.  He reports on his self inventory that his depression, anxiety and hopelessness are all 0/10.  His goal for the day is to take his medications on a regular basis.  He denies any pain or physical complaints.  Q 15 minute safety maintained.

## 2015-08-01 NOTE — [facility] Group Notes (Signed)
[facility] LCSW Group Therapy  08/01/2015 , 1:08 PM   Type of Therapy:  Group Therapy  Participation Level:  Active  Participation Quality:  Attentive  Affect:  Appropriate  Cognitive:  Alert  Insight:  Improving  Engagement in Therapy:  Engaged  Modes of Intervention:  Discussion, Exploration and Socialization  Summary of Progress/Problems: Today's group focused on the term Diagnosis.  Participants were asked to define the term, and then pronounce whether it is a negative, positive or neutral term. Stayed the entire time, and was engaged throughout, but gave very limited input, even when invited to do so. His contribution to the diagnosis discussion was to say that diagnosis is going to the Dr to find out what is wrong with you.  At one point, when another patient had hijacked the group and was sharing, Tobin whispered to me "He really needs some support now, doesn't he?"    Daryel Gerald B 08/01/2015 , 1:08 PM

## 2015-08-01 NOTE — Progress Notes (Signed)
The focus of this group is to help patients review their daily goal of treatment and discuss progress on daily workbooks. Pt did not attend the evening group. 

## 2015-08-01 NOTE — Plan of Care (Signed)
Problem: Ineffective individual coping Goal: STG: Patient will remain free from self harm Outcome: Progressing No episodes of self harm.  He denies any suicidal ideation

## 2015-08-01 NOTE — Progress Notes (Signed)
Patient ID: Austin Gutierrez, male   DOB: November 29, 1981, 34 y.o.   MRN: [id number] D: Patient reports he has been eating a little too much. Pt stated he looking forward to getting used to his medication and getting well again. Pt denies SI/HI/AVH and pain. No acute distressed noted at this time.  A: Met with pt 1:1. Medications administered as prescribed. Support and encouragement provided to attend groups and engage in milieu. Pt encouraged to discuss feelings and come to staff with any question or concerns.  R: Patient remains safe and complaint with medications.

## 2015-08-02 LAB — PROLACTIN: Prolactin: 24.5 ng/mL — ABNORMAL HIGH (ref 4.0–15.2)

## 2015-08-02 LAB — VALPROIC ACID LEVEL: Valproic Acid Lvl: 46 ug/mL — ABNORMAL LOW (ref 50.0–100.0)

## 2015-08-02 MED ORDER — DIVALPROEX SODIUM ER 500 MG PO TB24
750.0000 mg | ORAL_TABLET | Freq: Every day | ORAL | Status: DC
  Administered 2015-08-02 – 2015-08-03 (×2): 750 mg via ORAL
  Filled 2015-08-02 (×8): qty 1

## 2015-08-02 NOTE — Progress Notes (Signed)
DAR Note: Austin Gutierrez has been visible on the unit.  He states that he is having problems with feeling sleeping during the day because of the medications.  His goal for the day per his self inventory was "not to sleep too much."  He reports his anxiety, depression and hopelessness 0/10.  He takes his medication without difficulty.  He has attended groups.  He denies any physical complaints.   Q 15 minute checks maintained for safety.

## 2015-08-02 NOTE — Plan of Care (Signed)
Problem: Alteration in mood Goal: LTG-Patient reports reduction in suicidal thoughts (Patient reports reduction in suicidal thoughts and is able to verbalize a safety plan for whenever patient is feeling suicidal)  Outcome: Progressing Austin Gutierrez voiced no suicidal thoughts today and reports that he is feeling better

## 2015-08-02 NOTE — Plan of Care (Signed)
Problem: Food- and Nutrition-Related Knowledge Deficit (NB-1.1) Goal: Nutrition education Formal process to instruct or train a patient/client in a skill or to impart knowledge to help patients/clients voluntarily manage or modify food choices and eating behavior to maintain or improve health. Outcome: Completed/Met Date Met:  08/02/15  RD consulted for nutrition education regarding pre-diabetes and elevated HgbA1c.    Lab Results  Component Value Date    HGBA1C 5.7* 07/30/2015    RD provided "MyPlate" handout. Discussed different food groups and their effects on blood sugar, emphasizing carbohydrate-containing foods. Provided list of carbohydrates and recommended serving sizes of common foods.  Discussed importance of controlled and consistent carbohydrate intake throughout the day. Discouraged intake of sugar-sweetened beverages.Provided examples of ways to balance meals/snacks and encouraged intake of high-fiber, whole grain complex carbohydrates. Teach back method used.  Expect good compliance.  Body mass index is 21.29 kg/(m^2). Pt meets criteria for normal range based on current BMI.  Diet Order: Diet heart healthy/carb modified Room service appropriate?: Yes; Fluid consistency:: Thin Pt is also offered choice of unit snacks mid-morning and mid-afternoon.  Pt is eating as desired.   Labs and medications reviewed. No further nutrition interventions warranted at this time. If additional nutrition issues arise, please re-consult RD.  Clayton Bibles, MS, RD, LDN Pager: [phone] After Hours Pager: [phone]

## 2015-08-02 NOTE — Progress Notes (Signed)
D   Pt is cooperative and pleasant   Pt was concerned about why the doctor increased his depakote because he said it was already making him too sleepy  But he did take the medication and said he would talk with the doctor about it tomorrow A   Verbal support given   Medications administered and effectiveness monitored   Q 15 min checks R   Pt safe at present

## 2015-08-02 NOTE — Tx Team (Signed)
Interdisciplinary Treatment Plan Update (Adult)  Date:  08/02/2015   Time Reviewed:  2:00 PM   Progress in Treatment: Attending groups: Yes. Participating in groups:  Yes. Taking medication as prescribed:  Yes. Tolerating medication:  Yes. Family/Significant othe contact made:  No Patient understands diagnosis:  Yes  As evidenced by seeking help with depressin Discussing patient identified problems/goals with staff:  Yes, see initial care plan. Medical problems stabilized or resolved:  Yes. Denies suicidal/homicidal ideation: Yes. Issues/concerns per patient self-inventory:  No. Other:  New problem(s) identified:  Discharge Plan or Barriers: return home, follow up outpt  Reason for Continuation of Hospitalization: Depression Medication stabilization  Comments:  "I am a bit depressed, trying to figure out what is going on.' Austin Gutierrez is a 34 y.o. AA male who voluntarily presented to [facility] with SI/Depression and auditory hallucinations.  Patient seen and chart reviewed.Discussed patient with treatment team.  Pt today continues to feel depressed, more so about his situation.Pt with several psychosocial stressors like relational struggles with girlfriend as well as homelessness. Pt reports that his AH is improved . Pt has been tolerating all medications well.Depakote level reviewed. Discussed increasing the dose. Pt encouraged to attend groups and stay on medications. Per staff - pt appears depressed, withdrawn , limited historian , no disruptive issues noted on the unit.  Depakote, Haldol, Remeron trial  Estimated length of stay: 2-3 days  New goal(s):  Review of initial/current patient goals per problem list:   Review of initial/current patient goals per problem list:  1. Goal(s): Patient will participate in aftercare plan   Met: Yes   Target date: 3-5 days post admission date   As evidenced by: Patient will participate within aftercare plan AEB aftercare  provider and housing plan at discharge being identified.  08/02/2015: Pt plans to return home, follow up outpt.     2. Goal (s): Patient will exhibit decreased depressive symptoms and suicidal ideations.   Met: No   Target date: 3-5 days post admission date   As evidenced by: Patient will utilize self rating of depression at 3 or below and demonstrate decreased signs of depression or be deemed stable for discharge by MD. 08/02/2015 Pt rates his depression at a 5 today     5. Goal(s): Patient will demonstrate decreased signs of psychosis  * Met:   * Target date: 3-5 days post admission date  * As evidenced by: Patient will demonstrate decreased frequency of AVH or return to baseline function  08/02/2015 C/O AH prior to admission.  Not taking any meds.      Attendees: Patient:  08/02/2015 2:00 PM   Family:   08/02/2015 2:00 PM   Physician:  Ursula Alert, MD 08/02/2015 2:00 PM   Nursing:   Gaylan Gerold, RN 08/02/2015 2:00 PM   CSW:    Roque Lias, LCSW   08/02/2015 2:00 PM   Other:  08/02/2015 2:00 PM   Other:   08/02/2015 2:00 PM   Other:  Lars Pinks, Nurse CM 08/02/2015 2:00 PM   Other:  Lucinda Dell, [facility] 08/02/2015 2:00 PM   Other:  Norberto Sorenson, O[facility]  08/02/2015 2:00 PM   Other:  08/02/2015 2:00 PM   Other:  08/02/2015 2:00 PM   Other:  08/02/2015 2:00 PM   Other:  08/02/2015 2:00 PM   Other:  08/02/2015 2:00 PM   Other:   08/02/2015 2:00 PM    Scribe for Treatment Team:   Roque Lias B, 08/02/2015 2:00 PM

## 2015-08-02 NOTE — Plan of Care (Signed)
Problem: Diagnosis: Increased Risk For Suicide Attempt Goal: STG-Patient Will Comply With Medication Regime Outcome: Progressing Pt compliant with medication. Pt denies any adverse reaction

## 2015-08-02 NOTE — Progress Notes (Signed)
[facility] MD Progress Note  08/02/2015 1:44 PM Austin Gutierrez  MRN:  [id number] Subjective:   Patient states "I am a bit depressed, trying to figure out what is going on.'    Objective:  Austin Gutierrez is a 34 y.o. AA male who voluntarily presented to [facility] with SI/Depression and auditory hallucinations.  Patient seen and chart reviewed.Discussed patient with treatment team.  Pt today continues to feel depressed, more so about his situation.Pt with several psychosocial stressors like relational struggles with girlfriend as well as homelessness. Pt reports that his AH is improved . Pt has been tolerating all medications well.Depakote level reviewed. Discussed increasing the dose. Pt encouraged to attend groups and stay on medications. Per staff - pt appears depressed, withdrawn , limited historian , no disruptive issues noted on the unit.     Principal Problem: Schizoaffective disorder, depressed type  Diagnosis:   Patient Active Problem List   Diagnosis Date Noted  . Schizoaffective disorder [F25.9] 07/29/2015  . Psychosis [F29] 09/01/2014  . Insomnia [G47.00] 09/01/2014   Total Time spent with patient: 25 minutes  Past Medical History:  Past Medical History  Diagnosis Date  . Schizophrenia    History reviewed. No pertinent past surgical history. Family History:  Family History  Problem Relation Age of Onset  . Depression Other    Social History:  History  Alcohol Use No    Comment: denies regular alcohol use     History  Drug Use No    Social History   Social History  . Marital Status: Single    Spouse Name: N/A  . Number of Children: N/A  . Years of Education: N/A   Social History Main Topics  . Smoking status: Heavy Tobacco Smoker -- 0.50 packs/day    Types: Cigarettes  . Smokeless tobacco: None  . Alcohol Use: No     Comment: denies regular alcohol use  . Drug Use: No  . Sexual Activity: Yes   Other Topics Concern  . None   Social History Narrative    Additional History:    Sleep: Fair  Appetite:  Fair  Musculoskeletal: Strength & Muscle Tone: within normal limits Gait & Station: normal Patient leans: N/A  Psychiatric Specialty Exam: Physical Exam  Psychiatric: His speech is normal. He is slowed. Cognition and memory are normal. He exhibits a depressed mood.    Review of Systems  Constitutional: Negative.   HENT: Negative.   Eyes: Negative.   Respiratory: Negative.   Cardiovascular: Negative.   Gastrointestinal: Negative.   Genitourinary: Negative.   Musculoskeletal: Negative.   Skin: Negative.   Neurological: Negative.   Endo/Heme/Allergies: Negative.   Psychiatric/Behavioral: Positive for depression. The patient does not have insomnia.     Blood pressure 136/71, pulse 78, temperature 98.3 F (36.8 C), temperature source Oral, resp. rate 20, height 6' (1.829 m), weight 71.215 kg (157 lb).Body mass index is 21.29 kg/(m^2).  General Appearance: Casual  Eye Contact::  Fair  Speech:  Slow  Volume:  Decreased  Mood:  Depressed  Affect:  Congruent  Thought Process:  Goal Directed  Orientation:  Full (Time, Place, and Person)  Thought Content:  Paranoid Ideation improving  Suicidal Thoughts:  No  Homicidal Thoughts:  No  Memory:  Immediate;   Good Recent;   Fair Remote;   Fair  Judgement:  Fair  Insight:  Lacking  Psychomotor Activity:  Decreased  Concentration:  Fair  Recall:  [employer] of Knowledge:Good  Language: Good  Akathisia:  No  Handed:  Right  AIMS (if indicated):     Assets:  Communication Skills Desire for Improvement Leisure Time Physical Health Resilience Social Support  ADL's:  Intact  Cognition: WNL  Sleep:  Number of Hours: 6.75     Current Medications: Current Facility-Administered Medications  Medication Dose Route Frequency Provider Last Rate Last Dose  . alum & mag hydroxide-simeth (MAALOX/MYLANTA) 200-200-20 MG/5ML suspension 30 mL  30 mL Oral Q4H PRN Encarnacion Slates, NP   30  mL at 07/28/15 2010  . divalproex (DEPAKOTE ER) 24 hr tablet 750 mg  750 mg Oral QHS Azariah Latendresse, MD      . haloperidol (HALDOL) tablet 5 mg  5 mg Oral Daily Myer Peer Cobos, MD   5 mg at 08/02/15 0806  . hydrOXYzine (ATARAX/VISTARIL) tablet 25 mg  25 mg Oral Q4H PRN Encarnacion Slates, NP   25 mg at 07/31/15 1152  . magnesium hydroxide (MILK OF MAGNESIA) suspension 30 mL  30 mL Oral Daily PRN Encarnacion Slates, NP      . mirtazapine (REMERON) tablet 15 mg  15 mg Oral QHS Jenne Campus, MD   15 mg at 08/01/15 2119  . nicotine polacrilex (NICORETTE) gum 2 mg  2 mg Oral PRN Ursula Alert, MD   2 mg at 07/31/15 1817  . traZODone (DESYREL) tablet 50 mg  50 mg Oral QHS PRN Encarnacion Slates, NP        Lab Results:  Results for orders placed or performed during the hospital encounter of 07/28/15 (from the past 48 hour(s))  Basic metabolic panel     Status: Abnormal   Collection Time: 08/01/15  6:34 AM  Result Value Ref Range   Sodium 140 135 - 145 mmol/L   Potassium 3.9 3.5 - 5.1 mmol/L   Chloride 104 101 - 111 mmol/L   CO2 32 22 - 32 mmol/L   Glucose, Bld 99 65 - 99 mg/dL   BUN 16 6 - 20 mg/dL   Creatinine, Ser 1.28 (H) 0.61 - 1.24 mg/dL   Calcium 8.6 (L) 8.9 - 10.3 mg/dL   GFR calc non Af Amer >60 >60 mL/min   GFR calc Af Amer >60 >60 mL/min    Comment: (NOTE) The eGFR has been calculated using the CKD EPI equation. This calculation has not been validated in all clinical situations. eGFR's persistently <60 mL/min signify possible Chronic Kidney Disease.    Anion gap 4 (L) 5 - 15    Comment: Performed at [facility]  Prolactin     Status: Abnormal   Collection Time: 08/01/15  6:34 AM  Result Value Ref Range   Prolactin 24.5 (H) 4.0 - 15.2 ng/mL    Comment: (NOTE) Performed At: [facility] [place], [place] [id number] Lindon Romp MD IO:[id number] Performed at [facility]   Valproic acid level     Status: Abnormal    Collection Time: 08/02/15  6:55 AM  Result Value Ref Range   Valproic Acid Lvl 46 (L) 50.0 - 100.0 ug/mL    Comment: Performed at [facility]    Physical Findings: AIMS: Facial and Oral Movements Muscles of Facial Expression: None, normal Lips and Perioral Area: None, normal Jaw: None, normal Tongue: None, normal,Extremity Movements Upper (arms, wrists, hands, fingers): None, normal Lower (legs, knees, ankles, toes): None, normal, Trunk Movements Neck, shoulders, hips: None, normal, Overall Severity Severity of abnormal movements (highest score from questions  above): None, normal Incapacitation due to abnormal movements: None, normal Patient's awareness of abnormal movements (rate only patient's report): No Awareness, Dental Status Current problems with teeth and/or dentures?: No Does patient usually wear dentures?: No  CIWA:  CIWA-Ar Total: 1 COWS:  COWS Total Score: 1  Assessment: Pt is a 52 y old AAM with hx of psychosis , presented after he was decompensation after being noncompliant on his medications. Will continue medications as well as treatment.    Treatment Plan Summary: Daily contact with patient to assess and evaluate symptoms and progress in treatment and Medication management  Continue crisis management and stabilization.  Medication management:   -Continue Haldol 5 mg daily for psychotic symptoms -Increase Depakote ER to 750 mg po at bedtime for mood lability. Depakote level - 46 ug/ml ( 08/02/15). Will get another level in 5 days. -Continue Remeron 15 mg hs for insomnia/depressive symptoms Encouraged patient to attend groups and participate in group counseling sessions and activities.  Discharge plan in progress.  Continue current treatment plan.  Address health issues: Vitals reviewed and stable.   Medical Decision Making:  Established Problem, Stable/Improving (1), Review of Psycho-Social Stressors (1), Review or order clinical lab tests  (1), Review of Medication Regimen & Side Effects (2) and Review of New Medication or Change in Dosage (2)  Dicy Smigel, MD 08/02/2015, 1:44 PM

## 2015-08-02 NOTE — Progress Notes (Signed)
Patient ID: Austin Gutierrez, male   DOB: [birth year], 34 y.o.   MRN: [id number] D: Patient in bed sleeping on approach. Pt stated he looking forward to getting used to his medication and getting well again. Pt denies SI/HI/AVH and pain. No acute distressed noted at this time.  A: Met with pt 1:1. Medications administered as prescribed. Support and encouragement provided to attend groups and engage in milieu. Pt encouraged to discuss feelings and come to staff with any question or concerns.  R: Patient remains safe and complaint with medications.

## 2015-08-02 NOTE — Progress Notes (Signed)
Adult Psychoeducational Group Note  Date:  08/02/2015 Time:  9:41 PM  Group Topic/Focus:  Wrap-Up Group:   The focus of this group is to help patients review their daily goal of treatment and discuss progress on daily workbooks.  Participation Level:  Active  Participation Quality:  Appropriate  Affect:  Appropriate  Cognitive:  Appropriate  Insight: Appropriate  Engagement in Group:  Engaged  Modes of Intervention:  Discussion  Additional Comments:  The patient expressed that he attended group.  [name] 08/02/2015, 9:41 PM

## 2015-08-02 NOTE — [facility] Group Notes (Signed)
[facility] [employer] Group Therapy  08/02/2015 , 2:10 PM    Type of Therapy:  [employer] Presentation  Participation Level:  Active  Participation Quality:  Attentive  Affect:  Blunted  Cognitive:  Oriented  Insight:  Limited  Engagement in Therapy:  Engaged  Modes of Intervention:  Discussion, Education and Socialization  Summary of Progress/Problems:  Onalee Hua from [employer] came to present his recovery story and play the guitar.  Stayed the entire time. Attentive throughout.  Daryel Gerald B 08/02/2015 , 2:10 PM

## 2015-08-03 MED ORDER — NICOTINE 14 MG/24HR TD PT24
14.0000 mg | MEDICATED_PATCH | Freq: Every day | TRANSDERMAL | Status: DC
  Administered 2015-08-03 – 2015-08-04 (×2): 14 mg via TRANSDERMAL
  Filled 2015-08-03 (×5): qty 1

## 2015-08-03 NOTE — Progress Notes (Signed)
Adult Psychoeducational Group Note  Date:  08/03/2015 Time: 09:15am  Group Topic/Focus:  Goals Group:   The focus of this group is to help patients establish daily goals to achieve during treatment and discuss how the patient can incorporate goal setting into their daily lives to aide in recovery.  Participation Level:  Minimal  Participation Quality:  Appropriate and Attentive  Affect:  Flat  Cognitive:  Alert and Oriented  Insight: Improving  Engagement in Group:  Engaged  Modes of Intervention:  Activity, Education, Orientation and Support  Additional Comments:  Pt able to identify one daily goal to accomplish today.   Aurora Mask 08/03/2015, 12:50 PM

## 2015-08-03 NOTE — [facility] Group Notes (Signed)
[facility] Group Notes:  (Counselor/Nursing/MHT/Case Management/Adjunct)  08/03/2015 1:15PM  Type of Therapy:  Group Therapy  Participation Level:  Active  Participation Quality:  Appropriate  Affect:  Flat  Cognitive:  Oriented  Insight:  Improving  Engagement in Group:  Limited  Engagement in Therapy:  Limited  Modes of Intervention:  Discussion, Exploration and Socialization  Summary of Progress/Problems: The topic for group was balance in life.  Pt participated in the discussion about when their life was in balance and out of balance and how this feels.  Pt discussed ways to get back in balance and short term goals they can work on to get where they want to be. Austin Gutierrez was quiet as usual.  Stated he is balanced "becuase I am not unhappy like I was when I first came into the hospital."  Later, was challenged to find places to meet people outside of here as he admitted he does not have many supports, and does not make friends easily.   [name] 08/03/2015 3:31 PM

## 2015-08-03 NOTE — Progress Notes (Addendum)
Patient ID: Austin Gutierrez, male   DOB: [birth year], 35 y.o.   MRN: [id number]   Pt currently presents with a flat affect and cooperative behavior. Per self inventory, pt rates depression, hopelessness and anxiety at a 0. Pt's daily goal is "discussing what my discharge plans are when I leave here" and they intend to do so by "I guess discuss it with the social worker." Pt reports fair sleep, a fair appetite and normal energy.   Pt provided with medications per providers orders. Pt's labs and vitals were monitored throughout the day. Pt supported emotionally and encouraged to express concerns and questions. Pt educated on medications.  Pt's safety ensured with 15 minute and environmental checks. Pt currently denies SI/HI and A/V hallucinations. Pt verbally agrees to seek staff if SI/HI or A/VH occurs and to consult with staff before acting on these thoughts. Pt only complaint is increased sedation caused by "the haldol and my medications." Pt worried that talking to the doctor about sedation will delay his discharge date. Pt encouraged to personally tell MD. MD notified by Clinical research associate. Will continue POC.

## 2015-08-03 NOTE — Plan of Care (Signed)
Problem: Alteration in thought process Goal: STG-Patient is able to discuss thoughts with staff Outcome: Progressing Pt spoke with writer about his ideas on discharge plans and his feelings about his roommate and his diagnosis

## 2015-08-03 NOTE — Progress Notes (Signed)
D   Pt is cooperative and pleasant   Pt was concerned about why the doctor increased his depakote because he said it was already making him too sleepy  But he did take the medication and said he would talk with the doctor about it tomorrow   Pt is smiling and pleasant he said he forgot to talk to the doctor about his medications but he is feeling much better A   Verbal support given   Medications administered and effectiveness monitored   Q 15 min checks R   Pt safe at present

## 2015-08-03 NOTE — Progress Notes (Signed)
  [facility] Adult Case Management Discharge Plan :  Will you be returning to the same living situation after discharge:  Yes,  home At discharge, do you have transportation home?: Yes,  friend or bus pass Do you have the ability to pay for your medications: Yes,  mental health  Release of information consent forms completed and in the chart;  Patient's signature needed at discharge.  Patient to Follow up at: Follow-up Information    Follow up with [facility]. Go in 2 days.   Why:  Go to the [facility] Mon-Fri 8am-3pm for follow-up.  The wait can be lengthy, so plan ahead by taking food, a drink, something to do with you.   Contact information:   [facility] [address] [place]  [address] Tel:  [phone]      Patient denies SI/HI: Yes,  yes    Safety Planning and Suicide Prevention discussed: Yes,  yes  Have you used any form of tobacco in the last 30 days? (Cigarettes, Smokeless Tobacco, Cigars, and/or Pipes): Yes  Has patient been referred to the Quitline?: Yes, faxed on 08/04/15  [name] 08/03/2015, 8:45 AM

## 2015-08-03 NOTE — Progress Notes (Signed)
[facility] MD Progress Note  08/03/2015 2:39 PM Son Barkan  MRN:  [id number] Subjective:   Patient states "I am better .'    Objective:  Birch Farino is a 34 y.o. AA male who voluntarily presented to [facility] with SI/Depression and auditory hallucinations.  Patient seen and chart reviewed.Discussed patient with treatment team.  Pt today continues to feel depressed but is seen more on the unit , reports mood as better than yesterday . Pt with several psychosocial stressors like relational struggles with girlfriend as well as homelessness. Pt's AH continues to improve.  Pt has been tolerating all medications well.Depakote level was increased yesterday - denies any side effects. Pt encouraged to attend groups and stay on medications. Per staff -  no disruptive issues noted on the unit., pt attending groups , is compliant on meds.     Principal Problem: Schizoaffective disorder, depressed type  Diagnosis:   Patient Active Problem List   Diagnosis Date Noted  . Schizoaffective disorder [F25.9] 07/29/2015  . Psychosis [F29] 09/01/2014  . Insomnia [G47.00] 09/01/2014   Total Time spent with patient: 25 minutes  Past Medical History:  Past Medical History  Diagnosis Date  . Schizophrenia    History reviewed. No pertinent past surgical history. Family History:  Family History  Problem Relation Age of Onset  . Depression Other    Social History:  History  Alcohol Use No    Comment: denies regular alcohol use     History  Drug Use No    Social History   Social History  . Marital Status: Single    Spouse Name: N/A  . Number of Children: N/A  . Years of Education: N/A   Social History Main Topics  . Smoking status: Heavy Tobacco Smoker -- 0.50 packs/day    Types: Cigarettes  . Smokeless tobacco: None  . Alcohol Use: No     Comment: denies regular alcohol use  . Drug Use: No  . Sexual Activity: Yes   Other Topics Concern  . None   Social History Narrative    Additional History:    Sleep: Fair  Appetite:  Fair  Musculoskeletal: Strength & Muscle Tone: within normal limits Gait & Station: normal Patient leans: N/A  Psychiatric Specialty Exam: Physical Exam  Psychiatric: His speech is normal. He is slowed. Cognition and memory are normal. He exhibits a depressed mood.    Review of Systems  Constitutional: Negative.   HENT: Negative.   Eyes: Negative.   Respiratory: Negative.   Cardiovascular: Negative.   Gastrointestinal: Negative.   Genitourinary: Negative.   Musculoskeletal: Negative.   Skin: Negative.   Neurological: Negative.   Endo/Heme/Allergies: Negative.   Psychiatric/Behavioral: Positive for depression. The patient does not have insomnia.     Blood pressure 132/81, pulse 76, temperature 98.4 F (36.9 C), temperature source Oral, resp. rate 16, height 6' (1.829 m), weight 71.215 kg (157 lb).Body mass index is 21.29 kg/(m^2).  General Appearance: Casual  Eye Contact::  Fair  Speech:  Slow  Volume:  Decreased  Mood:  Depressed  Affect:  Congruent  Thought Process:  Goal Directed  Orientation:  Full (Time, Place, and Person)  Thought Content:  Paranoid Ideation improving  Suicidal Thoughts:  No  Homicidal Thoughts:  No  Memory:  Immediate;   Good Recent;   Fair Remote;   Fair  Judgement:  Fair  Insight:  Lacking  Psychomotor Activity:  Decreased  Concentration:  Fair  Recall:  [employer] of Knowledge:Good  Language:  Good  Akathisia:  No  Handed:  Right  AIMS (if indicated):     Assets:  Communication Skills Desire for Improvement Leisure Time Physical Health Resilience Social Support  ADL's:  Intact  Cognition: WNL  Sleep:  Number of Hours: 6     Current Medications: Current Facility-Administered Medications  Medication Dose Route Frequency Provider Last Rate Last Dose  . alum & mag hydroxide-simeth (MAALOX/MYLANTA) 200-200-20 MG/5ML suspension 30 mL  30 mL Oral Q4H PRN Sanjuana Kava, NP   30 mL  at 07/28/15 2010  . divalproex (DEPAKOTE ER) 24 hr tablet 750 mg  750 mg Oral QHS Terrea Bruster, MD   750 mg at 08/02/15 2121  . haloperidol (HALDOL) tablet 5 mg  5 mg Oral Daily Craige Cotta, MD   5 mg at 08/03/15 1191  . hydrOXYzine (ATARAX/VISTARIL) tablet 25 mg  25 mg Oral Q4H PRN Sanjuana Kava, NP   25 mg at 07/31/15 1152  . magnesium hydroxide (MILK OF MAGNESIA) suspension 30 mL  30 mL Oral Daily PRN Sanjuana Kava, NP      . mirtazapine (REMERON) tablet 15 mg  15 mg Oral QHS Craige Cotta, MD   15 mg at 08/02/15 2121  . nicotine (NICODERM CQ - dosed in mg/24 hours) patch 14 mg  14 mg Transdermal Daily Jomarie Longs, MD   14 mg at 08/03/15 0845  . traZODone (DESYREL) tablet 50 mg  50 mg Oral QHS PRN Sanjuana Kava, NP        Lab Results:  Results for orders placed or performed during the hospital encounter of 07/28/15 (from the past 48 hour(s))  Valproic acid level     Status: Abnormal   Collection Time: 08/02/15  6:55 AM  Result Value Ref Range   Valproic Acid Lvl 46 (L) 50.0 - 100.0 ug/mL    Comment: Performed at [facility]    Physical Findings: AIMS: Facial and Oral Movements Muscles of Facial Expression: None, normal Lips and Perioral Area: None, normal Jaw: None, normal Tongue: None, normal,Extremity Movements Upper (arms, wrists, hands, fingers): None, normal Lower (legs, knees, ankles, toes): None, normal, Trunk Movements Neck, shoulders, hips: None, normal, Overall Severity Severity of abnormal movements (highest score from questions above): None, normal Incapacitation due to abnormal movements: None, normal Patient's awareness of abnormal movements (rate only patient's report): No Awareness, Dental Status Current problems with teeth and/or dentures?: No Does patient usually wear dentures?: No  CIWA:  CIWA-Ar Total: 1 COWS:  COWS Total Score: 1  Assessment: Pt is a 87 y old AAM with hx of psychosis , presented after he decompensated after  being noncompliant on his medications. Will continue medications as well as treatment.    Treatment Plan Summary: Daily contact with patient to assess and evaluate symptoms and progress in treatment and Medication management  Continue crisis management and stabilization.  Medication management:   -Continue Haldol 5 mg daily for psychotic symptoms -Increased Depakote ER to 750 mg po at bedtime for mood lability. Depakote level - 46 ug/ml ( 08/02/15). Will get another level in 5 days- 08/06/15. -Continue Remeron 15 mg hs for insomnia/depressive symptoms Encouraged patient to attend groups and participate in group counseling sessions and activities.  Discharge plan in progress.  Continue current treatment plan.  Address health issues: Vitals reviewed and stable.   Medical Decision Making:  Established Problem, Stable/Improving (1), Review of Psycho-Social Stressors (1), Review or order clinical lab tests (1), Review of  Medication Regimen & Side Effects (2) and Review of New Medication or Change in Dosage (2)  Renai Lopata, MD 08/03/2015, 2:39 PM

## 2015-08-04 MED ORDER — MIRTAZAPINE 15 MG PO TABS: 15.0000 mg | ORAL_TABLET | Freq: Every day | ORAL | Status: DC

## 2015-08-04 MED ORDER — DIVALPROEX SODIUM ER 250 MG PO TB24
750.0000 mg | ORAL_TABLET | Freq: Every day | ORAL | Status: DC
Start: 2015-08-04 — End: 2016-05-21

## 2015-08-04 MED ORDER — HALOPERIDOL 5 MG PO TABS: 5.0000 mg | ORAL_TABLET | Freq: Every day | ORAL | Status: DC

## 2015-08-04 MED ORDER — DIVALPROEX SODIUM ER 250 MG PO TB24: 750.0000 mg | ORAL_TABLET | Freq: Every day | ORAL | Status: DC

## 2015-08-04 MED ORDER — DIVALPROEX SODIUM ER 250 MG PO TB24
750.0000 mg | ORAL_TABLET | Freq: Every day | ORAL | Status: DC
  Filled 2015-08-04: qty 42
  Filled 2015-08-04: qty 3

## 2015-08-04 NOTE — Discharge Summary (Signed)
Physician Discharge Summary Note  Patient:  Austin Gutierrez is an 34 y.o., male MRN:  [id number] DOB:  1981-12-06 Patient phone:  [phone] (home)  Patient address:   [address] [place] [address],  Total Time spent with patient: 45 minutes  Date of Admission:  07/28/2015 Date of Discharge: 08/04/2015  Reason for Admission:  psychosis  Principal Problem: Schizoaffective disorder Discharge Diagnoses: Patient Active Problem List   Diagnosis Date Noted  . Schizoaffective disorder, depressive type [F25.1]   . Schizoaffective disorder [F25.9] 07/29/2015  . Insomnia [G47.00] 09/01/2014    Musculoskeletal: Strength & Muscle Tone: within normal limits Gait & Station: normal Patient leans: N/A  Psychiatric Specialty Exam: Physical Exam  Vitals reviewed. Psychiatric: His mood appears anxious. He does not exhibit a depressed mood. He expresses no homicidal and no suicidal ideation.    Review of Systems  Constitutional: Negative for fever.  Cardiovascular: Negative for chest pain.  Psychiatric/Behavioral: Negative for depression.  All other systems reviewed and are negative.   Blood pressure 126/76, pulse 82, temperature 97.6 F (36.4 C), temperature source Oral, resp. rate 20, height 6' (1.829 m), weight 71.215 kg (157 lb).Body mass index is 21.29 kg/(m^2).   General Appearance:  Neat  Eye Contact:: Good  Speech: Clear  Volume: Normal  Mood: Euthymic  Affect: Appropriate  Thought Process: Coherent  Orientation: Full (Time, Place, and Person)  Thought Content: WDL  Suicidal Thoughts: No  Homicidal Thoughts: No  Memory: Immediate; Fair Recent; Fair Remote; Fair  Judgement: Fair  Insight: Fair  Psychomotor Activity: Normal  Concentration: Fair  Recall: [employer] of Knowledge:Fair  Language: Fair  Akathisia: No  Handed: Right  AIMS (if indicated):    Assets: Desire for Improvement  Sleep: Number of Hours:  6.75  Cognition: WNL  ADL's: Intact       Have you used any form of tobacco in the last 30 days? (Cigarettes, Smokeless Tobacco, Cigars, and/or Pipes): Yes  Has this patient used any form of tobacco in the last 30 days? (Cigarettes, Smokeless Tobacco, Cigars, and/or Pipes) N/A  Past Medical History:  Past Medical History  Diagnosis Date  . Schizophrenia    History reviewed. No pertinent past surgical history. Family History:  Family History  Problem Relation Age of Onset  . Depression Other    Social History:  History  Alcohol Use No    Comment: denies regular alcohol use     History  Drug Use No    Social History   Social History  . Marital Status: Single    Spouse Name: N/A  . Number of Children: N/A  . Years of Education: N/A   Social History Main Topics  . Smoking status: Heavy Tobacco Smoker -- 0.50 packs/day    Types: Cigarettes  . Smokeless tobacco: None  . Alcohol Use: No     Comment: denies regular alcohol use  . Drug Use: No  . Sexual Activity: Yes   Other Topics Concern  . None   Social History Narrative   Risk to Self: Is patient at risk for suicide?: Yes Risk to Others:   Prior Inpatient Therapy:   Prior Outpatient Therapy:    Level of Care:  OP  Hospital Course:  Kyrin Gratz was admitted for Schizoaffective disorder and crisis management.  He was treated discharged with the medications listed below under Medication List.  Medical problems were identified and treated as needed.  Home medications were restarted as appropriate.  Improvement was monitored by  observation and Donney Caraveo daily report of symptom reduction.  Emotional and mental status was monitored by daily self-inventory reports completed by Zollie Beckers and clinical staff.         Que Meneely was evaluated by the treatment team for stability and plans for continued recovery upon discharge.  Jakarius Klumb motivation was an integral factor for scheduling further  treatment.  Employment, transportation, bed availability, health status, family support, and any pending legal issues were also considered during his hospital stay.  He was offered further treatment options upon discharge including but not limited to Residential, Intensive Outpatient, and Outpatient treatment.  July Nickson will follow up with the services as listed below under Follow Up Information.     Upon completion of this admission the patient was both mentally and medically stable for discharge denying suicidal/homicidal ideation, auditory/visual/tactile hallucinations, delusional thoughts and paranoia.      Consults:  psychiatry  Significant Diagnostic Studies:  labs: per ED  Discharge Vitals:   Blood pressure 126/76, pulse 82, temperature 97.6 F (36.4 C), temperature source Oral, resp. rate 20, height 6' (1.829 m), weight 71.215 kg (157 lb). Body mass index is 21.29 kg/(m^2). Lab Results:   Results for orders placed or performed during the hospital encounter of 07/28/15 (from the past 72 hour(s))  Valproic acid level     Status: Abnormal   Collection Time: 08/02/15  6:55 AM  Result Value Ref Range   Valproic Acid Lvl 46 (L) 50.0 - 100.0 ug/mL    Comment: Performed at [facility]    Physical Findings: AIMS: Facial and Oral Movements Muscles of Facial Expression: None, normal Lips and Perioral Area: None, normal Jaw: None, normal Tongue: None, normal,Extremity Movements Upper (arms, wrists, hands, fingers): None, normal Lower (legs, knees, ankles, toes): None, normal, Trunk Movements Neck, shoulders, hips: None, normal, Overall Severity Severity of abnormal movements (highest score from questions above): None, normal Incapacitation due to abnormal movements: None, normal Patient's awareness of abnormal movements (rate only patient's report): No Awareness, Dental Status Current problems with teeth and/or dentures?: No Does patient usually wear dentures?:  No  CIWA:  CIWA-Ar Total: 1 COWS:  COWS Total Score: 1   See Psychiatric Specialty Exam and Suicide Risk Assessment completed by Attending Physician prior to discharge.  Discharge destination:  Home  Is patient on multiple antipsychotic therapies at discharge:  No   Has Patient had three or more failed trials of antipsychotic monotherapy by history:  No  Recommended Plan for Multiple Antipsychotic Therapies: NA     Medication List    STOP taking these medications        benztropine 0.5 MG tablet  Commonly known as:  COGENTIN      TAKE these medications      Indication   divalproex 250 MG 24 hr tablet  Commonly known as:  DEPAKOTE ER  Take 3 tablets (750 mg total) by mouth at bedtime.   Indication:  mood stabilization     divalproex 250 MG 24 hr tablet  Commonly known as:  DEPAKOTE ER  Take 3 tablets (750 mg total) by mouth at bedtime.   Indication:  mood stabilization     haloperidol 5 MG tablet  Commonly known as:  HALDOL  Take 1 tablet (5 mg total) by mouth daily.   Indication:  mood stabilization     mirtazapine 15 MG tablet  Commonly known as:  REMERON  Take 1 tablet (15 mg total) by mouth at bedtime.  Indication:  Trouble Sleeping           Follow-up Information    Follow up with [facility]. Go in 2 days.   Why:  Go to the [facility] Mon-Fri 8am-3pm for follow-up.  The wait can be lengthy, so plan ahead by taking food, a drink, something to do with you.   Contact information:   [facility] [address]  [address] Tel:  [phone]      Follow up with [facility].   Why:  Call Melynda Ripple at this number next week to set up an appointment if you do not hear from her.   Contact information:   [address] [place] [phone]      Follow-up recommendations:  Activity:  as tol  Comments:  1.  Take all your medications as prescribed.              2.  Report any adverse side effects to  outpatient provider.                       3.  Patient instructed to not use alcohol or illegal drugs while on prescription medicines.            4.  In the event of worsening symptoms, instructed patient to call 911, the crisis hotline or go to nearest emergency room for evaluation of symptoms.  Total Discharge Time: 45 min  Signed: Velna Hatchet May Kanita Delage AGNP-BC 08/04/2015, 6:45 PM

## 2015-08-04 NOTE — Tx Team (Signed)
Interdisciplinary Treatment Plan Update (Adult)  Date:  08/04/2015   Time Reviewed:  9:09 AM   Progress in Treatment: Attending groups: Yes. Participating in groups:  Yes. Taking medication as prescribed:  Yes. Tolerating medication:  Yes. Family/Significant othe contact made:  No Patient understands diagnosis:  Yes  As evidenced by seeking help with depressin Discussing patient identified problems/goals with staff:  Yes, see initial care plan. Medical problems stabilized or resolved:  Yes. Denies suicidal/homicidal ideation: Yes. Issues/concerns per patient self-inventory:  No. Other:  New problem(s) identified:  Discharge Plan or Barriers: return home, follow up outpt  Reason for Continuation of Hospitalization:   Comments:  "I am a bit depressed, trying to figure out what is going on.' Austin Gutierrez is a 34 y.o. AA male who voluntarily presented to [facility] with SI/Depression and auditory hallucinations.  Patient seen and chart reviewed.Discussed patient with treatment team.  Pt today continues to feel depressed, more so about his situation.Pt with several psychosocial stressors like relational struggles with girlfriend as well as homelessness. Pt reports that his AH is improved . Pt has been tolerating all medications well.Depakote level reviewed. Discussed increasing the dose. Pt encouraged to attend groups and stay on medications. Per staff - pt appears depressed, withdrawn , limited historian , no disruptive issues noted on the unit.  Depakote, Haldol, Remeron trial  Estimated length of stay: d/c today  New goal(s):  Review of initial/current patient goals per problem list:   Review of initial/current patient goals per problem list:  1. Goal(s): Patient will participate in aftercare plan   Met: Yes   Target date: 3-5 days post admission date   As evidenced by: Patient will participate within aftercare plan AEB aftercare provider and housing plan at discharge  being identified.  08/01/15: Pt plans to return home, follow up outpt.     2. Goal (s): Patient will exhibit decreased depressive symptoms and suicidal ideations.   Met: Yes   Target date: 3-5 days post admission date   As evidenced by: Patient will utilize self rating of depression at 3 or below and demonstrate decreased signs of depression or be deemed stable for discharge by MD. 08/01/15 Pt rates his depression at a 5 today 08/04/2015 Rates his depression at a 4 today.  "It's manageable."     5. Goal(s): Patient will demonstrate decreased signs of psychosis  * Met: Yes  * Target date: 3-5 days post admission date  * As evidenced by: Patient will demonstrate decreased frequency of AVH or return to baseline function  08/01/15  C/O AH prior to admission.  Not taking any meds. 08/04/2015  No signs nor symptoms of psychosis today      Attendees: Patient:  08/04/2015 9:09 AM   Family:   08/04/2015 9:09 AM   Physician:  Ursula Alert, MD 08/04/2015 9:09 AM   Nursing:   Marcella Dubs, RN 08/04/2015 9:09 AM   CSW:    Roque Lias, LCSW   08/04/2015 9:09 AM   Other:  08/04/2015 9:09 AM   Other:   08/04/2015 9:09 AM   Other:  Lars Pinks, Nurse CM 08/04/2015 9:09 AM   Other:  Lucinda Dell, [facility] 08/04/2015 9:09 AM   Other:  Norberto Sorenson, [place]  08/04/2015 9:09 AM   Other:  08/04/2015 9:09 AM   Other:  08/04/2015 9:09 AM   Other:  08/04/2015 9:09 AM   Other:  08/04/2015 9:09 AM   Other:  08/04/2015 9:09 AM   Other:  08/04/2015 9:09 AM    Scribe for Treatment Team:   Roque Lias B, 08/04/2015 9:09 AM

## 2015-08-04 NOTE — [facility] Suicide Risk Assessment (Signed)
[facility] Discharge Suicide Risk Assessment   Demographic Factors:  Male  Total Time spent with patient: 30 minutes  Musculoskeletal: Strength & Muscle Tone: within normal limits Gait & Station: normal Patient leans: N/A  Psychiatric Specialty Exam: Physical Exam  Review of Systems  Psychiatric/Behavioral: Negative for depression and suicidal ideas. The patient is not nervous/anxious.   All other systems reviewed and are negative.   Blood pressure 126/76, pulse 82, temperature 97.6 F (36.4 C), temperature source Oral, resp. rate 20, height 6' (1.829 m), weight 71.215 kg (157 lb).Body mass index is 21.29 kg/(m^2).  General Appearance: Fairly Groomed  Patent attorney::  Fair  Speech:  Clear and Coherent409  Volume:  Normal  Mood:  Euthymic  Affect:  Appropriate  Thought Process:  Coherent  Orientation:  Full (Time, Place, and Person)  Thought Content:  WDL  Suicidal Thoughts:  No  Homicidal Thoughts:  No  Memory:  Immediate;   Fair Recent;   Fair Remote;   Fair  Judgement:  Fair  Insight:  Fair  Psychomotor Activity:  Normal  Concentration:  Fair  Recall:  [employer] of Knowledge:Fair  Language: Fair  Akathisia:  No  Handed:  Right  AIMS (if indicated):     Assets:  Desire for Improvement  Sleep:  Number of Hours: 6.75  Cognition: WNL  ADL's:  Intact   Have you used any form of tobacco in the last 30 days? (Cigarettes, Smokeless Tobacco, Cigars, and/or Pipes): Yes  Has this patient used any form of tobacco in the last 30 days? (Cigarettes, Smokeless Tobacco, Cigars, and/or Pipes) Yes, Prescription  provided WITH NICOTINE PATCH  Mental Status Per Nursing Assessment::   On Admission:  Suicidal ideation indicated by patient, Self-harm thoughts  Current Mental Status by Physician: Pt denies SI/HI/AH/VH  Loss Factors: NA  Historical Factors: Impulsivity  Risk Reduction Factors:   Positive social support  Continued Clinical Symptoms:  Previous Psychiatric Diagnoses  and Treatments  Cognitive Features That Contribute To Risk:  None    Suicide Risk:  Minimal: No identifiable suicidal ideation.  Patients presenting with no risk factors but with morbid ruminations; may be classified as minimal risk based on the severity of the depressive symptoms  Principal Problem: Schizoaffective disorder Discharge Diagnoses:  Patient Active Problem List   Diagnosis Date Noted  . Schizoaffective disorder [F25.9] 07/29/2015  . Insomnia [G47.00] 09/01/2014    Follow-up Information    Follow up with [facility]. Go in 2 days.   Why:  Go to the [facility] Mon-Fri 8am-3pm for follow-up.  The wait can be lengthy, so plan ahead by taking food, a drink, something to do with you.   Contact information:   [facility] [address] [place]  [address] Tel:  [phone]      Plan Of Care/Follow-up recommendations:  Activity:  No restrictions Diet:  regular Tests:  Follow up on Prolactin level Other:  none  Is patient on multiple antipsychotic therapies at discharge:  No   Has Patient had three or more failed trials of antipsychotic monotherapy by history:  No  Recommended Plan for Multiple Antipsychotic Therapies: NA    Evangelina Delancey md  08/04/2015, 9:28 AM

## 2015-08-04 NOTE — Progress Notes (Signed)
D/C instructions/meds/follow-up appointments reviewed, pt verbalized understanding, pt's belongings returned to pt, samples given. 

## 2016-02-29 ENCOUNTER — Encounter (HOSPITAL_COMMUNITY): Payer: Self-pay | Admitting: *Deleted

## 2016-02-29 ENCOUNTER — Emergency Department (INDEPENDENT_AMBULATORY_CARE_PROVIDER_SITE_OTHER)
Admission: EM | Admit: 2016-02-29 | Discharge: 2016-02-29 | Disposition: A | Discharge status: Home / Self Care | Payer: Self-pay | Source: Home / Self Care

## 2016-02-29 DIAGNOSIS — R634 Abnormal weight loss: Secondary | ICD-10-CM

## 2016-02-29 NOTE — Discharge Instructions (Signed)
See your doctor for further evaluation of weight loss.

## 2016-02-29 NOTE — ED Notes (Signed)
Pt  Reports    Symptoms    Of     A  19  Pound     Weight    Loss    Over  1  Year          He  denys  Any  Symptoms      He  Is  Requesting     Paper  Work     To clear  Him  To give  Plasma

## 2016-02-29 NOTE — ED Provider Notes (Signed)
CSN: [id number]     Arrival date & time 02/29/16  1351 History   None    Chief Complaint  Patient presents with  . Weight Loss   (Consider location/radiation/quality/duration/timing/severity/associated sxs/prior Treatment) Patient is a 35 y.o. male presenting with general illness. The history is provided by the patient.  Illness Severity:  Moderate Chronicity:  New Context:  19 lb wt  loss in 1 yr, here for check-up to give plasma. is heavy smoker. Associated symptoms: cough   Associated symptoms: no abdominal pain, no fever and no shortness of breath     Past Medical History  Diagnosis Date  . Schizophrenia (HCC)    History reviewed. No pertinent past surgical history. Family History  Problem Relation Age of Onset  . Depression Other    Social History  Substance Use Topics  . Smoking status: Heavy Tobacco Smoker -- 0.50 packs/day    Types: Cigarettes  . Smokeless tobacco: None  . Alcohol Use: No     Comment: denies regular alcohol use    Review of Systems  Constitutional: Negative.  Negative for fever.  Respiratory: Positive for cough. Negative for shortness of breath.   Cardiovascular: Negative.   Gastrointestinal: Negative for abdominal pain.  Hematological: Negative for adenopathy.  All other systems reviewed and are negative.   Allergies  Mushroom extract complex; Excedrin back &; Fish allergy; Motrin; and Onion  Home Medications   Prior to Admission medications   Medication Sig Start Date End Date Taking? Authorizing Provider  divalproex (DEPAKOTE ER) 250 MG 24 hr tablet Take 3 tablets (750 mg total) by mouth at bedtime. 08/04/15   Adonis BrookSheila Agustin, NP  divalproex (DEPAKOTE ER) 250 MG 24 hr tablet Take 3 tablets (750 mg total) by mouth at bedtime. 08/04/15   Adonis BrookSheila Agustin, NP  haloperidol (HALDOL) 5 MG tablet Take 1 tablet (5 mg total) by mouth daily. 08/04/15   Adonis BrookSheila Agustin, NP  mirtazapine (REMERON) 15 MG tablet Take 1 tablet (15 mg total) by mouth at  bedtime. 08/04/15   Adonis BrookSheila Agustin, NP   Meds Ordered and Administered this Visit  Medications - No data to display  BP 127/82 mmHg  Pulse 74  Temp(Src) 98.1 F (36.7 C)  Resp 16  SpO2 100% No data found.   Physical Exam  Constitutional: He is oriented to person, place, and time. He appears well-developed and well-nourished. No distress.  Neck: Normal range of motion. Neck supple.  Cardiovascular: Normal heart sounds.   Pulmonary/Chest: He has decreased breath sounds. He has rhonchi.  Lymphadenopathy:    He has no cervical adenopathy.  Neurological: He is alert and oriented to person, place, and time.  Skin: Skin is warm and dry.  Nursing note and vitals reviewed.   ED Course  Procedures (including critical care time)  Labs Review Labs Reviewed - No data to display  Imaging Review No results found.   Visual Acuity Review  Right Eye Distance:   Left Eye Distance:   Bilateral Distance:    Right Eye Near:   Left Eye Near:    Bilateral Near:         MDM   1. Unexplained weight loss        Linna HoffJames D Aspen Deterding, MD 02/29/16 1544

## 2016-03-08 ENCOUNTER — Inpatient Hospital Stay: Payer: Self-pay

## 2016-05-13 ENCOUNTER — Encounter (HOSPITAL_COMMUNITY): Payer: Self-pay

## 2016-05-13 ENCOUNTER — Emergency Department (HOSPITAL_COMMUNITY)
Admission: EM | Admit: 2016-05-13 | Discharge: 2016-05-14 | Disposition: A | Discharge status: Other Acute Inpatient Hospital | Payer: No Typology Code available for payment source | Attending: Emergency Medicine | Admitting: Emergency Medicine

## 2016-05-13 DIAGNOSIS — F251 Schizoaffective disorder, depressive type: Secondary | ICD-10-CM

## 2016-05-13 DIAGNOSIS — Z79899 Other long term (current) drug therapy: Secondary | ICD-10-CM | POA: Insufficient documentation

## 2016-05-13 DIAGNOSIS — F17210 Nicotine dependence, cigarettes, uncomplicated: Secondary | ICD-10-CM | POA: Insufficient documentation

## 2016-05-13 LAB — CBC
HCT: 45.7 % (ref 39.0–52.0)
Hemoglobin: 16.2 g/dL (ref 13.0–17.0)
MCH: 30.8 pg (ref 26.0–34.0)
MCHC: 35.4 g/dL (ref 30.0–36.0)
MCV: 86.9 fL (ref 78.0–100.0)
PLATELETS: 286 10*3/uL (ref 150–400)
RBC: 5.26 MIL/uL (ref 4.22–5.81)
RDW: 12.9 % (ref 11.5–15.5)
WBC: 6.7 10*3/uL (ref 4.0–10.5)

## 2016-05-13 LAB — RAPID URINE DRUG SCREEN, HOSP PERFORMED
AMPHETAMINES: NOT DETECTED
BENZODIAZEPINES: NOT DETECTED
Barbiturates: NOT DETECTED
COCAINE: NOT DETECTED
OPIATES: NOT DETECTED
Tetrahydrocannabinol: POSITIVE — AB

## 2016-05-13 LAB — COMPREHENSIVE METABOLIC PANEL
ALBUMIN: 4.6 g/dL (ref 3.5–5.0)
ALT: 14 U/L — ABNORMAL LOW (ref 17–63)
AST: 22 U/L (ref 15–41)
Alkaline Phosphatase: 40 U/L (ref 38–126)
Anion gap: 8 (ref 5–15)
BUN: 18 mg/dL (ref 6–20)
CHLORIDE: 105 mmol/L (ref 101–111)
CO2: 24 mmol/L (ref 22–32)
Calcium: 9.4 mg/dL (ref 8.9–10.3)
Creatinine, Ser: 1.25 mg/dL — ABNORMAL HIGH (ref 0.61–1.24)
GFR calc Af Amer: >60 mL/min (ref >60)
Glucose, Bld: 91 mg/dL (ref 65–99)
POTASSIUM: 3.6 mmol/L (ref 3.5–5.1)
Sodium: 137 mmol/L (ref 135–145)
Total Bilirubin: 0.8 mg/dL (ref 0.3–1.2)
Total Protein: 7.7 g/dL (ref 6.5–8.1)

## 2016-05-13 LAB — ACETAMINOPHEN LEVEL: Acetaminophen (Tylenol), Serum: <10 ug/mL — ABNORMAL LOW (ref 10–30)

## 2016-05-13 LAB — SALICYLATE LEVEL: Salicylate Lvl: <4 mg/dL (ref 2.8–30.0)

## 2016-05-13 LAB — ETHANOL

## 2016-05-13 MED ORDER — ZIPRASIDONE MESYLATE 20 MG IM SOLR
20.0000 mg | Freq: Once | INTRAMUSCULAR | Status: AC
  Administered 2016-05-13: 20 mg via INTRAMUSCULAR
  Filled 2016-05-13: qty 20

## 2016-05-13 MED ORDER — ZOLPIDEM TARTRATE 5 MG PO TABS: 5.0000 mg | ORAL_TABLET | Freq: Every evening | ORAL | Status: DC | PRN

## 2016-05-13 MED ORDER — LORAZEPAM 1 MG PO TABS: 1.0000 mg | ORAL_TABLET | Freq: Three times a day (TID) | ORAL | Status: DC | PRN

## 2016-05-13 MED ORDER — ALUM & MAG HYDROXIDE-SIMETH 200-200-20 MG/5ML PO SUSP: 30.0000 mL | ORAL | Status: DC | PRN

## 2016-05-13 MED ORDER — ONDANSETRON HCL 4 MG PO TABS
4.0000 mg | ORAL_TABLET | Freq: Three times a day (TID) | ORAL | Status: DC | PRN
Start: 2016-05-13 — End: 2016-05-14

## 2016-05-13 MED ORDER — DIPHENHYDRAMINE HCL 50 MG/ML IJ SOLN
50.0000 mg | Freq: Once | INTRAMUSCULAR | Status: AC
  Administered 2016-05-13: 50 mg via INTRAMUSCULAR
  Filled 2016-05-13: qty 1

## 2016-05-13 MED ORDER — LORAZEPAM 2 MG/ML IJ SOLN
2.0000 mg | Freq: Once | INTRAMUSCULAR | Status: AC
  Administered 2016-05-13: 2 mg via INTRAMUSCULAR
  Filled 2016-05-13: qty 1

## 2016-05-13 MED ORDER — STERILE WATER FOR INJECTION IJ SOLN
INTRAMUSCULAR | Status: AC
  Administered 2016-05-13: 1.2 mL
  Filled 2016-05-13: qty 10

## 2016-05-13 NOTE — ED Notes (Signed)
Patient refusing to have labs drawn or to change into burgundy scrubs.

## 2016-05-13 NOTE — ED Notes (Signed)
Assessor at bedside, [facility]

## 2016-05-13 NOTE — ED Notes (Signed)
Patient refused to let me collect labs or put his arm band on

## 2016-05-13 NOTE — ED Notes (Addendum)
Pt agitated, pacing, asking why is he here.  Pt has been off meds, speech is pressured.  Pt keeps stating I am not taking any meds, I am not taking any meds.

## 2016-05-13 NOTE — ED Provider Notes (Signed)
CSN: [id number]     Arrival date & time 05/13/16  1749 History   First MD Initiated Contact with Patient 05/13/16 1750     Chief Complaint  Patient presents with  . IVC   . Homicidal  . Suicidal     (Consider location/radiation/quality/duration/timing/severity/associated sxs/prior Treatment) HPI  The patient presents to the ER with psychosis. THe patient is not able to give me any history. He states that he doesn't know what is going on.  Past Medical History  Diagnosis Date  . Schizophrenia (HCC)    History reviewed. No pertinent past surgical history. Family History  Problem Relation Age of Onset  . Depression Other    Social History  Substance Use Topics  . Smoking status: Heavy Tobacco Smoker -- 0.50 packs/day    Types: Cigarettes  . Smokeless tobacco: None  . Alcohol Use: No     Comment: denies regular alcohol use    Review of Systems Level V caveat applies due to psychosis   Allergies  Mushroom extract complex; Excedrin back &; Fish allergy; Motrin; and Onion  Home Medications   Prior to Admission medications   Medication Sig Start Date End Date Taking? Authorizing Provider  divalproex (DEPAKOTE ER) 250 MG 24 hr tablet Take 3 tablets (750 mg total) by mouth at bedtime. 08/04/15   Adonis Brook, NP  divalproex (DEPAKOTE ER) 250 MG 24 hr tablet Take 3 tablets (750 mg total) by mouth at bedtime. 08/04/15   Adonis Brook, NP  haloperidol (HALDOL) 5 MG tablet Take 1 tablet (5 mg total) by mouth daily. 08/04/15   Adonis Brook, NP  mirtazapine (REMERON) 15 MG tablet Take 1 tablet (15 mg total) by mouth at bedtime. 08/04/15   Adonis Brook, NP   BP 143/94 mmHg  Pulse 114  Temp(Src) 98.2 F (36.8 C) (Oral)  Resp 18  Ht  (1.854 m)  Wt 78.472 kg  BMI 22.83 kg/m2  SpO2 93% Physical Exam  Constitutional: He is oriented to person, place, and time. He appears well-developed and well-nourished. No distress.  HENT:  Head: Normocephalic and atraumatic.   Mouth/Throat: Oropharynx is clear and moist.  Eyes: Pupils are equal, round, and reactive to light.  Neck: Normal range of motion. Neck supple.  Cardiovascular: Normal rate, regular rhythm and normal heart sounds.  Exam reveals no gallop and no friction rub.   No murmur heard. Pulmonary/Chest: Effort normal and breath sounds normal. No respiratory distress. He has no wheezes.  Abdominal: Soft. Bowel sounds are normal. He exhibits no distension. There is no tenderness.  Neurological: He is alert and oriented to person, place, and time. He exhibits normal muscle tone. Coordination normal.  Skin: Skin is warm and dry. No rash noted. No erythema.  Psychiatric: He has a normal mood and affect. His behavior is normal. Thought content is paranoid and delusional.  Nursing note and vitals reviewed.   ED Course  Procedures (including critical care time) Labs Review Labs Reviewed  COMPREHENSIVE METABOLIC PANEL - Abnormal; Notable for the following:    Creatinine, Ser 1.25 (*)    ALT 14 (*)    All other components within normal limits  ACETAMINOPHEN LEVEL - Abnormal; Notable for the following:    Acetaminophen (Tylenol), Serum <10 (*)    All other components within normal limits  URINE RAPID DRUG SCREEN, HOSP PERFORMED - Abnormal; Notable for the following:    Tetrahydrocannabinol POSITIVE (*)    All other components within normal limits  ETHANOL  SALICYLATE LEVEL  CBC    Imaging Review No results found. I have personally reviewed and evaluated these images and lab results as part of my medical decision-making.    MDM   Final diagnoses:  None    He will need TTS assessment.    Charlestine NightChristopher Ejay Lashley, PA-C 05/15/16 2219  Tilden FossaElizabeth Rees, MD 05/17/16 [id number]

## 2016-05-13 NOTE — ED Notes (Signed)
[employer] officers brought patient in.IVC papers taken out on patient by his mother. IVC papers state that the patient is schizophrenic and has not been taking his medicines. Patient states he has a knife on him and posted on facebook that he has a 40 caliber with a hollow point bullets and that someone killed his mom at her job. Respondent states that that someone is setting him up to kill him and that the FBI is blocking the streets. Patient is aggressive toward people and walking around and driving by making hand movements like he is shooting at others. Patient also stated that he is going to hill himself with a knife and does not care if he hurts people around him. [employer] stated that the patient did have a gun in his car.

## 2016-05-13 NOTE — ED Notes (Signed)
14 US dollars in cash, sunglasses, removed from patient, locked with security.

## 2016-05-13 NOTE — BH Assessment (Addendum)
Tele Assessment Note   Austin Gutierrez is an 35 y.o. male who presents to [facility] ED accompanied by Patent examinerlaw enforcement after being petitioned for involuntary commitment by his mother, Westly PamWanda Huntley [phone]. Per Affidavit and Petition: "Respondent has been diagnosed with schizophrenia. Respondent has not been taking medication. He is prescribed Haldol and some other unknown medication. Respondent is stating he has a knife on him and posted on Facebook that he has a 40-caliber with hollow point bullets and that someone killed his mom at her job. Respondent is stating that someone is setting him up to kill him and that the FBI is blocking streets. Respondent is acting aggressive towards people that are walking and driving by making hand movements like he is shooting at them. Respondent has also stated that he is going to kill himself with a knife and doesn't care if her hurts people around him."  In ED, Pt is agitated, has circumstantial thought process and is a poor historian. His explanation of events of the day is inconsistent. He states that his phone was broken and he couldn't reach his mother so he assumed she was dead. He is very focused that he was accused of having a knife and he didn't have a knife so he should be released.  He states he doesn't take psychiatric medications anymore and refuses to take them in the ED. He acknowledges decreased sleep. He acknowledges having access to a gun and [employer] report there was a gun in Pt's car. Pt denies thoughts of wanting to harm himself. When asked if he had thoughts of wanting to harm others he was evasive. Pt denies auditory or visual hallucinations. Pt denies alcohol or substance abuse but repeatedly says he wants to smoke cigarettes. Pt has a history of schizoaffective disorder and was [facility] due to severe depression, persecutory delusions and auditory command hallucinations to kill himself.   Pt is dressed in  hospital scrub bottoms with no shirt. He is alert, oriented x4 with emphatic speech and restless motor behavior. Eye contact is good. Pt's mood is angry and suspicious and affect is congruent with mood. Thought process is circumstantial with delusional content. Pt is refusing treatment and wants to be discharged.   Diagnosis: Schizoaffective Disorder  Past Medical History:  Past Medical History  Diagnosis Date  . Schizophrenia (HCC)     History reviewed. No pertinent past surgical history.  Family History:  Family History  Problem Relation Age of Onset  . Depression Other     Social History:  reports that he has been smoking Cigarettes.  He has been smoking about 0.50 packs per day. He does not have any smokeless tobacco history on file. He reports that he does not drink alcohol or use illicit drugs.  Additional Social History:  Alcohol / Drug Use Pain Medications: See MAR  Prescriptions: See MAR  Over the Counter: See mAR  History of alcohol / drug use?: No history of alcohol / drug abuse Longest period of sobriety (when/how long): NA  CIWA: CIWA-Ar BP: 143/94 mmHg Pulse Rate: 86 COWS:    PATIENT STRENGTHS: (choose at least two) Average or above average intelligence Capable of independent living Communication skills Physical Health Supportive family/friends  Allergies:  Allergies  Allergen Reactions  . Mushroom Extract Complex Nausea And Vomiting and Rash  . Excedrin Back & [Acetaminophen-Aspirin Buffered] Nausea And Vomiting  . Fish Allergy     "makes me breakout, sick on stomach, migraines  too"  . Motrin [Ibuprofen]   . Onion Nausea And Vomiting and Other (See Comments)    migraines    Home Medications:  (Not in a hospital admission)  OB/GYN Status:  No LMP for male patient.  General Assessment Data Location of Assessment: [facility] ED TTS Assessment: In system Is this a Tele or Face-to-Face Assessment?: Face-to-Face Is this an Initial Assessment or a  Re-assessment for this encounter?: Initial Assessment Marital status: Single Maiden name: NA Is patient pregnant?: No Pregnancy Status: No Living Arrangements: Other (Comment) (Unknown) Can pt return to current living arrangement?: Yes Admission Status: Involuntary Is patient capable of signing voluntary admission?: Yes Referral Source: Self/Family/Friend Insurance type: Self-pay     Crisis Care Plan Living Arrangements: Other (Comment) (Unknown) Legal Guardian: Other: (None) Name of Psychiatrist: None Name of Therapist: None  Education Status Is patient currently in school?: No Current Grade: NA Highest grade of school patient has completed: NA Name of school: NA Contact person: NA  Risk to self with the past 6 months Suicidal Ideation: Yes-Currently Present Has patient been a risk to self within the past 6 months prior to admission? : Yes Suicidal Intent: No Has patient had any suicidal intent within the past 6 months prior to admission? : No Is patient at risk for suicide?: Yes Suicidal Plan?: Yes-Currently Present Has patient had any suicidal plan within the past 6 months prior to admission? : Yes Specify Current Suicidal Plan: Per Pt's mother, cut himself with a knife Access to Means: Yes Specify Access to Suicidal Means: Access to sharps What has been your use of drugs/alcohol within the last 12 months?: Pt denies Previous Attempts/Gestures: No How many times?: 0 Other Self Harm Risks: Pt appears paranoid and agitated Triggers for Past Attempts: Unknown Intentional Self Injurious Behavior: None Family Suicide History: Unknown Recent stressful life event(s): Financial Problems Persecutory voices/beliefs?: Yes Depression: Yes Depression Symptoms: Insomnia, Feeling angry/irritable Substance abuse history and/or treatment for substance abuse?: No Suicide prevention information given to non-admitted patients: Not applicable  Risk to Others within the past 6  months Homicidal Ideation: No Does patient have any lifetime risk of violence toward others beyond the six months prior to admission? : No Thoughts of Harm to Others: Yes-Currently Present Comment - Thoughts of Harm to Others: Pt reported to be making shooting gestures at people Current Homicidal Intent: No Current Homicidal Plan: No Access to Homicidal Means: Yes Describe Access to Homicidal Means: Pt has access to a gun Identified Victim: No specific person History of harm to others?: No Assessment of Violence: None Noted Violent Behavior Description: No know history of violence Does patient have access to weapons?: Yes (Comment) (Pt has access to a gun) Criminal Charges Pending?: No Does patient have a court date: No Is patient on probation?: No  Psychosis Hallucinations: None noted Delusions: Persecutory (See assessment note)  Mental Status Report Appearance/Hygiene: Other (Comment) (Shirtless in scrub bottoms) Eye Contact: Good Motor Activity: Restlessness, Agitation Speech: Other (Comment) (Angry, emphatic) Level of Consciousness: Alert Mood: Suspicious, Angry, Preoccupied Affect: Angry Anxiety Level: Moderate Thought Processes: Circumstantial Judgement: Impaired Orientation: Person, Place, Time, Appropriate for developmental age, Situation Obsessive Compulsive Thoughts/Behaviors: None  Cognitive Functioning Concentration: Decreased Memory: Recent Intact, Remote Intact IQ: Average Insight: Poor Impulse Control: Fair Appetite: Fair Weight Loss: 0 Weight Gain: 0 Sleep: Decreased Total Hours of Sleep: 0 (Unknown) Vegetative Symptoms: None  ADLScreening [facility] Assessment Services) Patient's cognitive ability adequate to safely complete daily activities?: Yes Patient able to express need  for assistance with ADLs?: Yes Independently performs ADLs?: Yes (appropriate for developmental age)  Prior Inpatient Therapy Prior Inpatient Therapy: Yes Prior Therapy Dates:  07/2015, multiple admits Prior Therapy Facilty/Provider(s): [facility] Reason for Treatment: Schizoaffective disorder  Prior Outpatient Therapy Prior Outpatient Therapy: Yes Prior Therapy Dates: 2016 Prior Therapy Facilty/Provider(s): [facility] Reason for Treatment: Schizoaffectiv Does patient have an ACCT team?: No Does patient have Intensive In-House Services?  : No Does patient have [facility] services? : No Does patient have P4CC services?: No  ADL Screening (condition at time of admission) Patient's cognitive ability adequate to safely complete daily activities?: Yes Is the patient deaf or have difficulty hearing?: No Does the patient have difficulty seeing, even when wearing glasses/contacts?: No Does the patient have difficulty concentrating, remembering, or making decisions?: No Patient able to express need for assistance with ADLs?: Yes Does the patient have difficulty dressing or bathing?: No Independently performs ADLs?: Yes (appropriate for developmental age) Does the patient have difficulty walking or climbing stairs?: No Weakness of Legs: None Weakness of Arms/Hands: None  Home Assistive Devices/Equipment Home Assistive Devices/Equipment: None    Abuse/Neglect Assessment (Assessment to be complete while patient is alone) Physical Abuse: Denies Verbal Abuse: Denies Sexual Abuse: Denies Exploitation of patient/patient's resources: Denies Self-Neglect: Denies     Merchant navy officer (For Healthcare) Does patient have an advance directive?: No Would patient like information on creating an advanced directive?: No - patient declined information    Additional Information 1:1 In Past 12 Months?: No CIRT Risk: Yes Elopement Risk: Yes Does patient have medical clearance?: No     Disposition: Clint Bolder, AC at [facility], confirms adult unit is at capacity. Gave clinical report to Donell Sievert, PA-C who said Pt meets criteria for inpatient psychiatric treatment. TTS will  contact other facilities. Notified Charlestine Night, PA-C and Hansel Starling, RN of disposition.  Disposition Initial Assessment Completed for this Encounter: Yes Disposition of Patient: Inpatient treatment program Type of inpatient treatment program: Adult   Pamalee Leyden, [facility], [facility], [facility] Triage Specialist [phone]   Pamalee Leyden 05/13/2016 7:59 PM

## 2016-05-13 NOTE — ED Notes (Signed)
Labs drawn, changed into scrubs, security has wanded. [facility] per Trez[place]yer, [place]

## 2016-05-14 ENCOUNTER — Inpatient Hospital Stay
Admission: EM | Admit: 2016-05-14 | Discharge: 2016-05-21 | DRG: 885 | Disposition: A | Discharge status: Home / Self Care | Payer: No Typology Code available for payment source | Source: Intra-hospital | Attending: Psychiatry | Admitting: Psychiatry

## 2016-05-14 DIAGNOSIS — G4700 Insomnia, unspecified: Secondary | ICD-10-CM | POA: Diagnosis present

## 2016-05-14 DIAGNOSIS — Z886 Allergy status to analgesic agent status: Secondary | ICD-10-CM

## 2016-05-14 DIAGNOSIS — F251 Schizoaffective disorder, depressive type: Secondary | ICD-10-CM

## 2016-05-14 DIAGNOSIS — F17210 Nicotine dependence, cigarettes, uncomplicated: Secondary | ICD-10-CM | POA: Diagnosis present

## 2016-05-14 DIAGNOSIS — R45851 Suicidal ideations: Secondary | ICD-10-CM | POA: Diagnosis not present

## 2016-05-14 DIAGNOSIS — R45850 Homicidal ideations: Secondary | ICD-10-CM

## 2016-05-14 DIAGNOSIS — Z818 Family history of other mental and behavioral disorders: Secondary | ICD-10-CM | POA: Diagnosis not present

## 2016-05-14 DIAGNOSIS — Z9119 Patient's noncompliance with other medical treatment and regimen: Secondary | ICD-10-CM | POA: Diagnosis not present

## 2016-05-14 DIAGNOSIS — F250 Schizoaffective disorder, bipolar type: Principal | ICD-10-CM | POA: Diagnosis present

## 2016-05-14 DIAGNOSIS — F17200 Nicotine dependence, unspecified, uncomplicated: Secondary | ICD-10-CM

## 2016-05-14 HISTORY — DX: Schizoaffective disorder, unspecified: F25.9

## 2016-05-14 MED ORDER — MAGNESIUM HYDROXIDE 400 MG/5ML PO SUSP: 30.0000 mL | Freq: Every day | ORAL | Status: DC | PRN

## 2016-05-14 MED ORDER — ALUM & MAG HYDROXIDE-SIMETH 200-200-20 MG/5ML PO SUSP: 30.0000 mL | ORAL | Status: DC | PRN

## 2016-05-14 MED ORDER — HALOPERIDOL 5 MG PO TABS
5.0000 mg | ORAL_TABLET | Freq: Two times a day (BID) | ORAL | Status: DC
  Administered 2016-05-14 – 2016-05-15 (×2): 5 mg via ORAL
  Filled 2016-05-14: qty 1

## 2016-05-14 MED ORDER — HALOPERIDOL 5 MG PO TABS: 5.0000 mg | ORAL_TABLET | Freq: Every day | ORAL | Status: DC

## 2016-05-14 MED ORDER — DIVALPROEX SODIUM ER 500 MG PO TB24
500.0000 mg | ORAL_TABLET | Freq: Two times a day (BID) | ORAL | Status: DC
  Administered 2016-05-14: 500 mg via ORAL
  Filled 2016-05-14 (×2): qty 1

## 2016-05-14 MED ORDER — DIVALPROEX SODIUM ER 250 MG PO TB24: 750.0000 mg | ORAL_TABLET | Freq: Every day | ORAL | Status: DC

## 2016-05-14 MED ORDER — TRAZODONE HCL 100 MG PO TABS: 100.0000 mg | ORAL_TABLET | Freq: Every day | ORAL | Status: DC

## 2016-05-14 MED ORDER — HALOPERIDOL 5 MG PO TABS
ORAL_TABLET | ORAL | Status: AC
  Filled 2016-05-14: qty 1

## 2016-05-14 MED ORDER — BENZTROPINE MESYLATE 1 MG PO TABS
ORAL_TABLET | ORAL | Status: AC
  Filled 2016-05-14: qty 1

## 2016-05-14 MED ORDER — TRAZODONE HCL 100 MG PO TABS
ORAL_TABLET | ORAL | Status: AC
  Filled 2016-05-14: qty 1

## 2016-05-14 MED ORDER — HALOPERIDOL 5 MG PO TABS
5.0000 mg | ORAL_TABLET | Freq: Two times a day (BID) | ORAL | Status: DC
  Administered 2016-05-14: 5 mg via ORAL
  Filled 2016-05-14: qty 1

## 2016-05-14 MED ORDER — TRAZODONE HCL 100 MG PO TABS
100.0000 mg | ORAL_TABLET | Freq: Every day | ORAL | Status: DC
  Administered 2016-05-14: 100 mg via ORAL

## 2016-05-14 MED ORDER — DIVALPROEX SODIUM ER 500 MG PO TB24
500.0000 mg | ORAL_TABLET | Freq: Two times a day (BID) | ORAL | Status: DC
  Administered 2016-05-15: 500 mg via ORAL
  Filled 2016-05-14: qty 1

## 2016-05-14 MED ORDER — BENZTROPINE MESYLATE 1 MG PO TABS
0.5000 mg | ORAL_TABLET | Freq: Two times a day (BID) | ORAL | Status: DC
  Administered 2016-05-14: 1 mg via ORAL
  Filled 2016-05-14: qty 1

## 2016-05-14 MED ORDER — BENZTROPINE MESYLATE 1 MG PO TABS
0.5000 mg | ORAL_TABLET | Freq: Two times a day (BID) | ORAL | Status: DC
  Administered 2016-05-14 – 2016-05-15 (×2): 0.5 mg via ORAL
  Filled 2016-05-14: qty 1

## 2016-05-14 NOTE — ED Notes (Addendum)
1030-1100 spoke with patient after initial refusal of meds.  Built rapport, listened to patient's concerns- expressed some paranoid delusions about taking medicine "if I take these you promise you aren't going to tell the boys in black?" and "If I take these I can't get a gun."   Patient agreed to take medicine saying "this is for my mom."  It also seemed to help to call the depakote "the one for mood" haldol "the one for agitation."  Mother now in room to visit with patient.

## 2016-05-14 NOTE — BH Assessment (Signed)
Per Clint Bolderori Beck, [facility], adult unit is at capacity. Faxed clinical information to the following facilities for placement:  [facility] [address]   [facility] Constant[facility] Ellis Sophiarose Eades Jr, [facility], [facility], [facility] Triage Specialist [phone]

## 2016-05-14 NOTE — Consult Note (Addendum)
[place] Psychiat[facility] Consult   Reason for Consult:  Suicide threats and psychosis Referring Physician:  EDP Patient Identification: Austin Gutierrez MRN:  [id number] Principal Diagnosis: Schizoaffective disorder, depressive type ([place]) Diagnosis:   Patient Active Problem List   Diagnosis Date Noted  . Schizoaffective disorder, depressive type ([place]) [F25.1]     Priority: High  . Schizoaffective disorder ([place]) [F25.9] 07/29/2015  . Insomnia [G47.00] 09/01/2014    Total Time spent with patient: 45 minutes  Subjective:   Austin Gutierrez is a 34 y.o. male patient admitted with psychosis and suicidal behaviors.  HPI:  On admission:   Today, the patient is calm and drowsy after his PRN medications last night.  Past Psychiatric History: schizophrenia  Risk to Self: Suicidal Ideation: Yes-Currently Present Suicidal Intent: No Is patient at risk for suicide?: Yes Suicidal Plan?: Yes-Currently Present Specify Current Suicidal Plan: Per Pt's mother, cut himself with a knife Access to Means: Yes Specify Access to Suicidal Means: Access to sharps What has been your use of drugs/alcohol within the last 12 months?: Pt denies How many times?: 0 Other Self Harm Risks: Pt appears paranoid and agitated Triggers for Past Attempts: Unknown Intentional Self Injurious Behavior: None Risk to Others: Homicidal Ideation: No Thoughts of Harm to Others: Yes-Currently Present Comment - Thoughts of Harm to Others: Pt reported to be making shooting gestures at people Current Homicidal Intent: No Current Homicidal Plan: No Access to Homicidal Means: Yes Describe Access to Homicidal Means: Pt has access to a gun Identified Victim: No specific person History of harm to others?: No Assessment of Violence: None Noted Violent Behavior Description: No know history of violence Does patient have access to weapons?: Yes (Comment) (Pt has access to a gun) Criminal Charges Pending?: No Does patient have  a court date: No Prior Inpatient Therapy: Prior Inpatient Therapy: Yes Prior Therapy Dates: 07/2015, multiple admits Prior Therapy Facilty/Provider(s): [facility] Reason for Treatment: Schizoaffective disorder Prior Outpatient Therapy: Prior Outpatient Therapy: Yes Prior Therapy Dates: 2016 Prior Therapy Facilty/Provider(s): [facility] Reason for Treatment: Schizoaffectiv Does patient have an ACCT team?: No Does patient have Intensive In-House Services?  : No Does patient have [facility] services? : No Does patient have P4CC services?: No  Past Medical History:  Past Medical History  Diagnosis Date  . Schizophrenia ([place])    History reviewed. No pertinent past surgical history. Family History:  Family History  Problem Relation Age of Onset  . Depression Other    Family Psychiatric  History: none Social History:  History  Alcohol Use No    Comment: denies regular alcohol use     History  Drug Use No    Social History   Social History  . Marital Status: Single    Spouse Name: N/A  . Number of Children: N/A  . Years of Education: N/A   Social History Main Topics  . Smoking status: Heavy Tobacco Smoker -- 0.50 packs/day    Types: Cigarettes  . Smokeless tobacco: None  . Alcohol Use: No     Comment: denies regular alcohol use  . Drug Use: No  . Sexual Activity: Yes   Other Topics Concern  . None   Social History Narrative   Additional Social History:    Allergies:   Allergies  Allergen Reactions  . Mushroom Extract Complex Nausea And Vomiting and Rash  . Excedrin Back & [Acetaminophen-Aspirin Buffered] Nausea And Vomiting  . Fish Allergy     "makes me breakout, sick on stomach, migraines too"  .  Motrin [Ibuprofen]   . Onion Nausea And Vomiting and Other (See Comments)    migraines    Labs:  Results for orders placed or performed during the hospital encounter of 05/13/16 (from the past 48 hour(s))  Comprehensive metabolic panel     Status: Abnormal    Collection Time: 05/13/16  7:15 PM  Result Value Ref Range   Sodium 137 135 - 145 mmol/L   Potassium 3.6 3.5 - 5.1 mmol/L   Chloride 105 101 - 111 mmol/L   CO2 24 22 - 32 mmol/L   Glucose, Bld 91 65 - 99 mg/dL   BUN 18 6 - 20 mg/dL   Creatinine, Ser 1.25 (H) 0.61 - 1.24 mg/dL   Calcium 9.4 8.9 - 10.3 mg/dL   Total Protein 7.7 6.5 - 8.1 g/dL   Albumin 4.6 3.5 - 5.0 g/dL   AST 22 15 - 41 U/L   ALT 14 (L) 17 - 63 U/L   Alkaline Phosphatase 40 38 - 126 U/L   Total Bilirubin 0.8 0.3 - 1.2 mg/dL   GFR calc non Af Amer >60 >60 mL/min   GFR calc Af Amer >60 >60 mL/min    Comment: (NOTE) The eGFR has been calculated using the CKD EPI equation. This calculation has not been validated in all clinical situations. eGFR's persistently <60 mL/min signify possible Chronic Kidney Disease.    Anion gap 8 5 - 15  cbc     Status: None   Collection Time: 05/13/16  7:15 PM  Result Value Ref Range   WBC 6.7 4.0 - 10.5 K/uL   RBC 5.26 4.22 - 5.81 MIL/uL   Hemoglobin 16.2 13.0 - 17.0 g/dL   HCT 45.7 39.0 - 52.0 %   MCV 86.9 78.0 - 100.0 fL   MCH 30.8 26.0 - 34.0 pg   MCHC 35.4 30.0 - 36.0 g/dL   RDW 12.9 11.5 - 15.5 %   Platelets 286 150 - 400 K/uL  Ethanol     Status: None   Collection Time: 05/13/16  7:16 PM  Result Value Ref Range   Alcohol, Ethyl (B) <5 <5 mg/dL    Comment:        LOWEST DETECTABLE LIMIT FOR SERUM ALCOHOL IS 5 mg/dL FOR MEDICAL PURPOSES ONLY   Salicylate level     Status: None   Collection Time: 05/13/16  7:16 PM  Result Value Ref Range   Salicylate Lvl <8.3 2.8 - 30.0 mg/dL  Acetaminophen level     Status: Abnormal   Collection Time: 05/13/16  7:16 PM  Result Value Ref Range   Acetaminophen (Tylenol), Serum <10 (L) 10 - 30 ug/mL    Comment:        THERAPEUTIC CONCENTRATIONS VARY SIGNIFICANTLY. A RANGE OF 10-30 ug/mL MAY BE AN EFFECTIVE CONCENTRATION FOR MANY PATIENTS. HOWEVER, SOME ARE BEST TREATED AT CONCENTRATIONS OUTSIDE THIS RANGE. ACETAMINOPHEN  CONCENTRATIONS >150 ug/mL AT 4 HOURS AFTER INGESTION AND >50 ug/mL AT 12 HOURS AFTER INGESTION ARE OFTEN ASSOCIATED WITH TOXIC REACTIONS.   Rapid urine drug screen (hospital performed)     Status: Abnormal   Collection Time: 05/13/16  8:11 PM  Result Value Ref Range   Opiates NONE DETECTED NONE DETECTED   Cocaine NONE DETECTED NONE DETECTED   Benzodiazepines NONE DETECTED NONE DETECTED   Amphetamines NONE DETECTED NONE DETECTED   Tetrahydrocannabinol POSITIVE (A) NONE DETECTED   Barbiturates NONE DETECTED NONE DETECTED    Comment:        DRUG SCREEN FOR  MEDICAL PURPOSES ONLY.  IF CONFIRMATION IS NEEDED FOR ANY PURPOSE, NOTIFY LAB WITHIN 5 DAYS.        LOWEST DETECTABLE LIMITS FOR URINE DRUG SCREEN Drug Class       Cutoff (ng/mL) Amphetamine      1000 Barbiturate      200 Benzodiazepine   277 Tricyclics       824 Opiates          300 Cocaine          300 THC              50     Current Facility-Administered Medications  Medication Dose Route Frequency Provider Last Rate Last Dose  . alum & mag hydroxide-simeth (MAALOX/MYLANTA) 200-200-20 MG/5ML suspension 30 mL  30 mL Oral PRN Dalia Heading, PA-C      . benztropine (COGENTIN) tablet 0.5 mg  0.5 mg Oral BID Roscoe Witts, MD      . divalproex (DEPAKOTE ER) 24 hr tablet 500 mg  500 mg Oral BID PC Nur Rabold, MD      . haloperidol (HALDOL) tablet 5 mg  5 mg Oral BID Marvel Sapp, MD      . LORazepam (ATIVAN) tablet 1 mg  1 mg Oral Q8H PRN Christopher Lawyer, PA-C      . ondansetron (ZOFRAN) tablet 4 mg  4 mg Oral Q8H PRN Christopher Lawyer, PA-C      . traZODone (DESYREL) tablet 100 mg  100 mg Oral QHS Corena Pilgrim, MD       Current Outpatient Prescriptions  Medication Sig Dispense Refill  . divalproex (DEPAKOTE ER) 250 MG 24 hr tablet Take 3 tablets (750 mg total) by mouth at bedtime. 90 tablet 0  . divalproex (DEPAKOTE ER) 250 MG 24 hr tablet Take 3 tablets (750 mg total) by mouth at bedtime. 90 tablet  0  . haloperidol (HALDOL) 5 MG tablet Take 1 tablet (5 mg total) by mouth daily. 30 tablet 0  . mirtazapine (REMERON) 15 MG tablet Take 1 tablet (15 mg total) by mouth at bedtime. 30 tablet 0    Musculoskeletal: Strength & Muscle Tone: within normal limits Gait & Station: normal Patient leans: N/A  Psychiatric Specialty Exam: Physical Exam  Constitutional: He is oriented to person, place, and time. He appears well-developed and well-nourished.  HENT:  Head: Normocephalic.  Neck: Normal range of motion.  Respiratory: Effort normal.  Musculoskeletal: Normal range of motion.  Neurological: He is alert and oriented to person, place, and time.  Skin: Skin is warm and dry.  Psychiatric: His speech is normal. His mood appears anxious. He is aggressive and actively hallucinating. Thought content is paranoid. Cognition and memory are impaired. He expresses impulsivity and inappropriate judgment. He exhibits a depressed mood. He expresses suicidal ideation. He expresses suicidal plans.    Review of Systems  Constitutional: Negative.   HENT: Negative.   Eyes: Negative.   Respiratory: Negative.   Cardiovascular: Negative.   Gastrointestinal: Negative.   Genitourinary: Negative.   Musculoskeletal: Negative.   Skin: Negative.   Neurological: Negative.   Endo/Heme/Allergies: Negative.   Psychiatric/Behavioral: Positive for depression, suicidal ideas and hallucinations.    Blood pressure 120/85, pulse 98, temperature 98.2 F (36.8 C), temperature source Oral, resp. rate 16, height _0  (1.854 m), weight 78.472 kg (173 lb), SpO2 100 %.Body mass index is 22.83 kg/(m^2).  General Appearance: Disheveled  Eye Contact:  Fair  Speech:  Normal Rate  Volume:  Decreased  Mood:  Depressed  Affect:  Flat  Thought Process:  Disorganized  Orientation:  Other:  person  Thought Content:  Delusions, Hallucinations: Auditory Visual and Paranoid Ideation  Suicidal Thoughts:  Yes.  with intent/plan   Homicidal Thoughts:  Yes.  without intent/plan  Memory:  Immediate;   Poor Recent;   Poor Remote;   Poor  Judgement:  Impaired  Insight:  Lacking  Psychomotor Activity:  Decreased  Concentration:  Concentration: Fair and Attention Span: Fair  Recall:  Poor  Fund of Knowledge:  Fair  Language:  Fair  Akathisia:  No  Handed:  Right  AIMS (if indicated):     Assets:  Housing Leisure Time Physical Health Resilience Social Support  ADL's:  Intact  Cognition:  Impaired,  Moderate  Sleep:        Treatment Plan Summary: Daily contact with patient to assess and evaluate symptoms and progress in treatment, Medication management and Plan schizophrenia, depressed type:  -Crisis stabilization -Medication management:  Start Depakote 500 mg BID for mood stabilization, Haldol 5 mg BID for psychosis, Ativan 1 mg every 8 hours PRN anxiety, Trazodone 100 mg at bedtime for sleep, and Cogentin 0.5 mg BID for EPS -Individual counseling  Disposition: Recommend psychiatric Inpatient admission when medically cleared.  Waylan Boga, NP 05/14/2016 10:17 AM Patient seen face-to-face for psychiatric evaluation, chart reviewed and case discussed with the physician extender and developed treatment plan. Reviewed the information documented and agree with the treatment plan. Corena Pilgrim, MD

## 2016-05-14 NOTE — Progress Notes (Signed)
CM spoke with pt who confirms uninsured [place] resident with no pcp.  CM discussed and provided written information to assist pt with determining choice for uninsured accepting pcps, discussed the importance of pcp vs EDP services for f/u care, www.needymeds.org, www.goodrx.com, discounted pharmacies and other [place] resources such as [facility] , [facility], affordable care act, financial assistance, uninsured dental services, [place] med assist, DSS and  health department  Reviewed resources for [place] uninsured accepting pcps like [facility], [facility] [address], [facility], [facility], local urgent care centers, [facility], [facility], general medical clinics, [facility], [facility] plus others, medication resources, [facility] out patient pharmacies and housing Pt voiced understanding and appreciation of resources provided   Provided [facility] contact information Pt agreed to a referral Cm completed referral Pt to be contact by [facility] clinical liason  

## 2016-05-14 NOTE — ED Notes (Signed)
Pt is very upset that he is going to [facility]. He said, "I want to be home with my Lexus smoking a cigarette." Since learning that [place] is going to a hospital in [place] he has been more anxious and agitated. He is concerned about how he is going to manage his life while in the hospital. He refused his evening Depakote

## 2016-05-14 NOTE — ED Notes (Signed)
Pt left with the Sheriff. He complained about going and procrastinated, but left.

## 2016-05-14 NOTE — BH Assessment (Signed)
BHH Assessment Progress Note  Per Thedore MinsMojeed Akintayo, MD, this pt requires psychiatric hospitalization at this time.  At 15:30 Berneice Heinrichina Tate, RN, [facility] calls to report that pt has been accepted to [facility], Rm [address], by Dr Ardyth HarpsHernandez.  Nanine MeansJamison Lord, DNP, concurs with this decision.  Pt presents under IVC, and IVC documents have been faxed to [phone].  [place] [facility] [facility].  Pt's nurse, Diane, has been notified.  She agrees to call report to [phone].  Doylene Canninghomas Amatullah Christy, MA Triage Specialist [phone]

## 2016-05-14 NOTE — BH Assessment (Signed)
BHH Assessment Progress Note [facility] [place].

## 2016-05-14 NOTE — ED Notes (Signed)
Pt continues to be upset that he is being transported to [facility]. [place].

## 2016-05-15 ENCOUNTER — Encounter: Payer: Self-pay | Admitting: Psychiatry

## 2016-05-15 DIAGNOSIS — F17200 Nicotine dependence, unspecified, uncomplicated: Secondary | ICD-10-CM

## 2016-05-15 DIAGNOSIS — F250 Schizoaffective disorder, bipolar type: Secondary | ICD-10-CM

## 2016-05-15 MED ORDER — NICOTINE 21 MG/24HR TD PT24
21.0000 mg | MEDICATED_PATCH | Freq: Every day | TRANSDERMAL | Status: DC
  Filled 2016-05-15: qty 1

## 2016-05-15 MED ORDER — LORAZEPAM 1 MG PO TABS
1.0000 mg | ORAL_TABLET | Freq: Every day | ORAL | Status: DC
  Administered 2016-05-15: 1 mg via ORAL
  Filled 2016-05-15: qty 1

## 2016-05-15 MED ORDER — PALIPERIDONE ER 3 MG PO TB24
9.0000 mg | ORAL_TABLET | Freq: Every day | ORAL | Status: DC
  Administered 2016-05-15: 9 mg via ORAL
  Filled 2016-05-15: qty 3

## 2016-05-15 NOTE — Progress Notes (Signed)
Recreation Therapy Notes  INPATIENT RECREATION THERAPY ASSESSMENT  Patient Details Name: Austin Gutierrez MRN: [id number] DOB: Jul 23, 1981 Today's Date: 05/15/2016  Patient Stressors: Work, Other (Comment) Was working at [employer], but doesn't think he has a job. Thought he got a letter from the company saying he was going to get money each month. Has not seen the money and thinks cousin or cousin's boyfriend has something to do with it. Cousin and boyfriend are back together and patient is worried about the [id number] year old son living in the house. Patient says there are drugs and guns in the house; finances  Coping Skills:   Avoidance, Exercise, Art/Dance, Talking, Music, Sports  Personal Challenges: Anger, Relationships, Self-Esteem/Confidence, Time Management, Trusting Others  Leisure Interests (2+):  Individual - Other (Comment) (Go to arcade with younger cousin, ride around town by himself at night)  Awareness of Community Resources:  Yes  Community Resources:  Gym, Other (Comment) Building surveyor(Pool)  Current Use: Yes  If no, Barriers?:    Patient Strengths:  Caring, respectful  Patient Identified Areas of Improvement:  Finances  Current Recreation Participation:  Chilling out at home  Patient Goal for Hospitalization:  Try to see what's going on with him and get back to life  [place]  [place]   Current [place] (including self-harm):  No  Current HI:  No  Consent to Intern Participation: N/A   Jacquelynn CreeGreene,Austin Gutierrez, LRT/CTRS 05/15/2016, 3:58 PM

## 2016-05-15 NOTE — BHH Group Notes (Signed)
BHH Group Notes:  (Nursing/MHT/Case Management/Adjunct)  Date:  05/15/2016  Time:  4:24 PM  Type of Therapy:  Psychoeducational Skills  Participation Level:  Active  Participation Quality:  Appropriate, Attentive and Sharing  Affect:  Appropriate  Cognitive:  Alert and Appropriate  Insight:  Appropriate and Good  Engagement in Group:  Engaged  Modes of Intervention:  Discussion, Education and Support  Summary of Progress/Problems:  Lynelle SmokeCara Travis Shauntell Iglesia 05/15/2016, 4:24 PM

## 2016-05-15 NOTE — Plan of Care (Signed)
Problem: Safety: Goal: Ability to redirect hostility and anger into socially appropriate behaviors will improve Outcome: Progressing Pt calm, pleasant and cooperative

## 2016-05-15 NOTE — H&P (Signed)
Psychiatric Admission Assessment Adult  Patient Identification: Austin Gutierrez MRN:  [id number] Date of Evaluation:  05/15/2016 Chief Complaint:  Schophrenia Principal Diagnosis: Schizoaffective disorder, bipolar type ([place]) Diagnosis:   Patient Active Problem List   Diagnosis Date Noted  . Tobacco use disorder [F17.200] 05/15/2016  . Schizoaffective disorder, bipolar type ([place]) [F25.0] 05/15/2016   History of Present Illness:   Patient is a 35 year old single African-American male with prior diagnosis of schizoaffective disorder bipolar type.   The patient presented to to [facility] emergency department by  law enforcement on 5/29 after being petitioned for involuntary commitment by his mother, Austin Gutierrez [phone]. Per Affidavit and Petition: "Respondent has been diagnosed with schizophrenia. Respondent has not been taking medication. He is prescribed Haldol and some other unknown medication. Respondent is stating he has a knife on him and posted on Facebook that he has a 40-caliber with hollow point bullets and that someone killed his mom at her job. Respondent is stating that someone is setting him up to kill him and that the FBI is blocking streets. Respondent is acting aggressive towards people that are walking and driving by making hand movements like he is shooting at them. Respondent has also stated that he is going to kill himself with a knife and doesn't care if it hurts people around him."  In ED, patient was agitated, has circumstantial thought process and is a poor historian. His explanation of events of the day is inconsistent. Per [facility] police there was a gun in the patient's car.   Patient was last inpatient at [facility] in August 2016 due to severe depression, persecutory delusions and auditory command hallucinations to kill himself.   Today during the assessment the patient tells me he does not understand why he was putting into the hospital. He said he has done  nothing to hurt himself or anybody else. Assessment the patient appears to have very limited insight into his illness. He appears to be somewhat grandiose has been telling the nurses staff that he drives fancy car and that most likely They have seen him driving around [place]. 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 He denied to me having depressed mood, problems with his sleep, appetite energy or concentration. He denies suicidality, homicidality or having auditory or visual hallucinations. He denies any symptoms consistent with mania or hypomania. He denies any history of trauma.  Substance abuse history the patient tells me he smokes about one pack of cigarettes per day. He smokes marijuana (once in a while). He denies the use of alcohol or any other illicit substances  Associated Signs/Symptoms: Depression Symptoms:  denies (Hypo) Manic Symptoms:  Elevated Mood, Grandiosity, Impulsivity, Anxiety Symptoms:  denies Psychotic Symptoms:  Paranoia, PTSD Symptoms: Negative   Total Time spent with patient: 1 hour  Past Psychiatric History: Patient has been hospitalized outcome behavioral in [place] in 2015 and 2016. He has been diagnosed with schizoaffective disorder bipolar type. The patient is currently not following our outpatient. He is not currently taking any psychiatric medications. He denies any history of prior suicidal attempts  Is the patient at risk to self? Yes.    Has the patient been a risk to self in the past 6 months? No.  Has the patient been a risk to self within the distant past? No.  Is the patient a risk to others? No.  Has the patient been a risk to others in the past 6 months? No.  Has the  patient been a risk to others within the distant past? No.    Past Medical History: Patient denies any history of seizures. 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 Past  Medical History  Diagnosis Date  . Schizoaffective disorder ([place])    History reviewed. No pertinent past surgical history.  Family History:  Family History  Problem Relation Age of Onset  . Depression Other    Family Psychiatric  History: Patient reports that one of his uncles was was "patient's of in the head". He reports having a cousin with a disability. He believes his aunt has tried to commit suicide.  Patient reports that his mother in the past used to abuse alcohol and drugs. He also reports having an uncle who abuses alcohol and drugs.  Tobacco Screening: Patient is proximal half a pack a day  Social History: Patient tells me he was raised by his grandmother. He is currently living with his cousin in [place]. He is single and couldn't answer when I ask him if he had children are not. As far as education the patient isn't sure where he completed 10 or 11. Patient tells me in the past he was charged with larceny in 2011 with possession however he says both charges were dismissed. He denies having any current legal charges pending History  Alcohol Use No    Comment: denies regular alcohol use     History  Drug Use  . Yes  . Special: Marijuana    Additional Social History: Marital status: Single Does patient have children?: Yes How is patient's relationship with their children?: Pt reports he "thinks he has one", but it is hard to say      Allergies:   Allergies  Allergen Reactions  . Mushroom Extract Complex Nausea And Vomiting and Rash  . Excedrin Back & [Acetaminophen-Aspirin Buffered] Nausea And Vomiting  . Fish Allergy     "makes me breakout, sick on stomach, migraines too"  . Onion Nausea And Vomiting and Other (See Comments)    migraines   Lab Results:  Results for orders placed or performed during the hospital encounter of 05/13/16 (from the past 48 hour(s))  Comprehensive metabolic panel     Status: Abnormal   Collection Time: 05/13/16  7:15 PM  Result  Value Ref Range   Sodium 137 135 - 145 mmol/L   Potassium 3.6 3.5 - 5.1 mmol/L   Chloride 105 101 - 111 mmol/L   CO2 24 22 - 32 mmol/L   Glucose, Bld 91 65 - 99 mg/dL   BUN 18 6 - 20 mg/dL   Creatinine, Ser 1.25 (H) 0.61 - 1.24 mg/dL   Calcium 9.4 8.9 - 10.3 mg/dL   Total Protein 7.7 6.5 - 8.1 g/dL   Albumin 4.6 3.5 - 5.0 g/dL   AST 22 15 - 41 U/L   ALT 14 (L) 17 - 63 U/L   Alkaline Phosphatase 40 38 - 126 U/L   Total Bilirubin 0.8 0.3 - 1.2 mg/dL   GFR calc non Af Amer >60 >60 mL/min   GFR calc Af Amer >60 >60 mL/min    Comment: (NOTE) The eGFR has been calculated using the CKD EPI equation. This calculation has not been validated in all clinical situations. eGFR's persistently <60 mL/min signify possible Chronic Kidney Disease.    Anion gap 8 5 - 15  cbc     Status: None   Collection Time: 05/13/16  7:15 PM  Result Value Ref Range   WBC 6.7 4.0 - 10.5 K/uL   RBC 5.26 4.22 - 5.81 MIL/uL   Hemoglobin 16.2 13.0 - 17.0 g/dL   HCT 45.7 39.0 - 52.0 %   MCV 86.9 78.0 - 100.0 fL   MCH 30.8 26.0 - 34.0 pg   MCHC 35.4 30.0 - 36.0 g/dL   RDW 12.9 11.5 - 15.5 %   Platelets 286 150 - 400 K/uL  Ethanol     Status: None   Collection Time: 05/13/16  7:16 PM  Result Value Ref Range   Alcohol, Ethyl (B) <5 <5 mg/dL    Comment:        LOWEST DETECTABLE LIMIT FOR SERUM ALCOHOL IS 5 mg/dL FOR MEDICAL PURPOSES ONLY   Salicylate level     Status: None   Collection Time: 05/13/16  7:16 PM  Result Value Ref Range   Salicylate Lvl <7.6 2.8 - 30.0 mg/dL  Acetaminophen level     Status: Abnormal   Collection Time: 05/13/16  7:16 PM  Result Value Ref Range   Acetaminophen (Tylenol), Serum <10 (L) 10 - 30 ug/mL    Comment:        THERAPEUTIC CONCENTRATIONS VARY SIGNIFICANTLY. A RANGE OF 10-30 ug/mL MAY BE AN EFFECTIVE CONCENTRATION FOR MANY PATIENTS. HOWEVER, SOME ARE BEST TREATED AT CONCENTRATIONS OUTSIDE THIS RANGE. ACETAMINOPHEN CONCENTRATIONS >150 ug/mL AT 4 HOURS  AFTER INGESTION AND >50 ug/mL AT 12 HOURS AFTER INGESTION ARE OFTEN ASSOCIATED WITH TOXIC REACTIONS.   Rapid urine drug screen (hospital performed)     Status: Abnormal   Collection Time: 05/13/16  8:11 PM  Result Value Ref Range   Opiates NONE DETECTED NONE DETECTED   Cocaine NONE DETECTED NONE DETECTED   Benzodiazepines NONE DETECTED NONE DETECTED   Amphetamines NONE DETECTED NONE DETECTED   Tetrahydrocannabinol POSITIVE (A) NONE DETECTED   Barbiturates NONE DETECTED NONE DETECTED    Comment:        DRUG SCREEN FOR MEDICAL PURPOSES ONLY.  IF CONFIRMATION IS NEEDED FOR ANY PURPOSE, NOTIFY LAB WITHIN 5 DAYS.        LOWEST DETECTABLE LIMITS FOR URINE DRUG SCREEN Drug Class       Cutoff (ng/mL) Amphetamine      1000 Barbiturate      200 Benzodiazepine   283 Tricyclics       151 Opiates          300 Cocaine          300 THC              50     Blood Alcohol level:  Lab Results  Component Value Date   ETH <5 05/13/2016   ETH <5 76/16/0737    Metabolic Disorder Labs:  Lab Results  Component Value Date   HGBA1C 5.7* 07/30/2015   MPG 117 07/30/2015   MPG 120* 05/25/2014   Lab Results  Component Value Date   PROLACTIN 24.5* 08/01/2015   Lab Results  Component Value Date   CHOL 137 07/30/2015   TRIG 54 07/30/2015   HDL 51 07/30/2015   CHOLHDL 2.7 07/30/2015   VLDL 11 07/30/2015   LDLCALC 75 07/30/2015    Current Medications: Current Facility-Administered Medications  Medication Dose Route Frequency Provider Last Rate Last Dose  . alum & mag hydroxide-simeth (MAALOX/MYLANTA) 200-200-20 MG/5ML suspension 30 mL  30 mL Oral Q4H PRN Patrecia Pour, NP      . LORazepam (ATIVAN) tablet 1 mg  1 mg Oral QHS Hildred Priest, MD      . magnesium hydroxide (MILK OF MAGNESIA) suspension 30 mL  30 mL Oral Daily PRN Patrecia Pour, NP      . nicotine (NICODERM CQ - dosed in mg/24 hours) patch 21 mg  21 mg Transdermal Daily Hildred Priest, MD      .  paliperidone (INVEGA) 24 hr tablet 9 mg  9 mg Oral QHS Hildred Priest, MD       PTA Medications: Prescriptions prior to admission  Medication Sig Dispense Refill Last Dose  . divalproex (DEPAKOTE ER) 250 MG 24 hr tablet Take 3 tablets (750 mg total) by mouth at bedtime. (Patient not taking: Reported on 05/14/2016) 90 tablet 0 Not Taking at Unknown time  . divalproex (DEPAKOTE ER) 250 MG 24 hr tablet Take 3 tablets (750 mg total) by mouth at bedtime. (Patient not taking: Reported on 05/14/2016) 90 tablet 0 Not Taking at Unknown time  . haloperidol (HALDOL) 5 MG tablet Take 1 tablet (5 mg total) by mouth daily. (Patient not taking: Reported on 05/14/2016) 30 tablet 0 Not Taking at Unknown time  . mirtazapine (REMERON) 15 MG tablet Take 1 tablet (15 mg total) by mouth at bedtime. (Patient not taking: Reported on 05/14/2016) 30 tablet 0 Not Taking at Unknown time    Musculoskeletal: Strength & Muscle Tone: within normal limits Gait & Station: normal Patient leans: N/A  Psychiatric Specialty Exam: Physical Exam  Constitutional: He is oriented to person, place, and time. He appears well-developed and well-nourished.  HENT:  Head: Normocephalic and atraumatic.  Eyes: Conjunctivae and EOM are normal.  Neck: Normal range of motion.  Respiratory: Effort normal.  Musculoskeletal: Normal range of motion.  Neurological: He is alert and oriented to person, place, and time.    Review of Systems  Constitutional: Negative.   HENT: Negative.   Eyes: Negative.   Respiratory: Negative.   Cardiovascular: Negative.   Gastrointestinal: Negative.   Genitourinary: Negative.   Musculoskeletal: Negative.   Skin: Negative.   Neurological: Negative.   Endo/Heme/Allergies: Negative.   Psychiatric/Behavioral: Negative.     Blood pressure 138/86, pulse 65, temperature 98 F (36.7 C), temperature source Oral, resp. rate 18, height 6' 1.62" (1.87 m), weight 78.472 kg (173 lb).Body mass index is 22.44  kg/(m^2).  General Appearance: Fairly Groomed  Eye Contact:  Good  Speech:  Clear and Coherent  Volume:  Normal  Mood:  Euthymic  Affect:  Appropriate  Thought Process:  Linear and Descriptions of Associations: Intact  Orientation:  Full (Time, Place, and Person)  Thought Content:  Paranoid Ideation  Suicidal Thoughts:  No  Homicidal Thoughts:  No  Memory:  Immediate;   Fair Recent;   Good Remote;   Good  Judgement:  Impaired  Insight:  Shallow  Psychomotor Activity:  Normal  Concentration:  Concentration: Fair and Attention Span: Fair  Recall:  [employer] of Knowledge:  Fair  Language:  Good  Akathisia:  No  Handed:    AIMS (if indicated):     Assets:  Armed forces logistics/support/administrative officer Physical Health Social Support  ADL's:  Intact  Cognition:  WNL  Sleep:  Number of Hours: 6.25    Treatment Plan Summary:  Schizoaffective disorder the patient will be started on Invega 9 mg by mouth daily at bedtime. I will offer Invega sustained upon discharge.  Patient has a history of being noncompliant with follow-up or with medications  Insomnia for insomnia I will order Ativan 1  mg by mouth daily at bedtime  Tobacco use disorder I will order a nicotine patch 21 mg a day  Cannabis use disorder: Patient will be set up with psychiatric and substance abuse treatment upon discharge  Precautions every 15 minute checks  Diet regular  Hospitalization and status continue involuntary commitment  Laboratory I will order hemoglobin A1c, lipid panel and prolactin level   I certify that inpatient services furnished can reasonably be expected to improve the patient's condition.    Hildred Priest, MD 5/31/20173:19 PM

## 2016-05-15 NOTE — Tx Team (Addendum)
Initial Interdisciplinary Treatment Plan   PATIENT STRESSORS: Financial difficulties Medication change or noncompliance   PATIENT STRENGTHS: General fund of knowledge Supportive family/friends   PROBLEM LIST: Problem List/Patient Goals Date to be addressed Date deferred Reason deferred Estimated date of resolution  Patient stated, "stated that he is going to kill himself with a knife and doesn't care if her hurts people around him."      Psychosis       Suicidal ideations                                            DISCHARGE CRITERIA:  Improved stabilization in mood, thinking, and/or behavior  PRELIMINARY DISCHARGE PLAN: Outpatient therapy  PATIENT/FAMIILY INVOLVEMENT: This treatment plan has been presented to and reviewed with the patient, Austin Gutierrez, and/or family member.  The patient and family have been given the opportunity to ask questions and make suggestions.  [name] 05/15/2016, 1:28 AM

## 2016-05-15 NOTE — Progress Notes (Signed)
Pt presents pleasant, calm and cooperative. Denies SI, HI, AVH. No negative behaviors. Pt med and group compliant. Pt does not understand why he was brought here. Pt states need to get out to take care of business. Worried about where car is. Interacts appropriately with staff and peers. Encouragement and support offered. Pt receptive and remains safe on unit.

## 2016-05-15 NOTE — Progress Notes (Signed)
Patient ID: Austin Gutierrez, male   DOB: [birth year], 35 y.o.   MRN: [id number] [facility] IVC'd by mother but believes he's here because of his cousin. Agitated during admission. Stated he doesn't need to be here and that he has a kid to watch after who's around a drug dealer. He denies SI/HI/AVH. States he just has problems with aggression at times. Skin search done with [facility] MHT. Vital signs taken. No contraband found. Patient appears disorganized. He's been cooperative. Compliant with medication. Oriented to unit. Safety maintained with 15 min checks.
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
## 2016-05-15 NOTE — [facility] Group Notes (Signed)
[facility] LCSW Group Therapy  05/15/2016 3:11 PM  Type of Therapy:  Group Therapy  Participation Level:  Minimal  Participation Quality:  Attentive  Affect:  Appropriate  Cognitive:  Alert  Insight:  Limited  Engagement in Therapy:  Limited  Modes of Intervention:  Discussion, Education, Socialization and Support  Summary of Progress/Problems:Emotional Regulation: Patients will identify both negative and positive emotions. They will discuss emotions they have difficulty regulating and how they impact their lives. Patients will be asked to identify healthy coping skills to combat unhealthy reactions to negative emotions.   Pt attended group and stayed the entire time. He was supportive of other group members. He discussed his support system.  Jalynne Persico L Samanvitha Germany MSW, LCSWA  05/15/2016, 3:11 PM

## 2016-05-15 NOTE — Progress Notes (Signed)
Recreation Therapy Notes  Date: 05.31.17 Time: 1:00 pm Location: Craft Room  Group Topic: Self-esteem  Goal Area(s) Addresses:  Patient will identify positive traits about self. Patient will verbalize benefit of having healthy self-esteem.  Behavioral Response: Attentive, Interactive  Intervention: I Am  Activity: Patients were given a worksheet with the letter I on it and instructed to write as many positive traits inside the letter.  Education: LRT educated patients on ways to increase their self-esteem.  Education Outcome: Acknowledges education/In group clarification offered  Clinical Observations/Feedback: Patient completed activity by writing positive traits. Patient contributed to group discussion by stating it was easy to think of positive traits and why, how it felt to write positive traits, how to increase his self-esteem, and how his self-esteem affects him.  Jacquelynn CreeGreene,Bolivar Koranda M, LRT/CTRS 05/15/2016 3:42 PM

## 2016-05-16 ENCOUNTER — Encounter: Payer: Self-pay | Admitting: Psychiatry

## 2016-05-16 LAB — LIPID PANEL
CHOL/HDL RATIO: 2.3 ratio
Cholesterol: 100 mg/dL (ref 0–200)
HDL: 44 mg/dL (ref >40)
LDL Cholesterol: 47 mg/dL (ref 0–99)
TRIGLYCERIDES: 44 mg/dL (ref <150)
VLDL: 9 mg/dL (ref 0–40)

## 2016-05-16 LAB — HEMOGLOBIN A1C: Hgb A1c MFr Bld: 5.5 % (ref 4.0–6.0)

## 2016-05-16 LAB — TSH: TSH: 1.409 u[IU]/mL (ref 0.350–4.500)

## 2016-05-16 MED ORDER — DIVALPROEX SODIUM 500 MG PO DR TAB
500.0000 mg | DELAYED_RELEASE_TABLET | Freq: Two times a day (BID) | ORAL | Status: DC
  Administered 2016-05-16 – 2016-05-21 (×11): 500 mg via ORAL
  Filled 2016-05-16 (×13): qty 1

## 2016-05-16 MED ORDER — HALOPERIDOL 5 MG PO TABS
10.0000 mg | ORAL_TABLET | Freq: Every day | ORAL | Status: DC
  Administered 2016-05-16 – 2016-05-20 (×5): 10 mg via ORAL
  Filled 2016-05-16 (×5): qty 2

## 2016-05-16 MED ORDER — DIVALPROEX SODIUM 500 MG PO DR TAB: 500.0000 mg | DELAYED_RELEASE_TABLET | Freq: Three times a day (TID) | ORAL | Status: DC

## 2016-05-16 MED ORDER — TRAZODONE HCL 100 MG PO TABS
100.0000 mg | ORAL_TABLET | Freq: Every day | ORAL | Status: DC
  Filled 2016-05-16: qty 1

## 2016-05-16 MED ORDER — HALOPERIDOL DECANOATE 100 MG/ML IM SOLN
100.0000 mg | INTRAMUSCULAR | Status: DC
  Administered 2016-05-16: 100 mg via INTRAMUSCULAR
  Filled 2016-05-16: qty 1

## 2016-05-16 NOTE — Plan of Care (Signed)
Problem: [facility] Participation in Recreation Therapeutic Interventions Goal: STG-Patient will demonstrate improved self esteem by identif STG: Self-Esteem - Within 4 treatment sessions, patient will verbalize at least 5 positive affirmation statements in each of 2 treatment sessions to increase self-esteem post d/c.  Outcome: Progressing Treatment Session 1; Completed 1 out of 2: At approximately 11:35 am, LRT met with patient in patient room. Patient verbalized 5 positive affirmation statements. Patient reported it felt "really good". LRT encouraged patient to continue saying positive affirmation statements.  Leonette Monarch, LRT/CTRS 06.01.17 12:08 pm Goal: STG-Patient will identify at least five coping skills for ** STG: Coping Skills - Within 4 treatment sessions, patient will verbalize at least 5 coping skills for anger in each of 2 treatment sessions to increase anger management skills post d/c.  Outcome: Progressing Treatment Session 1; Completed 1 out of 2: At approximately 11:35 am, LRT met with patient in patient room. Patient verbalized 5 coping skills for anger. Patient verbalized what triggers him to get angry, how his body responds to anger, and how he is going to remember to use his healthy coping skills. LRT provided suggestions as well.  Leonette Monarch, LRT/CTRS 06.01.17 12:10 pm

## 2016-05-16 NOTE — [facility] Group Notes (Signed)
[facility] Group Notes:  (Nursing/MHT/Case Management/Adjunct)  Date:  05/16/2016  Time:  1:50 AM  Type of Therapy:  Group Therapy  Participation Level:  Minimal    Summary of Progress/Problems:Pt left early.   Fanny Skatesshley Imani Pahoua Schreiner 05/16/2016, 1:50 AM

## 2016-05-16 NOTE — Progress Notes (Signed)
[facility]  05/16/2016 11:54 AM Zollie BeckersDaetwann Mexicano  MRN:  [id number]  Subjective:  [name] denies any symptoms of depression, anxiety, or psychosis. He is not suicidal or homicidal. He seems not to understand why his mother petition him. He does admit that he has not been taking medications and that Haldol and Depakote were helpful in the past for psychosis and mood swings. He was started on [place] but refuses to take it. He denies any somatic complaints. His sleep was interrupted last night.  Principal Problem: Schizoaffective disorder, bipolar type (HCC) Diagnosis:   Patient Active Problem List   Diagnosis Date Noted  . Tobacco use disorder [F17.200] 05/15/2016  . Schizoaffective disorder, bipolar type (HCC) [F25.0] 05/15/2016   Total Time spent with patient: 20 minutes  Past Psychiatric History: Schizophrenia.  Past Medical History:  Past Medical History  Diagnosis Date  . Schizoaffective disorder (HCC)    History reviewed. No pertinent past surgical history. Family History:  Family History  Problem Relation Age of Onset  . Depression Other    Family Psychiatric  History: See H&P. Social History:  History  Alcohol Use No    Comment: denies regular alcohol use     History  Drug Use  . Yes  . Special: Marijuana    Social History   Social History  . Marital Status: Single    Spouse Name: N/A  . Number of Children: N/A  . Years of Education: N/A   Social History Main Topics  . Smoking status: Heavy Tobacco Smoker -- 0.50 packs/day    Types: Cigarettes  . Smokeless tobacco: None  . Alcohol Use: No     Comment: denies regular alcohol use  . Drug Use: Yes    Special: Marijuana  . Sexual Activity: Yes   Other Topics Concern  . None   Social History Narrative   Additional Social History:                         Sleep: Poor  Appetite:  Fair  Current Medications: Current Facility-Administered Medications  Medication Dose Route Frequency  Provider Last Rate Last Dose  . alum & mag hydroxide-simeth (MAALOX/MYLANTA) 200-200-20 MG/5ML suspension 30 mL  30 mL Oral Q4H PRN Charm RingsJamison Y Lord, NP      . divalproex (DEPAKOTE) DR tablet 500 mg  500 mg Oral Q12H Lonni Dirden B Karrington Mccravy, MD      . haloperidol (HALDOL) tablet 10 mg  10 mg Oral QHS Vinay Ertl B Orland Visconti, MD      . haloperidol decanoate (HALDOL DECANOATE) 100 MG/ML injection 100 mg  100 mg Intramuscular Q30 days Etana Beets B Elba Schaber, MD      . magnesium hydroxide (MILK OF MAGNESIA) suspension 30 mL  30 mL Oral Daily PRN Charm RingsJamison Y Lord, NP      . nicotine (NICODERM CQ - dosed in mg/24 hours) patch 21 mg  21 mg Transdermal Daily Jimmy FootmanAndrea Hernandez-Gonzalez, MD   21 mg at 05/15/16 1530  . traZODone (DESYREL) tablet 100 mg  100 mg Oral QHS Shari ProwsJolanta B Tykeem Lanzer, MD        Lab Results:  Results for orders placed or performed during the hospital encounter of 05/14/16 (from the past 48 hour(s))  Lipid panel     Status: None   Collection Time: 05/16/16  7:50 AM  Result Value Ref Range   Cholesterol 100 0 - 200 mg/dL   Triglycerides 44 <409<150 mg/dL   HDL 44 >81>40  mg/dL   Total CHOL/HDL Ratio 2.3 RATIO   VLDL 9 0 - 40 mg/dL   LDL Cholesterol 47 0 - 99 mg/dL    Comment:        Total Cholesterol/HDL:CHD Risk Coronary Heart Disease Risk Table                     Men   Women  1/2 Average Risk   3.4   3.3  Average Risk       5.0   4.4  2 X Average Risk   9.6   7.1  3 X Average Risk  23.4   11.0        Use the calculated Patient Ratio above and the CHD Risk Table to determine the patient's CHD Risk.        ATP III CLASSIFICATION (LDL):  <100     mg/dL   Optimal  161-096  mg/dL   Near or Above                    Optimal  130-159  mg/dL   Borderline  045-409  mg/dL   High  >811     mg/dL   Very High   TSH     Status: None   Collection Time: 05/16/16  7:50 AM  Result Value Ref Range   TSH 1.409 0.350 - 4.500 uIU/mL    Blood Alcohol level:  Lab Results  Component Value Date   ETH  <5 05/13/2016   ETH <5 07/27/2015    Physical Findings: AIMS:  , ,  ,  , Dental Status Current problems with teeth and/or dentures?: No Does patient usually wear dentures?: No  CIWA:    COWS:     Musculoskeletal: Strength & Muscle Tone: within normal limits Gait & Station: normal Patient leans: N/A  Psychiatric Specialty Exam: Physical Exam  Nursing note and vitals reviewed.   Review of Systems  All other systems reviewed and are negative.   Blood pressure 127/84, pulse 65, temperature 98.1 F (36.7 C), temperature source Oral, resp. rate 18, height 6' 1.62" (1.87 m), weight 78.472 kg (173 lb).Body mass index is 22.44 kg/(m^2).  General Appearance: Fairly Groomed  Eye Contact:  Good  Speech:  Clear and Coherent  Volume:  Normal  Mood:  Anxious  Affect:  Blunt  Thought Process:  Goal Directed  Orientation:  Full (Time, Place, and Person)  Thought Content:  WDL  Suicidal Thoughts:  No  Homicidal Thoughts:  No  Memory:  Immediate;   Fair Recent;   Fair Remote;   Fair  Judgement:  Poor  Insight:  Lacking  Psychomotor Activity:  Normal  Concentration:  Concentration: Fair and Attention Span: Fair  Recall:  [employer] of Knowledge:  Fair  Language:  Fair  Akathisia:  No  Handed:  Right  AIMS (if indicated):     Assets:  Manufacturing systems engineer Physical Health Resilience Social Support  ADL's:  Intact  Cognition:  WNL  Sleep:  Number of Hours: 5.5     Treatment Plan Summary: Daily contact with patient to assess and evaluate symptoms and progress in treatment and Medication management   Mr. Heidelberger has a history of schizoaffective disorder and substance use. He was admitted for worsening of psychosis in the context of treatment noncompliance and substance. \  1. Mood and psychosis. The patient was started on Invega but prefers Haldol for psychosis. He also demands Depakote to stabilize the mood.  We will offer her Haldol decanoate injection.  2. Insomnia. We will  discontinue Ativan and start trazodone.  3. Smoking. Nicotine patch is available.   4. Cannabis use disorder. The patient minimizes problems and declines treatment.  5. Metabolic syndrome screening.Lipid panel and TSH are normal. Hemoglobin A1c and prolactin are pending.   6. Disposition. The patient will be discharged to home with his cousin. He will follow up with [facility] in [place].  Kristine Linea, MD 05/16/2016, 11:54 AM

## 2016-05-16 NOTE — Tx Team (Signed)
Interdisciplinary Treatment Plan Update (Adult)  Date:  05/16/2016 Time Reviewed:  1:32 PM  Progress in Treatment: Attending groups: Yes. Participating in groups:  Yes. Taking medication as prescribed:  Yes. Tolerating medication:  Yes. Family/Significant othe contact made:  No, will contact:  mother  Patient understands diagnosis:  No. and As evidenced by:  limited insight  Discussing patient identified problems/goals with staff:  Yes. Medical problems stabilized or resolved:  Yes. Denies suicidal/homicidal ideation: Yes. Issues/concerns per patient self-inventory:  Yes. Other:  New problem(s) identified: No, Describe:  NA  Discharge Plan or Barriers: Pt plans to return home and follow up with outpatient.    Reason for Continuation of Hospitalization: Hallucinations Medication stabilization  Comments:Austin Gutierrez denies any symptoms of depression, anxiety, or psychosis. He is not suicidal or homicidal. He seems not to understand why his mother petition him. He does admit that he has not been taking medications and that Haldol and Depakote were helpful in the past for psychosis and mood swings. He was started on Austin Gutierrez but refuses to take it. He denies any somatic complaints. His sleep was interrupted last night.  Estimated length of stay: 7 days   New goal(s): NA  Review of initial/current patient goals per problem list:   1.  Goal(s): Patient will participate in aftercare plan * Met:  * Target date: at discharge * As evidenced by: Patient will participate within aftercare plan AEB aftercare provider and housing plan at discharge being identified.  2.  Goal (s): Patient will demonstrate decreased symptoms of psychosis. * Met: No  *  Target date: at discharge * As evidenced by: Patient will not endorse signs of psychosis or be deemed stable for discharge by MD.   Attendees: Patient:  Austin Gutierrez 6/1/20171:32 PM  Family:   6/1/20171:32 PM  Physician:  Dr. Bary Leriche   6/1/20171:32 PM  Nursing: Junita Push, RN    6/1/20171:32 PM  Case Manager:   6/1/20171:32 PM  Counselor:   6/1/20171:32 PM  Other:  Wray Kearns, [place] 6/1/20171:32 PM  Other:  Everitt Amber, LRT  6/1/20171:32 PM  Other:   6/1/20171:32 PM  Other:  6/1/20171:32 PM  Other:  6/1/20171:32 PM  Other:  6/1/20171:32 PM  Other:  6/1/20171:32 PM  Other:  6/1/20171:32 PM  Other:  6/1/20171:32 PM  Other:   6/1/20171:32 PM   Scribe for Treatment Team:   Liliya Fullenwider L Mandi Mattioli,MSW, [place]  05/16/2016, 1:32 PM

## 2016-05-16 NOTE — Progress Notes (Signed)
Recreation Therapy Notes  Date: 06.01.17 Time: 1:00 pm Location: Craft Room  Group Topic: Leisure Education  Goal Area(s) Addresses:  Patient will identify activities for each letter of the alphabet. Patient will verbalize ability to integrate positive leisure into life post d/c. Patient will verbalize ability to use leisure as a Associate Professorcoping skill.  Behavioral Response: Attentive, Interactive  Intervention: Leisure Alphabet  Activity: Patients were given a Leisure Alphabet and instructed to write a healthy leisure activity for each letter of the alphabet.  Education: LRT educated patients on what they need to participate in leisure.  Education Outcome: In group clarification offered   Clinical Observations/Feedback: Patient completed activity by writing healthy leisure activities. Patient contributed to group discussion by stating healthy leisure activities. Patient say "girls" as a leisure activity.  Jacquelynn CreeGreene,Ela Moffat M, LRT/CTRS 05/16/2016 2:54 PM

## 2016-05-16 NOTE — Plan of Care (Signed)
Problem: Health Behavior/Discharge Planning: Goal: Compliance with prescribed medication regimen will improve Outcome: Not Progressing Pt initially refused medication, and needed very, strong encouragement to take night medications.

## 2016-05-16 NOTE — Progress Notes (Signed)
D: Observed pt in room resting. Patient alert and oriented x4. Patient denies SI/HI/AVH. Pt affect is anxious and irritable. Pt become somewhat agitated when medication regimen for the evening was introduced to pt. Pt complained "the doctor didn't tell me I was taking this." Pt expressed concerns that "I don't know what this is...nobody told me I was going to take this" and other concerns about medication safety. Pt initially refused medications. Pt denied feeling depressed or anxious. Pt appeared very suspicious and paranoid. Pt was preoccupied with his car and bills being paid. Pt was circumstantial during conversation.   A: Offered active listening and support. Provided therapeutic communication. Educated pt on medication. Encouraged pt to take night medication, and informed pt of all measures in place to ensure safe medication administration. Administered scheduled medications.  R: Pt cooperative. Pt apologized multiple times stating "I didn't mean to get upset.Marland Kitchen.Marland Kitchen.I'm a good person." Pt eventually took medication with encouragement.  Pt medication compliant. Will continue Q15 min. checks. Safety maintained.

## 2016-05-16 NOTE — Progress Notes (Addendum)
Patient denies suicidal or homicidal ideations and AV hallucinations.He does not know why he is here.Patient was little irritable & reused to take the injection this morning.Stated that he wants to get out from here because he does not need to be here.Later patient stated that he will take Haldol & Depakote that will help him.Patient is pacing in the hallway,came to staff states "my mom will be here tomorrow morning I need to get out from here."Attended groups.Compliant with medications.

## 2016-05-17 LAB — PROLACTIN: Prolactin: 32 ng/mL — ABNORMAL HIGH (ref 4.0–15.2)

## 2016-05-17 MED ORDER — TEMAZEPAM 15 MG PO CAPS
15.0000 mg | ORAL_CAPSULE | Freq: Every day | ORAL | Status: DC
  Administered 2016-05-17 – 2016-05-20 (×3): 15 mg via ORAL
  Filled 2016-05-17 (×4): qty 1

## 2016-05-17 NOTE — Progress Notes (Signed)
NUTRITION ASSESSMENT  Pt identified as at risk on the Malnutrition Screen Tool (unsure if any wt loss)  INTERVENTION: 1. Monitor intake and cater to pt preferences with diet restrictions  NUTRITION DIAGNOSIS: None at this time Goal: Pt to meet >/= 90% of their estimated nutrition needs.  Monitor:  PO intake  Assessment:    35 y.o. male admitted with psychosis secondary to treatment noncompliance  Height: Ht Readings from Last 1 Encounters:  05/14/16 6' 1.62" (1.87 m)    Weight: Wt Readings from Last 1 Encounters:  05/14/16 173 lb (78.472 kg)    Weight Hx: Wt gain noted Wt Readings from Last 10 Encounters:  05/14/16 173 lb (78.472 kg)  05/13/16 173 lb (78.472 kg)  07/28/15 157 lb (71.215 kg)  07/27/15 161 lb (73.029 kg)  08/31/14 152 lb (68.947 kg)    BMI:  Body mass index is 22.44 kg/(m^2).   Estimated Nutritional Needs: Kcal: 25-30 kcal/kg Protein: > 1 gram protein/kg Fluid: 1 ml/kcal  Diet Order: Diet Heart Room service appropriate?: Yes; Fluid consistency:: Thin Pt is also offered choice of unit snacks.  Noted pt eating mostly 90-100% of meals. Pt is eating as desired.   Lab results and medications reviewed.   Sofia Vanmeter B. Freida BusmanAllen, RD, LDN [phone] (pager) Weekend/On-Call pager [phone])

## 2016-05-17 NOTE — Plan of Care (Signed)
Problem: Medication: Goal: Compliance with prescribed medication regimen will improve Outcome: Progressing Pt more compliant with medication regimen this evening. Minimal encouragement needed.

## 2016-05-17 NOTE — Plan of Care (Signed)
Problem: Education: Goal: Ability to make informed decisions regarding treatment will improve Outcome: Not Progressing Patient refused to do the self inventory,stated "I am ok."

## 2016-05-17 NOTE — Progress Notes (Signed)
[facility] MD Progress Note  05/17/2016 5:32 AM Xeng Kucher  MRN:  [id number]  Subjective:  Mr. Antonelli is a 35 year old male with a history of schizophrenia admitted floridly psychotic in the context of treatment noncompliance.  Today Mr. Faiola is still actively hallucinating. His talking to himself. Yesterday he was arguing with information board. When asked directly, he denies any symptoms of depression, anxiety, or psychosis. Today he is secluded to his room with poor hygiene and psychomotor retardation. He seems to tolerate medications well. He did accept Haldol Decanoate injection.  Principal Problem: Schizoaffective disorder, bipolar type (HCC) Diagnosis:   Patient Active Problem List   Diagnosis Date Noted  . Tobacco use disorder [F17.200] 05/15/2016  . Schizoaffective disorder, bipolar type (HCC) [F25.0] 05/15/2016   Total Time spent with patient: 20 minutes  Past Psychiatric History: Schizophrenia.  Past Medical History:  Past Medical History  Diagnosis Date  . Schizoaffective disorder (HCC)    History reviewed. No pertinent past surgical history. Family History:  Family History  Problem Relation Age of Onset  . Depression Other    Family Psychiatric  History: None reported. Social History:  History  Alcohol Use No    Comment: denies regular alcohol use     History  Drug Use  . Yes  . Special: Marijuana    Social History   Social History  . Marital Status: Single    Spouse Name: N/A  . Number of Children: N/A  . Years of Education: N/A   Social History Main Topics  . Smoking status: Heavy Tobacco Smoker -- 0.50 packs/day    Types: Cigarettes  . Smokeless tobacco: None  . Alcohol Use: No     Comment: denies regular alcohol use  . Drug Use: Yes    Special: Marijuana  . Sexual Activity: Yes   Other Topics Concern  . None   Social History Narrative   Additional Social History:                         Sleep: Fair  Appetite:   Fair  Current Medications: Current Facility-Administered Medications  Medication Dose Route Frequency Provider Last Rate Last Dose  . alum & mag hydroxide-simeth (MAALOX/MYLANTA) 200-200-20 MG/5ML suspension 30 mL  30 mL Oral Q4H PRN Charm Rings, NP      . divalproex (DEPAKOTE) DR tablet 500 mg  500 mg Oral Q12H Adore Kithcart B Grantland Want, MD   500 mg at 05/16/16 2211  . haloperidol (HALDOL) tablet 10 mg  10 mg Oral QHS Skyelar Halliday B Coreena Rubalcava, MD   10 mg at 05/16/16 2210  . haloperidol decanoate (HALDOL DECANOATE) 100 MG/ML injection 100 mg  100 mg Intramuscular Q30 days Shari Prows, MD   100 mg at 05/16/16 1447  . magnesium hydroxide (MILK OF MAGNESIA) suspension 30 mL  30 mL Oral Daily PRN Charm Rings, NP      . nicotine (NICODERM CQ - dosed in mg/24 hours) patch 21 mg  21 mg Transdermal Daily Jimmy Footman, MD   21 mg at 05/15/16 1530  . traZODone (DESYREL) tablet 100 mg  100 mg Oral QHS Shari Prows, MD   100 mg at 05/16/16 2309    Lab Results:  Results for orders placed or performed during the hospital encounter of 05/14/16 (from the past 48 hour(s))  Lipid panel     Status: None   Collection Time: 05/16/16  7:50 AM  Result Value Ref Range   Cholesterol  100 0 - 200 mg/dL   Triglycerides 44 <161 mg/dL   HDL 44 >09 mg/dL   Total CHOL/HDL Ratio 2.3 RATIO   VLDL 9 0 - 40 mg/dL   LDL Cholesterol 47 0 - 99 mg/dL    Comment:        Total Cholesterol/HDL:CHD Risk Coronary Heart Disease Risk Table                     Men   Women  1/2 Average Risk   3.4   3.3  Average Risk       5.0   4.4  2 X Average Risk   9.6   7.1  3 X Average Risk  23.4   11.0        Use the calculated Patient Ratio above and the CHD Risk Table to determine the patient's CHD Risk.        ATP III CLASSIFICATION (LDL):  <100     mg/dL   Optimal  604-540  mg/dL   Near or Above                    Optimal  130-159  mg/dL   Borderline  981-191  mg/dL   High  >478     mg/dL   Very  High   Hemoglobin A1c     Status: None   Collection Time: 05/16/16  7:50 AM  Result Value Ref Range   Hgb A1c MFr Bld 5.5 4.0 - 6.0 %  Prolactin     Status: Abnormal   Collection Time: 05/16/16  7:50 AM  Result Value Ref Range   Prolactin 32.0 (H) 4.0 - 15.2 ng/mL    Comment: (NOTE) Performed At: [facility] [address] [place], [place] [id number] Mila Homer MD MV:[id number]   TSH     Status: None   Collection Time: 05/16/16  7:50 AM  Result Value Ref Range   TSH 1.409 0.350 - 4.500 uIU/mL    Blood Alcohol level:  Lab Results  Component Value Date   ETH <5 05/13/2016   ETH <5 07/27/2015    Physical Findings: AIMS:  , ,  ,  , Dental Status Current problems with teeth and/or dentures?: No Does patient usually wear dentures?: No  CIWA:    COWS:     Musculoskeletal: Strength & Muscle Tone: within normal limits Gait & Station: normal Patient leans: N/A  Psychiatric Specialty Exam: Physical Exam  Nursing note and vitals reviewed.   Review of Systems  Psychiatric/Behavioral: Positive for hallucinations.  All other systems reviewed and are negative.   Blood pressure 127/84, pulse 65, temperature 98.1 F (36.7 C), temperature source Oral, resp. rate 18, height 6' 1.62" (1.87 m), weight 78.472 kg (173 lb).Body mass index is 22.44 kg/(m^2).  General Appearance: Disheveled  Eye Contact:  Minimal  Speech:  Slow  Volume:  Decreased  Mood:  Anxious  Affect:  Flat  Thought Process:  Disorganized  Orientation:  Full (Time, Place, and Person)  Thought Content:  Delusions, Hallucinations: Auditory and Paranoid Ideation  Suicidal Thoughts:  No  Homicidal Thoughts:  No  Memory:  Immediate;   Fair Recent;   Fair Remote;   Fair  Judgement:  Poor  Insight:  Lacking  Psychomotor Activity:  Decreased  Concentration:  Concentration: Fair and Attention Span: Fair  Recall:  [employer] of Knowledge:  Fair  Language:  Fair  Akathisia:  No  Handed:  Right  AIMS (if indicated):     Assets:  Physical Health Resilience  ADL's:  Intact  Cognition:  WNL  Sleep:  Number of Hours: 5.5     Treatment Plan Summary: Daily contact with patient to assess and evaluate symptoms and progress in treatment and Medication management   [name] has a history of schizoaffective disorder and substance use. He was admitted for worsening of psychosis in the context of treatment noncompliance and substance. \  1. Mood and psychosis. The patient was started on Invega but prefers Haldol for psychosis. He requested Depakote to stabilize the mood. We offered Haldol decanoate injection on 05/17/2016.   2. Insomnia. He did not respond to Ativan or trazodone. We will start Restoril.   3. Smoking. Nicotine patch is available.   4. Cannabis use disorder. The patient minimizes problems and declines treatment.  5. Metabolic syndrome screening.Lipid panel, TSH and Hemoglobin A1care normal. Prolactin 32.    6. Disposition. The patient will be discharged to home with his cousin. [facility] [place].  Kristine LineaJolanta Fizza Scales, MD 05/17/2016, 5:32 AM

## 2016-05-17 NOTE — Progress Notes (Signed)
Recreation Therapy Notes  Date: 06.02.17 Time: 9:30 am Location: Craft Room  Group Topic: Coping Skills  Goal Area(s) Addresses:  Patient will participate in healthy coping skill. Patient will verbalize benefit of using art as a coping skill.  Behavioral Response: Did not attend  Intervention: Coloring  Activity: Patients were instructed to color and to think about the emotions they were feeling as well as what they were focused on while they were coloring.  Education: LRT educated patients on healthy coping skills.  Education Outcome: Patient did not attend group.  Clinical Observations/Feedback: Patient did not attend group.  Jacquelynn CreeGreene,Peng Thorstenson M, LRT/CTRS 05/17/2016 10:22 AM

## 2016-05-17 NOTE — [facility] Group Notes (Signed)
[facility] Group Notes:  (Nursing/MHT/Case Management/Adjunct)  Date:  05/17/2016  Time:  4:15 PM  Type of Therapy:  Group Therapy  Participation Level:  Minimal  Participation Quality:  Attentive  Affect:  Flat  Cognitive:  Alert  Insight:  Limited  Engagement in Group:  Engaged  Modes of Intervention:  Activity  Summary of Progress/Problems:  Austin Gutierrez 05/17/2016, 4:15 PM

## 2016-05-17 NOTE — Progress Notes (Signed)
D: Observed pt in dayroom. Patient alert and oriented x4. Patient denies SI/HI/AVH. Pt affect is anxious. Pt still very preoccupied with medication and stated multiple times "took medication that I shouldn't be on." Pt also mentioned repeatedly how he should be discharged tomorrow. Pt concerned about the whereabouts of his vehicle, despite mother and cousin telling him where it was. Pt rated depression 0/10 and anxiety 0/10. A: Offered active listening and support. Provided therapeutic communication. Administered scheduled medications.  R: Pt cooperative. Pt required much less encouragement this evening for medication. Pt only refused trazodone stating "I sleep fine, I don't need that." Will continue Q15 min. checks. Safety maintained.

## 2016-05-17 NOTE — Plan of Care (Signed)
Problem: [facility] Participation in Recreation Therapeutic Interventions Goal: STG-Patient will demonstrate improved self esteem by identif STG: Self-Esteem - Within 4 treatment sessions, patient will verbalize at least 5 positive affirmation statements in each of 2 treatment sessions to increase self-esteem post d/c.  Outcome: Completed/Met Date Met:  05/17/16 Treatment Session 2; Completed 2 out of 2: At approximately 11:20 am, LRT met with patient in patient room. Patient verbalized 5 positive affirmation statements. Patient reported it felt "really good". LRT encouraged patient to continue saying positive affirmation statements.  Leonette Monarch, LRT/CTRS 06.02.17 3:12 pm Goal: STG-Patient will identify at least five coping skills for ** STG: Coping Skills - Within 4 treatment sessions, patient will verbalize at least 5 coping skills for anger in each of 2 treatment sessions to increase anger management skills post d/c.  Outcome: Completed/Met Date Met:  05/17/16 Treatment Session 2; Completed 2 out of 2: At approximately 11:20 am, LRT met with patient in patient room. Patient verbalized 5 coping skills for anger. LRT encouraged patient to use coping skills when he felt himself getting anger to help calm himself down.  Leonette Monarch, LRT/CTRS 06.02.17 3:15 pm

## 2016-05-17 NOTE — [facility] Group Notes (Signed)
[facility] LCSW Group Therapy  05/17/2016 1:55 PM  Type of Therapy:  Group Therapy  Participation Level:  Minimal  Participation Quality:  Attentive  Affect:  Flat  Cognitive:  Alert  Insight:  Limited  Engagement in Therapy:  Limited  Modes of Intervention:  Discussion, Education, Socialization and Support  Summary of Progress/Problems: Feelings around Relapse. Group members discussed the meaning of relapse and shared personal stories of relapse, how it affected them and others, and how they perceived themselves during this time. Group members were encouraged to identify triggers, warning signs and coping skills used when facing the possibility of relapse. Social supports were discussed and explored in detail. Pt attended group and stayed the entire time. He sat quietly and listened to other group members share.   Juno Bozard L Keamber Macfadden MSW, LCSWA  05/17/2016, 1:55 PM

## 2016-05-17 NOTE — Progress Notes (Signed)
2030: Patient in bed awake. Guarded and disorganized with poor insight. Avoiding details in conversations, brief with poor eye contact but reporting that "I am feeling great, I am ready to go home...". Laughing inappropriately. Was encouraged to talk to staff as needed. Safety precautions reinforced.  2300: patient received his medications and returned to his room. Currently  Sleeping. No sign of distress.safety precautions  Maintained.

## 2016-05-17 NOTE — [facility] Group Notes (Signed)
[facility] LCSW Group Therapy   05/17/2016 9:30 am  Type of Therapy: Group Therapy   Participation Level: Did Not Attend. Patient invited to participate but declined.    Aamna Mallozzi F. Alveta Quintela, MSW, LCSWA, LCAS   

## 2016-05-17 NOTE — Progress Notes (Signed)
Patient was preoccupied with discharge since this morning.Refused to take his medicine,states "I don't need any medicines."Denies depression,anxiety,suicidal ideations and AV hallucinations.Patient was talking to himself in his room. After much encouragement patient took his medicine.Pacing in the unit.No interactions with peers.Attended some groups.

## 2016-05-18 MED ORDER — QUETIAPINE FUMARATE 25 MG PO TABS: 50.0000 mg | ORAL_TABLET | ORAL | Status: DC | PRN

## 2016-05-18 NOTE — Plan of Care (Signed)
Problem: Safety: Goal: Ability to redirect hostility and anger into socially appropriate behaviors will improve Outcome: Progressing Patient is redirectable but continues to experience poor insight

## 2016-05-18 NOTE — [facility] Group Notes (Signed)
[facility] Group Notes:  (Nursing/MHT/Case Management/Adjunct)  Date:  05/18/2016  Time:  9:27 AM  Type of Therapy:  Goal setting   Participation Level:  Did Not Attend  Austin Gutierrez 05/18/2016, 9:27 AM

## 2016-05-18 NOTE — Plan of Care (Signed)
Problem: Coping: Goal: Ability to verbalize feelings will improve Outcome: Not Progressing Pt is guarded upon approach. He forwards little with assessment.  Problem: Safety: Goal: Ability to remain free from injury will improve Outcome: Progressing Pt remains free from harm.

## 2016-05-18 NOTE — Progress Notes (Addendum)
Patient with blunted affect, cooperative behavior with meals. Attends outside therapy group. Suspicious rt medicines. Asks to see med package and takes medicine after reading name of med on package. Completes daily goal sheet, stating concerns about everyone wants me to in the hospital and take med. Patient refuses to sign daily goal sheet. Safety maintained. Patient confused and preoccupied and not oriented to situation. Patient aware he is involuntary commit but does not belief he should be in the hospital for med non compliance. Safety maintained. Patient with increasing anxiety and agitation. Pacing unit, comes to nurse's station and states he wants his belongings and he wants to discharge. MD notified and orders Seroquel 50 mg po prn Q 4 hours if needed. One on one with patient, with Clinical research associatewriter and male nurse with good effect. Patient noted to get upset after speaking with Mother on phone. Encouraged patient to take a break from speaking on phone with Mother rt involuntary commission. Patient thinks this is a good idea and states I'm okay, I'm okay. Safety maintained.

## 2016-05-18 NOTE — Progress Notes (Signed)
[facility]  05/18/2016 6:26 PM Austin BeckersDaetwann Gutierrez  MRN:  [id number]  Subjective:  Mr. Austin MaskerMorris is a 35 year old male with a history of schizophrenia admitted floridly psychotic in the context of treatment noncompliance.  Patient interviewed chart reviewed. 35 year old man with schizophrenia. Patient had no complaints to me today except that he said his arm was very sore. He was grabbing onto his arm and says that he thinks he might of had a shot there. He denies having any hallucinations. Does not show evidence of having any insight into his condition. Not threatening. Stays mostly to himself. Shirt and vitals normal  Principal Problem: Schizoaffective disorder, bipolar type (HCC) Diagnosis:   Patient Active Problem List   Diagnosis Date Noted  . Tobacco use disorder [F17.200] 05/15/2016  . Schizoaffective disorder, bipolar type (HCC) [F25.0] 05/15/2016   Total Time spent with patient: 20 minutes  Past Psychiatric History: Schizophrenia.  Past Medical History:  Past Medical History  Diagnosis Date  . Schizoaffective disorder (HCC)    History reviewed. No pertinent past surgical history. Family History:  Family History  Problem Relation Age of Onset  . Depression Other    Family Psychiatric  History: None reported. Social History:  History  Alcohol Use No    Comment: denies regular alcohol use     History  Drug Use  . Yes  . Special: Marijuana    Social History   Social History  . Marital Status: Single    Spouse Name: N/A  . Number of Children: N/A  . Years of Education: N/A   Social History Main Topics  . Smoking status: Heavy Tobacco Smoker -- 0.50 packs/day    Types: Cigarettes  . Smokeless tobacco: None  . Alcohol Use: No     Comment: denies regular alcohol use  . Drug Use: Yes    Special: Marijuana  . Sexual Activity: Yes   Other Topics Concern  . None   Social History Narrative   Additional Social History:                          Sleep: Fair  Appetite:  Fair  Current Medications: Current Facility-Administered Medications  Medication Dose Route Frequency Provider Last Rate Last Dose  . alum & mag hydroxide-simeth (MAALOX/MYLANTA) 200-200-20 MG/5ML suspension 30 mL  30 mL Oral Q4H PRN Charm RingsJamison Y Lord, NP      . divalproex (DEPAKOTE) DR tablet 500 mg  500 mg Oral Q12H Jolanta B Pucilowska, MD   500 mg at 05/18/16 0950  . haloperidol (HALDOL) tablet 10 mg  10 mg Oral QHS Jolanta B Pucilowska, MD   10 mg at 05/17/16 2206  . haloperidol decanoate (HALDOL DECANOATE) 100 MG/ML injection 100 mg  100 mg Intramuscular Q30 days Shari ProwsJolanta B Pucilowska, MD   100 mg at 05/16/16 1447  . magnesium hydroxide (MILK OF MAGNESIA) suspension 30 mL  30 mL Oral Daily PRN Charm RingsJamison Y Lord, NP      . nicotine (NICODERM CQ - dosed in mg/24 hours) patch 21 mg  21 mg Transdermal Daily Jimmy FootmanAndrea Hernandez-Gonzalez, MD   21 mg at 05/15/16 1530  . QUEtiapine (SEROQUEL) tablet 50 mg  50 mg Oral Q4H PRN Audery AmelJohn T Amritha Yorke, MD      . temazepam (RESTORIL) capsule 15 mg  15 mg Oral QHS Shari ProwsJolanta B Pucilowska, MD   15 mg at 05/17/16 2206    Lab Results:  No results found for this or any previous  visit (from the past 48 hour(s)).  Blood Alcohol level:  Lab Results  Component Value Date   ETH <5 05/13/2016   ETH <5 07/27/2015    Physical Findings: AIMS:  , ,  ,  , Dental Status Current problems with teeth and/or dentures?: No Does patient usually wear dentures?: No  CIWA:    COWS:     Musculoskeletal: Strength & Muscle Tone: within normal limits Gait & Station: normal Patient leans: N/A  Psychiatric Specialty Exam: Physical Exam  Nursing note and vitals reviewed. Constitutional: He appears well-developed and well-nourished.  HENT:  Head: Normocephalic and atraumatic.  Eyes: Conjunctivae are normal. Pupils are equal, round, and reactive to light.  Neck: Normal range of motion.  Cardiovascular: Normal heart sounds.   Respiratory: Effort normal.   GI: Soft.  Musculoskeletal: Normal range of motion.  Neurological: He is alert.  Skin: Skin is warm and dry.  Psychiatric: His mood appears anxious. His affect is blunt. His speech is delayed. He is agitated. Thought content is paranoid.    Review of Systems  Psychiatric/Behavioral: Positive for hallucinations.  All other systems reviewed and are negative.   Blood pressure 115/75, pulse 63, temperature 98.2 F (36.8 C), temperature source Oral, resp. rate 18, height 6' 1.62" (1.87 m), weight 78.472 kg (173 lb).Body mass index is 22.44 kg/(m^2).  General Appearance: Disheveled  Eye Contact:  Minimal  Speech:  Slow  Volume:  Decreased  Mood:  Anxious  Affect:  Flat  Thought Process:  Disorganized  Orientation:  Full (Time, Place, and Person)  Thought Content:  Delusions, Hallucinations: Auditory and Paranoid Ideation  Suicidal Thoughts:  No  Homicidal Thoughts:  No  Memory:  Immediate;   Fair Recent;   Fair Remote;   Fair  Judgement:  Poor  Insight:  Lacking  Psychomotor Activity:  Decreased  Concentration:  Concentration: Fair and Attention Span: Fair  Recall:  [employer] of Knowledge:  Fair  Language:  Fair  Akathisia:  No  Handed:  Right  AIMS (if indicated):     Assets:  Physical Health Resilience  ADL's:  Intact  Cognition:  WNL  Sleep:  Number of Hours: 7.5     Treatment Plan Summary: Daily contact with patient to assess and evaluate symptoms and progress in treatment and Medication management   Austin Gutierrez has a history of schizoaffective disorder and substance use. He was admitted for worsening of psychosis in the context of treatment noncompliance and substance. \  1. Mood and psychosis. The patient was started on Invega but prefers Haldol for psychosis. He requested Depakote to stabilize the mood. We offered Haldol decanoate injection on 05/17/2016.   2. Insomnia. He did not respond to Ativan or trazodone. We will start Restoril.   3. Smoking. Nicotine  patch is available.   4. Cannabis use disorder. The patient minimizes problems and declines treatment.  5. Metabolic syndrome screening.Lipid panel, TSH and Hemoglobin A1care normal. Prolactin 32.    6. Disposition. The patient will be discharged to home with his cousin. He will follow up with [facility] in [place]. Nursing note is that he was agitated this evening. Order was given for him to be able to get some when necessary Seroquel for anxiety and agitation. He was getting very argumentative with nursing. No other change to treatment plan for now. Continues to be paranoid psychotic with poor insight. Mordecai Rasmussen, MD 05/18/2016, 6:26 PM

## 2016-05-18 NOTE — Plan of Care (Signed)
Problem: Education: Goal: Will be free of psychotic symptoms Outcome: Not Progressing Patient remains suspicious of meds, states "everyone wants me to be in hospital and take meds". Takes am med after seeing package.

## 2016-05-18 NOTE — [facility] Group Notes (Signed)
[facility] LCSW Group Therapy  05/18/2016 1:37 PM  Type of Therapy:  Group Therapy  Participation Level:  Did Not Attend  Modes of Intervention:  Discussion, Education, Socialization and Support  Summary of Progress/Problems: Pt will identify unhealthy thoughts and how they impact their emotions and behavior. Pt will be encouraged to discuss these thoughts, emotions and behaviors with the group.   Austin Gutierrez L Austin Gutierrez MSW, LCSWA  05/18/2016, 1:37 PM   

## 2016-05-19 NOTE — [facility] Group Notes (Signed)
[facility] LCSW Group Therapy  05/19/2016 3:15 PM  Type of Therapy:  Group Therapy  Participation Level:  Did Not Attend  Modes of Intervention:  Discussion, Education, Socialization and Support  Summary of Progress/Problems: Emotional Regulation: Patients will identify both negative and positive emotions. They will discuss emotions they have difficulty regulating and how they impact their lives. Patients will be asked to identify healthy coping skills to combat unhealthy reactions to negative emotions.     Aradia Estey L Elvin Banker MSW, LCSWA  05/19/2016, 3:15 PM    

## 2016-05-19 NOTE — Progress Notes (Signed)
Patient with blunted affect. Patient is alert and oriented x 3. Patient is resistive to plan of care. Takes medicines but will state "this is ridiculous, I don't belong here". Patient is angry his mother involuntarily committed him. No SI/HI at this time. Minimal interaction with peers. Patient is paranoid about paperwork. Will state paperwork is not right, admission process was not done correctly. Patient encouraged to take a break from speaking with Mom on phone as he gets upset when talking about her and the involuntary committ. Safety maintained.

## 2016-05-19 NOTE — Plan of Care (Signed)
Problem: Health Behavior/Discharge Planning: Goal: Compliance with prescribed medication regimen will improve Outcome: Progressing Taking his medications as prescribed

## 2016-05-19 NOTE — Plan of Care (Signed)
Problem: Health Behavior/Discharge Planning: Goal: Compliance with prescribed medication regimen will improve Outcome: Progressing Patient taking his medication and getting involved in unit activities

## 2016-05-19 NOTE — Progress Notes (Signed)
D: Pt continues to stay to himself this evening. He forwards little upon approach. Denies SI/HI/AVH at this time. Denies pain. No behavioral issues at this time. Pt refuses evening dose of restoril, stating "I don't think I'll need that tonight." A: Emotional support and encouragement provided. Medications administered with education. q15 minute safety checks maintained. R: Pt remains free from harm. Will continue to monitor.

## 2016-05-19 NOTE — Plan of Care (Signed)
Problem: Coping: Goal: Ability to cope will improve Outcome: Progressing Patient verbalizes he does not believe he should have been involuntarily committed. Safety maintained.

## 2016-05-19 NOTE — Progress Notes (Signed)
1935: Patient in bed awake. Alert and oriented but displaying bizarre behaviors at times with inappropriate laughing. Focused on being discharged "because there is nothing wrong with me...". Suspicious and denial. Reports that his mother visited and they both had a good conversation.  Patient was encouraged to get more involved in unit activities and to reports his feelings and concerns as needed.  Safety precautions maintained per unit protocol. 2100: patient went to the dayroom, stayed for a little while, had a snack then returned to his room to rest. Continues to express that he needs to be discharged tomorrow "noone can make me stay, my mom did an involuntary commitment, I didn't sign it.Marland Kitchen.ibuprofen will leave whenever I want to..". Continues to laugh inappropriately at times with poor judgement, minimizing his need for mental health services. Safety precautions reinforced.  2300: Patient presented to the medication room, took his bedtime medications reluctantly, saying that he does not need to take them anymore "I got an injection a few days ago, no more medications...". Took all the medications per nurse's encouragements. Currently in bed resting and staff continue to monitor.  [id number]: Patient has remained asleep, eyes closed, respirations WNL. Staff continue to monitor. .Marland Kitchen

## 2016-05-19 NOTE — Progress Notes (Signed)
[facility]  05/19/2016 6:48 PM Zollie BeckersDaetwann Crayton  MRN:  [id number]  Subjective:  [name] is a [age] year old male with a history of schizophrenia admitted floridly psychotic in the context of treatment noncompliance.  Follow-up Saturday the fourth. Patient has been complaining more of anxiety. Last night he was agitated a little bit but not hostile. Required some when necessary medicine. Today he is a little more argumentative. Insist that he had no symptoms and no reason to be in the hospital. He paces occasionally and then returns to his room. Looks a little bit paranoid but nothing hostile expressed. Principal Problem: Schizoaffective disorder, bipolar type (HCC) Diagnosis:   Patient Active Problem List   Diagnosis Date Noted  . Tobacco use disorder [F17.200] 05/15/2016  . Schizoaffective disorder, bipolar type (HCC) [F25.0] 05/15/2016   Total Time spent with patient: 20 minutes  Past Psychiatric History: Schizophrenia.  Past Medical History:  Past Medical History  Diagnosis Date  . Schizoaffective disorder (HCC)    History reviewed. No pertinent past surgical history. Family History:  Family History  Problem Relation Age of Onset  . Depression Other    Family Psychiatric  History: None reported. Social History:  History  Alcohol Use No    Comment: denies regular alcohol use     History  Drug Use  . Yes  . Special: Marijuana    Social History   Social History  . Marital Status: Single    Spouse Name: N/A  . Number of Children: N/A  . Years of Education: N/A   Social History Main Topics  . Smoking status: Heavy Tobacco Smoker -- 0.50 packs/day    Types: Cigarettes  . Smokeless tobacco: None  . Alcohol Use: No     Comment: denies regular alcohol use  . Drug Use: Yes    Special: Marijuana  . Sexual Activity: Yes   Other Topics Concern  . None   Social History Narrative   Additional Social History:                         Sleep:  Fair  Appetite:  Fair  Current Medications: Current Facility-Administered Medications  Medication Dose Route Frequency Provider Last Rate Last Dose  . alum & mag hydroxide-simeth (MAALOX/MYLANTA) 200-200-20 MG/5ML suspension 30 mL  30 mL Oral Q4H PRN Charm RingsJamison Y Lord, NP      . divalproex (DEPAKOTE) DR tablet 500 mg  500 mg Oral Q12H Jolanta B Pucilowska, MD   500 mg at 05/19/16 1000  . haloperidol (HALDOL) tablet 10 mg  10 mg Oral QHS Jolanta B Pucilowska, MD   10 mg at 05/18/16 2128  . haloperidol decanoate (HALDOL DECANOATE) 100 MG/ML injection 100 mg  100 mg Intramuscular Q30 days Shari ProwsJolanta B Pucilowska, MD   100 mg at 05/16/16 1447  . magnesium hydroxide (MILK OF MAGNESIA) suspension 30 mL  30 mL Oral Daily PRN Charm RingsJamison Y Lord, NP      . nicotine (NICODERM CQ - dosed in mg/24 hours) patch 21 mg  21 mg Transdermal Daily Jimmy FootmanAndrea Hernandez-Gonzalez, MD   21 mg at 05/15/16 1530  . QUEtiapine (SEROQUEL) tablet 50 mg  50 mg Oral Q4H PRN Audery AmelJohn T Clapacs, MD      . temazepam (RESTORIL) capsule 15 mg  15 mg Oral QHS Shari ProwsJolanta B Pucilowska, MD   15 mg at 05/17/16 2206    Lab Results:  No results found for this or any previous visit (from the  past 48 hour(s)).  Blood Alcohol level:  Lab Results  Component Value Date   ETH <5 05/13/2016   ETH <5 07/27/2015    Physical Findings: AIMS:  , ,  ,  , Dental Status Current problems with teeth and/or dentures?: No Does patient usually wear dentures?: No  CIWA:    COWS:     Musculoskeletal: Strength & Muscle Tone: within normal limits Gait & Station: normal Patient leans: N/A  Psychiatric Specialty Exam: Physical Exam  Nursing note and vitals reviewed. Constitutional: He appears well-developed and well-nourished.  HENT:  Head: Normocephalic and atraumatic.  Eyes: Conjunctivae are normal. Pupils are equal, round, and reactive to light.  Neck: Normal range of motion.  Cardiovascular: Normal heart sounds.   Respiratory: Effort normal.  GI: Soft.   Musculoskeletal: Normal range of motion.  Neurological: He is alert.  Skin: Skin is warm and dry.  Psychiatric: His mood appears anxious. His affect is blunt. His speech is delayed. He is agitated. Thought content is paranoid.    Review of Systems  Psychiatric/Behavioral: Positive for hallucinations.  All other systems reviewed and are negative.   Blood pressure 122/67, pulse 72, temperature 98.5 F (36.9 C), temperature source Oral, resp. rate 18, height 6' 1.62" (1.87 m), weight 78.472 kg (173 lb).Body mass index is 22.44 kg/(m^2).  General Appearance: Disheveled  Eye Contact:  Minimal  Speech:  Slow  Volume:  Decreased  Mood:  Anxious  Affect:  Flat  Thought Process:  Disorganized  Orientation:  Full (Time, Place, and Person)  Thought Content:  Delusions, Hallucinations: Auditory and Paranoid Ideation  Suicidal Thoughts:  No  Homicidal Thoughts:  No  Memory:  Immediate;   Fair Recent;   Fair Remote;   Fair  Judgement:  Poor  Insight:  Lacking  Psychomotor Activity:  Decreased  Concentration:  Concentration: Fair and Attention Span: Fair  Recall:  [employer] of Knowledge:  Fair  Language:  Fair  Akathisia:  No  Handed:  Right  AIMS (if indicated):     Assets:  Physical Health Resilience  ADL's:  Intact  Cognition:  WNL  Sleep:  Number of Hours: 7     Treatment Plan Summary: Daily contact with patient to assess and evaluate symptoms and progress in treatment and Medication management   Mr. Locker has a history of schizoaffective disorder and substance use. He was admitted for worsening of psychosis in the context of treatment noncompliance and substance. \  1. Mood and psychosis. The patient was started on Invega but prefers Haldol for psychosis. He requested Depakote to stabilize the mood. We offered Haldol decanoate injection on 05/17/2016.   2. Insomnia. He did not respond to Ativan or trazodone. We will start Restoril.   3. Smoking. Nicotine patch is  available.   4. Cannabis use disorder. The patient minimizes problems and declines treatment.  5. Metabolic syndrome screening.Lipid panel, TSH and Hemoglobin A1care normal. Prolactin 32.    6. Disposition. The patient will be discharged to home with his cousin. He will follow up with [facility] in [place]. Schizophrenia. Partial improvement. Continue when necessary medicine. No other change. Supportive counseling. Encourage continued medicine compliance. Mordecai Rasmussen, MD 05/19/2016, 6:48 PM

## 2016-05-20 NOTE — Plan of Care (Signed)
Problem: Health Behavior/Discharge Planning: Goal: Compliance with prescribed medication regimen will improve Outcome: Progressing Pt taking medications as prescribed.  Problem: Safety: Goal: Periods of time without injury will increase Outcome: Progressing Pt remains free from harm.

## 2016-05-20 NOTE — Progress Notes (Signed)
[facility] MD Progress Note  05/20/2016 10:52 AM Austin Gutierrez  MRN:  [id number]  Subjective:  Austin Gutierrez reports much improvement. He adamantly denies any symptoms of depression, anxiety, or psychosis. Staff did not report any auditory hallucinations this weekend. The patient is compliant with treatment and participates in programming. There are no somatic complaints. He accepted Haldol decanoate injection. He tolerates medications well. He is very worried about the fate of his vehicle that most likely is with his cousin.   Principal Problem: Schizoaffective disorder, bipolar type (HCC) Diagnosis:   Patient Active Problem List   Diagnosis Date Noted  . Tobacco use disorder [F17.200] 05/15/2016  . Schizoaffective disorder, bipolar type (HCC) [F25.0] 05/15/2016   Total Time spent with patient: 20 minutes  Past Psychiatric History: Schizophrenia.  Past Medical History:  Past Medical History  Diagnosis Date  . Schizoaffective disorder (HCC)    History reviewed. No pertinent past surgical history. Family History:  Family History  Problem Relation Age of Onset  . Depression Other    Family Psychiatric  History: None reported. Social History:  History  Alcohol Use No    Comment: denies regular alcohol use     History  Drug Use  . Yes  . Special: Marijuana    Social History   Social History  . Marital Status: Single    Spouse Name: N/A  . Number of Children: N/A  . Years of Education: N/A   Social History Main Topics  . Smoking status: Heavy Tobacco Smoker -- 0.50 packs/day    Types: Cigarettes  . Smokeless tobacco: None  . Alcohol Use: No     Comment: denies regular alcohol use  . Drug Use: Yes    Special: Marijuana  . Sexual Activity: Yes   Other Topics Concern  . None   Social History Narrative   Additional Social History:                         Sleep: Fair  Appetite:  Fair  Current Medications: Current Facility-Administered Medications   Medication Dose Route Frequency Provider Last Rate Last Dose  . alum & mag hydroxide-simeth (MAALOX/MYLANTA) 200-200-20 MG/5ML suspension 30 mL  30 mL Oral Q4H PRN Charm Rings, NP      . divalproex (DEPAKOTE) DR tablet 500 mg  500 mg Oral Q12H Sonnie Pawloski B Jaquita Bessire, MD   500 mg at 05/20/16 0841  . haloperidol (HALDOL) tablet 10 mg  10 mg Oral QHS Garwood Wentzell B Doreene Forrey, MD   10 mg at 05/19/16 2240  . haloperidol decanoate (HALDOL DECANOATE) 100 MG/ML injection 100 mg  100 mg Intramuscular Q30 days Shari Prows, MD   100 mg at 05/16/16 1447  . magnesium hydroxide (MILK OF MAGNESIA) suspension 30 mL  30 mL Oral Daily PRN Charm Rings, NP      . nicotine (NICODERM CQ - dosed in mg/24 hours) patch 21 mg  21 mg Transdermal Daily Jimmy Footman, MD   21 mg at 05/15/16 1530  . QUEtiapine (SEROQUEL) tablet 50 mg  50 mg Oral Q4H PRN Audery Amel, MD      . temazepam (RESTORIL) capsule 15 mg  15 mg Oral QHS Dominica Kent B Ahley Bulls, MD   15 mg at 05/19/16 2240    Lab Results: No results found for this or any previous visit (from the past 48 hour(s)).  Blood Alcohol level:  Lab Results  Component Value Date   [facility] <5 05/13/2016  ETH <5 07/27/2015    Physical Findings: AIMS:  , ,  ,  , Dental Status Current problems with teeth and/or dentures?: No Does patient usually wear dentures?: No  CIWA:    COWS:     Musculoskeletal: Strength & Muscle Tone: within normal limits Gait & Station: normal Patient leans: N/A  Psychiatric Specialty Exam: Physical Exam  Nursing note and vitals reviewed.   Review of Systems  Psychiatric/Behavioral: Positive for hallucinations.  All other systems reviewed and are negative.   Blood pressure 132/80, pulse 64, temperature 97.7 F (36.5 C), temperature source Oral, resp. rate 18, height 6' 1.62" (1.87 m), weight 78.472 kg (173 lb).Body mass index is 22.44 kg/(m^2).  General Appearance: Casual  Eye Contact:  Good  Speech:  Clear and  Coherent  Volume:  Normal  Mood:  Anxious  Affect:  Appropriate  Thought Process:  Goal Directed  Orientation:  Full (Time, Place, and Person)  Thought Content:  WDL  Suicidal Thoughts:  No  Homicidal Thoughts:  No  Memory:  Immediate;   Fair Recent;   Fair Remote;   Fair  Judgement:  Poor  Insight:  Lacking  Psychomotor Activity:  Normal  Concentration:  Concentration: Fair and Attention Span: Fair  Recall:  [employer]Fair  Fund of Knowledge:  Fair  Language:  Fair  Akathisia:  No  Handed:  Right  AIMS (if indicated):     Assets:  Communication Skills Desire for Improvement Housing Physical Health Resilience Social Support Transportation  ADL's:  Intact  Cognition:  WNL  Sleep:  Number of Hours: 7.5     Treatment Plan Summary: Daily contact with patient to assess and evaluate symptoms and progress in treatment and Medication management   Austin Gutierrez has a history of schizoaffective disorder and substance use. He was admitted for worsening of psychosis in the context of treatment noncompliance and substance. \  1. Mood and psychosis. The patient was started on Invega but prefers Haldol for psychosis. He requested Depakote to stabilize the mood. We offered Haldol decanoate injection on 05/17/2016. VPA level in am.  2. Insomnia. He did not respond to Ativan or trazodone. We started Restoril.   3. Smoking. Nicotine patch is available.   4. Cannabis use disorder. The patient minimizes problems and declines treatment.  5. Metabolic syndrome screening.Lipid panel, TSH and Hemoglobin A1care normal. Prolactin 32.   6. Disposition. The patient will be discharged to home with his cousin. [facility] [place].  Kristine LineaJolanta Avamae Dehaan, MD 05/20/2016, 10:52 AM

## 2016-05-20 NOTE — [facility] Group Notes (Signed)
[facility] LCSW Group Therapy   05/20/2016 9:30am Type of Therapy: Group Therapy   Participation Level: Active   Participation Quality: Attentive, Sharing and Supportive   Affect: Depressed and Flat   Cognitive: Alert and Oriented   Insight: Developing/Improving and Engaged   Engagement in Therapy: Developing/Improving and Engaged   Modes of Intervention: Clarification, Confrontation, Discussion, Education, Exploration,  Limit-setting, Orientation, Problem-solving, Rapport Building, Dance movement psychotherapisteality Testing, Socialization and Support   Summary of Progress/Problems: Pt identified obstacles faced currently and processed barriers involved in overcoming these obstacles. Pt identified steps necessary for overcoming these obstacles and explored motivation (internal and external) for facing these difficulties head on. Pt further identified one area of concern in their lives and chose a goal to focus on for today. Pt shared with the group the pt's desire to visit the beach.  The pt noted the other group member's suggestions as to how to achieve the pt.'s goal and reported he understood that this may not be possible.  Pt shared that the pt was was looking forward to achieving the pt.'s goal and shared perhaps the pt could continue to hope even though this might not be able since he had "goten things twisted" in his life as of late.  Pt shared he hopes to achieve his goal once he is able to get his life straight.  At times, the pt's sharing was clear and at times unintelligible.  Pt was polite and cooperative with the CSW and other group members and focused and attentive to the topics discussed and the sharing of others.     Dorothe PeaJonathan F. Jessice Madill, LCSWA, LCAS  05/20/16

## 2016-05-20 NOTE — Plan of Care (Signed)
Problem: Education: Goal: Will be free of psychotic symptoms Outcome: Not Progressing Patient remains suspicious rt medicines and is angry rt involuntary commitment. Patient did visit with Mother last evening.

## 2016-05-20 NOTE — Progress Notes (Signed)
Recreation Therapy Notes  Date: 06.05.17 Time: 9:30 am Location: Craft Room  Group Topic: Self-expression  Goal Area(s) Addresses:  Patient will identify one color per emotion listed on wheel. Patient will verbalize one emotion experienced during session. Patient will be educated on other forms of self-expression.  Behavioral Response: Intermittently Attentive, Left early  Intervention: Emotion Wheel  Activity: Patients were given an Emotion Wheel worksheet and instructed to pick a color for each emotion listed on the wheel.  Education: LRT educated patients on other forms of self-expression.  Education Outcome: Patient left before LRT educated group.   Clinical Observations/Feedback: Patient did not participate in group activity. Patient looked at his medical bracelets. Patient wanted to leave group because he said he needed to think. LRT encouraged patient to stay in group. Patient refused and left group at approximately 9:54 am. Patient did not return to group.  Jacquelynn CreeGreene,Luca Burston M, LRT/CTRS 05/20/2016 10:19 AM

## 2016-05-20 NOTE — Progress Notes (Signed)
Patient with sad affect, cooperative behavior with meals. Patient suspicious and slightly resistive with am medicine. At first refuses med then changes mind and takes med. Patient encouraged to attend therapy groups and with minimal interaction with peers. In dayroom with peers, then rests in bed. Safety maintained.

## 2016-05-21 LAB — VALPROIC ACID LEVEL: VALPROIC ACID LVL: 91 ug/mL (ref 50.0–100.0)

## 2016-05-21 MED ORDER — DIVALPROEX SODIUM 500 MG PO DR TAB: 500.0000 mg | DELAYED_RELEASE_TABLET | Freq: Two times a day (BID) | ORAL | Status: DC

## 2016-05-21 MED ORDER — HALOPERIDOL 5 MG PO TABS: 5.0000 mg | ORAL_TABLET | Freq: Every day | ORAL | Status: DC

## 2016-05-21 MED ORDER — HALOPERIDOL DECANOATE 100 MG/ML IM SOLN: 100.0000 mg | INTRAMUSCULAR | Status: DC

## 2016-05-21 NOTE — [facility] Suicide Risk Assessment (Signed)
[facility]   Principal Problem: Schizoaffective disorder, bipolar type [facility](HCC) Discharge Diagnoses:  Patient Active Problem List   Diagnosis Date Noted  . Tobacco use disorder [F17.200] 05/15/2016  . Schizoaffective disorder, bipolar type (HCC) [F25.0] 05/15/2016    Total Time spent with patient: 30 minutes  Musculoskeletal: Strength & Muscle Tone: within normal limits Gait & Station: normal Patient leans: N/A  Psychiatric Specialty Exam: Review of Systems  Psychiatric/Behavioral: Positive for hallucinations.  All other systems reviewed and are negative.   Blood pressure 134/80, pulse 68, temperature 98.6 F (37 C), temperature source Oral, resp. rate 18, height 6' 1.62" (1.87 m), weight 78.472 kg (173 lb).Body mass index is 22.44 kg/(m^2).  General Appearance: Casual  Eye Contact::  Fair  Speech:  Clear and Coherent409  Volume:  Normal  Mood:  Euthymic  Affect:  Appropriate  Thought Process:  Goal Directed  Orientation:  Full (Time, Place, and Person)  Thought Content:  WDL  Suicidal Thoughts:  No  Homicidal Thoughts:  No  Memory:  Immediate;   Fair Recent;   Fair Remote;   Fair  Judgement:  Poor  Insight:  Shallow  Psychomotor Activity:  Normal  Concentration:  Fair  Recall:  [employer]Fair  Fund of Knowledge:Fair  Language: Fair  Akathisia:  No  Handed:  Right  AIMS (if indicated):     Assets:  Communication Skills Desire for Improvement Housing Physical Health Resilience Social Support Transportation  Sleep:  Number of Hours: 6.5  Cognition: WNL  ADL's:  Intact   Mental Status Per Nursing Assessment::   On Admission:  NA  Demographic Factors:  Male, Low socioeconomic status and Unemployed  Loss Factors: NA  Historical Factors: Impulsivity  Risk Reduction Factors:   Sense of responsibility to family, Living with another person, especially a relative and Positive social support  Continued Clinical Symptoms:  Schizophrenia:    Less than 37 years old Paranoid or undifferentiated type  Cognitive Features That Contribute To Risk:  None    Suicide Risk:  Minimal: No identifiable suicidal ideation.  Patients presenting with no risk factors but with morbid ruminations; may be classified as minimal risk based on the severity of the depressive symptoms  Follow-up Information    Follow up with [facility].   Why:  Please call the [facility] to schedule your in-person interview for your application for disability and/or SS   Contact information:   [facility] [address] [place], [place] [address] Ph: [phone] Fax: [phone]      Follow up with [facility] [place].   Why:  Please arrive to the walk-in clinic Monday- Friday between 9-4pm for your assessment for your hospital follow up for medication management.  ARRIVE EARLY TO MINIMIZE WAIT TIME   Contact information:   [address] [place], [place] [address] Phone: [phone] Fax: [phone]      Go to to follow up.      Plan Of Care/Follow-up recommendations:  Activity:  As tolerated. Diet:  Low sodium heart healthy. Other:  Keep follow-up appointments.  Kristine LineaJolanta Pucilowska, MD 05/21/2016, 11:56 AM

## 2016-05-21 NOTE — Progress Notes (Signed)
Recreation Therapy Notes  Date: 06.06.17 Time: 9:30 am Location: Craft Room  Group Topic: Goal Setting  Goal Area(s) Addresses:  Patient will identify at least one goal. Patient will identify at least one obstacle.  Behavioral Response: Did not attend  Intervention: Recovery Goal Chart  Activity: Patients were instructed to make a Recovery Goal Chart including goals, obstacles, the date they started working on their goals, and the date they achieved their goals.  Education: LRT educated patients on ways they can celebrate in a healthy way.  Education Outcome: Patient did not attend group.  Clinical Observations/Feedback: Patient did not attend group.  Jacquelynn CreeGreene,Joneisha Miles M, LRT/CTRS 05/21/2016 10:16 AM

## 2016-05-21 NOTE — Progress Notes (Signed)
  [facility] :  Will you be returning to the same living situation after discharge:  Yes,    At discharge, do you have transportation home?: Yes,    Do you have the ability to pay for your medications: Yes,     Release of information consent forms completed and in the chart;  Patient's signature needed at discharge.  Patient to Follow up at: Follow-up Information    Follow up with [facility].   Why:  Please call the [facility] to schedule your in-person interview for your application for disability and/or SS   Contact information:   [facility] [address] in [place], [place] [address] Ph: [phone] Fax: [phone]      Follow up with [facility] [place].   Why:  Please arrive to the walk-in clinic Monday- Friday between 9-4pm for your assessment for your hospital follow up for medication management.  ARRIVE EARLY TO MINIMIZE WAIT TIME   Contact information:   [address] [place], [place] [address] Phone: [phone] Fax: [phone]      Go to to follow up.      Next level of care provider has access to [facility] Link:no  Safety Planning and Suicide Prevention discussed: Yes,     Have you used any form of tobacco in the last 30 days? (Cigarettes, Smokeless Tobacco, Cigars, and/or Pipes): Yes  Has patient been referred to the Quitline?: Patient refused referral  Patient has been referred for addiction treatment: Yes  Madelynn Malson, Cleda DaubSara P, MSW, LCSW 05/21/2016, 4:57 PM

## 2016-05-21 NOTE — [facility] Suicide Risk Assessment (Signed)
[facility] INPATIENT:  Family/Significant Other Suicide Prevention Education  Suicide Prevention Education:  Contact Attempts: Westly PamWanda Huntley, mother [phone], (name of family member/significant other) has been identified by the patient as the family member/significant other with whom the patient will be residing, and identified as the person(s) who will aid the patient in the event of a mental health crisis.  With written consent from the patient, two attempts were made to provide suicide prevention education, prior to and/or following the patient's discharge.  We were unsuccessful in providing suicide prevention education.  A suicide education pamphlet was given to the patient to share with family/significant other.  Date and time of first attempt: 6/5/4:00pm Date and time of second attempt:6/6/9:30 am  Left messages on home and cell phone voicemail.  Glennon MacLaws, Curtiss [name] P, MSW, LCSW 05/21/2016, 4:56 PM

## 2016-05-21 NOTE — Progress Notes (Signed)
Pt discharged home. DC instructions provided and explained. Medications reviewed. Rx given. 7 day supply given. Belongings returned. All questions answered. Pt stable at discharge. Denies SI, HI, AVH.

## 2016-05-21 NOTE — Discharge Summary (Signed)
Physician Discharge Summary Note  Patient:  Austin Gutierrez is an 35 y.o., male MRN:  [id number] DOB:  March 10, 1981 Patient phone:  [phone] (home)  Patient address:   [address] [place] [address],  Total Time spent with patient: 30 minutes  Date of Admission:  05/14/2016 Date of Discharge: 05/21/2016  Reason for Admission:  Psychotic break.  Patient is a 35 year old single African-American male with prior diagnosis of schizoaffective disorder bipolar type.   The patient presented to to [facility] emergency department by law enforcement on 5/29 after being petitioned for involuntary commitment by his mother, Austin Gutierrez [phone]. Per Affidavit and Petition: "Respondent has been diagnosed with schizophrenia. Respondent has not been taking medication. He is prescribed Haldol and some other unknown medication. Respondent is stating he has a knife on him and posted on Facebook that he has a 40-caliber with hollow point bullets and that someone killed his mom at her job. Respondent is stating that someone is setting him up to kill him and that the FBI is blocking streets. Respondent is acting aggressive towards people that are walking and driving by making hand movements like he is shooting at them. Respondent has also stated that he is going to kill himself with a knife and doesn't care if it hurts people around him."  In ED, patient was agitated, has circumstantial thought process and is a poor historian. His explanation of events of the day is inconsistent. Per [facility] police there was a gun in the patient's car.  Patient was last inpatient at [facility] in August 2016 due to severe depression, persecutory delusions and auditory command hallucinations to kill himself.   Today during the assessment the patient tells me he does not understand why he was putting into the hospital. He said he has done nothing to hurt himself or anybody else. Assessment the patient appears to  have very limited insight into his illness. He appears to be somewhat grandiose has been telling the nurses staff that he drives fancy car and that most likely They have seen him driving around [place]. 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 He denied to me having depressed mood, problems with his sleep, appetite energy or concentration. He denies suicidality, homicidality or having auditory or visual hallucinations. He denies any symptoms consistent with mania or hypomania. He denies any history of trauma.  Substance abuse history the patient tells me he smokes about one pack of cigarettes per day. He smokes marijuana (once in a while). He denies the use of alcohol or any other illicit substances  Associated Signs/Symptoms: Depression Symptoms: denies (Hypo) Manic Symptoms: Elevated Mood, Grandiosity, Impulsivity, Anxiety Symptoms: denies Psychotic Symptoms: Paranoia, PTSD Symptoms: Negative   Past Psychiatric History: Patient has been hospitalized outcome behavioral in [place] in 2015 and 2016. He has been diagnosed with schizoaffective disorder bipolar type. The patient is currently not following our outpatient. He is not currently taking any psychiatric medications. He denies any history of prior suicidal attempts  Past Medical History: Patient denies any history of seizures. 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              Family Psychiatric History: Patient reports that one of his uncles was was "patient's of in the head". He reports having a cousin with a disability. He believes his aunt has tried to commit suicide. Patient reports that his mother in  the past used to abuse alcohol and drugs. He also reports having an uncle who abuses alcohol and drugs.  Tobacco Screening: Patient is proximal half a pack a day  Social History: Patient tells me he was raised by his  grandmother. He is currently living with his cousin in [place]. He is single and couldn't answer when I ask him if he had children are not. As far as education the patient isn't sure where he completed 10 or 11. Patient tells me in the past he was charged with larceny in 2011 with possession however he says both charges were dismissed. He denies having any current legal charges pending       Principal Problem: Schizoaffective disorder, bipolar type [facility]) Discharge Diagnoses: Patient Active Problem List   Diagnosis Date Noted  . Tobacco use disorder [F17.200] 05/15/2016  . Schizoaffective disorder, bipolar type (HCC) [F25.0] 05/15/2016     Past Medical History:  Past Medical History  Diagnosis Date  . Schizoaffective disorder (HCC)    History reviewed. No pertinent past surgical history. Family History:  Family History  Problem Relation Age of Onset  . Depression Other    Social History:  History  Alcohol Use No    Comment: denies regular alcohol use     History  Drug Use  . Yes  . Special: Marijuana    Social History   Social History  . Marital Status: Single    Spouse Name: N/A  . Number of Children: N/A  . Years of Education: N/A   Social History Main Topics  . Smoking status: Heavy Tobacco Smoker -- 0.50 packs/day    Types: Cigarettes  . Smokeless tobacco: None  . Alcohol Use: No     Comment: denies regular alcohol use  . Drug Use: Yes    Special: Marijuana  . Sexual Activity: Yes   Other Topics Concern  . None   Social History Narrative    Hospital Course:    Mr. Austin Gutierrez has a history of schizoaffective disorder and substance use. He was admitted for worsening of psychosis in the context of treatment noncompliance and substance use.  1. Mood and psychosis. The patient was started on Invega but prefers Haldol for psychosis. He requested Depakote to stabilize the mood. We offered Haldol decanoate injection on 05/17/2016. VPA level 91.  2. Insomnia. He  did not respond to Ativan or trazodone. We was offered Restoril.    3. Smoking. Nicotine patch was available.   4. Cannabis use disorder. The patient minimizes problems and declines treatment.  5. Metabolic syndrome screening.Lipid panel, TSH and Hemoglobin A1care normal. Prolactin 32.   6. Disposition. The patient was discharged to home with his cousin. He will follow up with [facility] in [place].  Physical Findings: AIMS:  , ,  ,  , Dental Status Current problems with teeth and/or dentures?: No Does patient usually wear dentures?: No  CIWA:    COWS:     Musculoskeletal: Strength & Muscle Tone: within normal limits Gait & Station: normal Patient leans: N/A  Psychiatric Specialty Exam: Physical Exam  Nursing note and vitals reviewed.   Review of Systems  Psychiatric/Behavioral: Positive for hallucinations.  All other systems reviewed and are negative.   Blood pressure 134/80, pulse 68, temperature 98.6 F (37 C), temperature source Oral, resp. rate 18, height 6' 1.62" (1.87 m), weight 78.472 kg (173 lb).Body mass index is 22.44 kg/(m^2).  See SRA.  Sleep:  Number of Hours: 6.5     Have you used any form of tobacco in the last 30 days? (Cigarettes, Smokeless Tobacco, Cigars, and/or Pipes): Yes  Has this patient used any form of tobacco in the last 30 days? (Cigarettes, Smokeless Tobacco, Cigars, and/or Pipes) Yes, Yes, A prescription for an FDA-approved tobacco cessation medication was offered at discharge and the patient refused  Blood Alcohol level:  Lab Results  Component Value Date   [facility] <5 05/13/2016   ETH <5 07/27/2015    Metabolic Disorder Labs:  Lab Results  Component Value Date   HGBA1C 5.5 05/16/2016   MPG 117 07/30/2015   MPG 120* 05/25/2014   Lab Results  Component Value Date   PROLACTIN 32.0* 05/16/2016   PROLACTIN 24.5* 08/01/2015   Lab Results  Component Value Date    CHOL 100 05/16/2016   TRIG 44 05/16/2016   HDL 44 05/16/2016   CHOLHDL 2.3 05/16/2016   VLDL 9 05/16/2016   LDLCALC 47 05/16/2016   LDLCALC 75 07/30/2015    See Psychiatric Specialty Exam and Suicide Risk Assessment completed by Attending Physician prior to discharge.  Discharge destination:  Home  Is patient on multiple antipsychotic therapies at discharge:  No   Has Patient had three or more failed trials of antipsychotic monotherapy by history:  No  Recommended Plan for Multiple Antipsychotic Therapies: NA  Discharge Instructions    Diet - low sodium heart healthy    Complete by:  As directed      Increase activity slowly    Complete by:  As directed             Medication List    STOP taking these medications        divalproex 250 MG 24 hr tablet  Commonly known as:  DEPAKOTE ER  Replaced by:  divalproex 500 MG DR tablet     mirtazapine 15 MG tablet  Commonly known as:  REMERON      TAKE these medications      Indication   divalproex 500 MG DR tablet  Commonly known as:  DEPAKOTE  Take 1 tablet (500 mg total) by mouth every 12 (twelve) hours.   Indication:  Schizophrenia     haloperidol 5 MG tablet  Commonly known as:  HALDOL  Take 1 tablet (5 mg total) by mouth at bedtime.   Indication:  Schizophrenia, mood stabilization     haloperidol decanoate 100 MG/ML injection  Commonly known as:  HALDOL DECANOATE  Inject 1 mL (100 mg total) into the muscle every 30 (thirty) days.   Indication:  Schizophrenia           Follow-up Information    Follow up with [facility].   Why:  Please call the [facility] to schedule your in-person interview for your application for disability and/or SS   Contact information:   [facility] [address] [place] , [place] [id number] Ph: [phone] Fax: [phone]      Follow up with [facility] [place].    Why:  Please arrive to the walk-in clinic Monday- Friday between 9-4pm for your assessment for your hospital follow up for medication management.  ARRIVE EARLY TO MINIMIZE WAIT TIME   Contact information:   [address] [place], [place] [address] Phone: [phone](336) [phone] Fax: [phone]      Go to to follow up.  Follow-up recommendations:  Activity:  As tolerated. Diet:  Low sodium heart healthy. Other:  Keep follow-up appointments.  Comments:    Signed: Kristine Linea, MD 05/21/2016, 12:00 PM

## 2016-05-21 NOTE — Progress Notes (Signed)
D: Pt stays in his room most of this evening, only coming out for snacks and medications. Pt tells Clinical research associatewriter several times the he believes he is leaving tomorrow. He maintains minimal interaction with staff and peers. Denies SI/HI/AVH at this time. Denies pain. Pt rates depression and anxiety 0/10. A: Emotional support and encouragement provided. Medications administered with education. q15 minute safety checks maintained. R: Pt remains free from harm. Will continue to monitor.

## 2016-05-22 NOTE — Progress Notes (Signed)
Recreation Therapy Notes  INPATIENT RECREATION TR PLAN  Patient Details Name: Austin Gutierrez MRN: [id number] DOB: 01-08-1981 Today's Date: 05/22/2016  Rec Therapy Plan Is patient appropriate for Therapeutic Recreation?: Yes Treatment times per week: At least once a week TR Treatment/Interventions: 1:1 session, Group participation (Comment) (Appropriate participation in daily recreational therapy tx)  Discharge Criteria Pt will be discharged from therapy if:: Treatment goals are met, Discharged Treatment plan/goals/alternatives discussed and agreed upon by:: Patient/family  Discharge Summary Short term goals set: See Care Plan Short term goals met: Complete Progress toward goals comments: One-to-one attended Which groups?: Self-esteem, Leisure education, Other (Comment) (Self-expression) One-to-one attended: Self-esteem, anger management Reason goals not met: N/A Therapeutic equipment acquired: None Reason patient discharged from therapy: Discharge from hospital Pt/family agrees with progress & goals achieved: Yes Date patient discharged from therapy: 05/21/16   Leonette [facility], LRT/CTRS 05/22/2016, 9:16 AM

## 2016-05-22 NOTE — Tx Team (Signed)
Interdisciplinary Treatment Plan Update (Adult)  Date:  05/21/2016 Time Reviewed:  2:07 PM Progress in Treatment: Attending groups: Yes. Participating in groups: Yes. Taking medication as prescribed: Yes. Tolerating medication: Yes. Family/Significant othe contact made: No, will contact: mother  Patient understands diagnosis: No. and As evidenced by: limited insight  Discussing patient identified problems/goals with staff: Yes. Medical problems stabilized or resolved: Yes. Denies suicidal/homicidal ideation: Yes. Issues/concerns per patient self-inventory: Yes. Other:  New problem(s) identified: No, Describe: NA  Discharge Plan or Barriers: Pt plans to return home and follow up with outpatient.   Reason for Continuation of Hospitalization: Hallucinations Medication stabilization  Comments:Austin Gutierrez has a history of schizoaffective disorder and substance use. He was admitted for worsening of psychosis in the context of treatment noncompliance and substance use.  1. Mood and psychosis. The patient was started on Invega but prefers Haldol for psychosis. He requested Depakote to stabilize the mood. We offered Haldol decanoate injection on 05/17/2016. VPA level 91.  2. Insomnia. He did not respond to Ativan or trazodone. We was offered Restoril.   3. Smoking. Nicotine patch was available.   4. Cannabis use disorder. The patient minimizes problems and declines treatment.  5. Metabolic syndrome screening.Lipid panel, TSH and Hemoglobin A1care normal. Prolactin 32.   6. Disposition. The patient was discharged to home with his cousin. He will follow up with [facility] in [place].  Estimated length of stay: 1 days   New goal(s): NA  Review of initial/current patient goals per problem list:  1. Goal(s): Patient will participate in aftercare plan  Met:  Target date: at discharge  As evidenced by: Patient will participate within aftercare plan AEB aftercare provider  and housing plan at discharge being identified.  2. Goal (s): Patient will demonstrate decreased symptoms of psychosis.  Met:No  Target date: at discharge  As evidenced by: Patient will not endorse signs of psychosis or be deemed stable for discharge by MD. Attendees: Patient:  Austin Gutierrez 6/7/20172:07 PM  Family:   6/7/20172:07 PM  Physician:  Orson Slick 6/7/20172:07 PM  Nursing:   Polly Cobia, RN 6/7/20172:07 PM  Case Manager:   6/7/20172:07 PM  Counselor:  Dossie Arbour, LCSW 6/7/20172:07 PM  Other:  Everitt Amber, Mason 6/7/20172:07 PM  Other:   6/7/20172:07 PM  Other:   6/7/20172:07 PM  Other:  6/7/20172:07 PM  Other:  6/7/20172:07 PM  Other:  6/7/20172:07 PM  Other:  6/7/20172:07 PM  Other:  6/7/20172:07 PM  Other:  6/7/20172:07 PM  Other:   6/7/20172:07 PM   Scribe for Treatment Team:   August Saucer, 05/22/2016, 2:07 PM, MSW, LCSW

## 2016-08-18 ENCOUNTER — Encounter (HOSPITAL_COMMUNITY): Payer: Self-pay | Admitting: Emergency Medicine

## 2016-08-18 ENCOUNTER — Emergency Department (HOSPITAL_COMMUNITY)
Admission: EM | Admit: 2016-08-18 | Discharge: 2016-08-18 | Disposition: A | Discharge status: Home / Self Care | Payer: No Typology Code available for payment source | Attending: Emergency Medicine | Admitting: Emergency Medicine

## 2016-08-18 DIAGNOSIS — M545 Low back pain: Secondary | ICD-10-CM | POA: Insufficient documentation

## 2016-08-18 DIAGNOSIS — F17210 Nicotine dependence, cigarettes, uncomplicated: Secondary | ICD-10-CM | POA: Insufficient documentation

## 2016-08-18 DIAGNOSIS — Z79899 Other long term (current) drug therapy: Secondary | ICD-10-CM | POA: Insufficient documentation

## 2016-08-18 DIAGNOSIS — M791 Myalgia: Secondary | ICD-10-CM | POA: Diagnosis not present

## 2016-08-18 DIAGNOSIS — F419 Anxiety disorder, unspecified: Secondary | ICD-10-CM | POA: Diagnosis not present

## 2016-08-18 DIAGNOSIS — F1290 Cannabis use, unspecified, uncomplicated: Secondary | ICD-10-CM | POA: Insufficient documentation

## 2016-08-18 DIAGNOSIS — Y92410 Unspecified street and highway as the place of occurrence of the external cause: Secondary | ICD-10-CM | POA: Diagnosis not present

## 2016-08-18 DIAGNOSIS — Y9389 Activity, other specified: Secondary | ICD-10-CM | POA: Insufficient documentation

## 2016-08-18 DIAGNOSIS — M7918 Myalgia, other site: Secondary | ICD-10-CM

## 2016-08-18 DIAGNOSIS — Y999 Unspecified external cause status: Secondary | ICD-10-CM | POA: Diagnosis not present

## 2016-08-18 DIAGNOSIS — M542 Cervicalgia: Secondary | ICD-10-CM | POA: Diagnosis present

## 2016-08-18 MED ORDER — CYCLOBENZAPRINE HCL 10 MG PO TABS: 10.0000 mg | ORAL_TABLET | Freq: Three times a day (TID) | ORAL | 0 refills | Status: AC | PRN

## 2016-08-18 NOTE — ED Notes (Signed)
Called patient from waiting room x1 with no response.
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
## 2016-08-18 NOTE — Discharge Instructions (Signed)
Read the information below.  Use the prescribed medication as directed.  Please discuss all new medications with your pharmacist.  You may return to the Emergency Department at any time for worsening condition or any new symptoms that concern you.    If you develop fevers, loss of control of bowel or bladder, weakness or numbness in your legs, or are unable to walk, return to the ER for a recheck.  °
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
## 2016-12-28 ENCOUNTER — Encounter (HOSPITAL_COMMUNITY): Payer: Self-pay | Admitting: Emergency Medicine

## 2016-12-28 ENCOUNTER — Emergency Department (HOSPITAL_COMMUNITY)
Admission: EM | Admit: 2016-12-28 | Discharge: 2016-12-28 | Disposition: A | Discharge status: Home / Self Care | Payer: Self-pay | Attending: Emergency Medicine | Admitting: Emergency Medicine

## 2016-12-28 DIAGNOSIS — R8290 Unspecified abnormal findings in urine: Secondary | ICD-10-CM | POA: Insufficient documentation

## 2016-12-28 DIAGNOSIS — Z5321 Procedure and treatment not carried out due to patient leaving prior to being seen by health care provider: Secondary | ICD-10-CM | POA: Insufficient documentation

## 2016-12-28 LAB — URINALYSIS, ROUTINE W REFLEX MICROSCOPIC
BACTERIA UA: NONE SEEN
BILIRUBIN URINE: NEGATIVE
Glucose, UA: NEGATIVE mg/dL
HGB URINE DIPSTICK: NEGATIVE
Ketones, ur: 20 mg/dL — AB
LEUKOCYTES UA: NEGATIVE
Nitrite: NEGATIVE
Protein, ur: 30 mg/dL — AB
Specific Gravity, Urine: 1.031 — ABNORMAL HIGH (ref 1.005–1.030)
pH: 5 (ref 5.0–8.0)

## 2016-12-28 NOTE — ED Triage Notes (Signed)
Pt c/o dark yellow urine. States he has decreased water intake, drinking more power-aide drinks.

## 2017-08-02 ENCOUNTER — Encounter (HOSPITAL_COMMUNITY): Payer: Self-pay | Admitting: Emergency Medicine

## 2017-08-02 ENCOUNTER — Emergency Department (HOSPITAL_COMMUNITY)
Admission: EM | Admit: 2017-08-02 | Discharge: 2017-08-02 | Disposition: A | Discharge status: Home / Self Care | Payer: Medicare Other | Attending: Emergency Medicine | Admitting: Emergency Medicine

## 2017-08-02 ENCOUNTER — Ambulatory Visit (HOSPITAL_COMMUNITY)
Admission: EM | Admit: 2017-08-02 | Discharge: 2017-08-02 | Disposition: A | Discharge status: Home / Self Care | Payer: Medicare Other | Attending: Internal Medicine | Admitting: Internal Medicine

## 2017-08-02 DIAGNOSIS — Z5321 Procedure and treatment not carried out due to patient leaving prior to being seen by health care provider: Secondary | ICD-10-CM | POA: Diagnosis not present

## 2017-08-02 DIAGNOSIS — L03115 Cellulitis of right lower limb: Secondary | ICD-10-CM

## 2017-08-02 DIAGNOSIS — M79601 Pain in right arm: Secondary | ICD-10-CM | POA: Insufficient documentation

## 2017-08-02 DIAGNOSIS — R21 Rash and other nonspecific skin eruption: Secondary | ICD-10-CM | POA: Insufficient documentation

## 2017-08-02 LAB — CBC WITH DIFFERENTIAL/PLATELET
BASOS ABS: 0 10*3/uL (ref 0.0–0.1)
BASOS PCT: 0 %
EOS ABS: 0.1 10*3/uL (ref 0.0–0.7)
Eosinophils Relative: 2 %
HCT: 41.1 % (ref 39.0–52.0)
Hemoglobin: 14.5 g/dL (ref 13.0–17.0)
Lymphocytes Relative: 20 %
Lymphs Abs: 1.6 10*3/uL (ref 0.7–4.0)
MCH: 30.7 pg (ref 26.0–34.0)
MCHC: 35.3 g/dL (ref 30.0–36.0)
MCV: 86.9 fL (ref 78.0–100.0)
MONO ABS: 0.3 10*3/uL (ref 0.1–1.0)
MONOS PCT: 4 %
NEUTROS PCT: 74 %
Neutro Abs: 5.7 10*3/uL (ref 1.7–7.7)
PLATELETS: 222 10*3/uL (ref 150–400)
RBC: 4.73 MIL/uL (ref 4.22–5.81)
RDW: 13.2 % (ref 11.5–15.5)
WBC: 7.8 10*3/uL (ref 4.0–10.5)

## 2017-08-02 LAB — COMPREHENSIVE METABOLIC PANEL
ALK PHOS: 42 U/L (ref 38–126)
ALT: 10 U/L — AB (ref 17–63)
ANION GAP: 7 (ref 5–15)
AST: 17 U/L (ref 15–41)
Albumin: 3.9 g/dL (ref 3.5–5.0)
BILIRUBIN TOTAL: 0.7 mg/dL (ref 0.3–1.2)
BUN: 17 mg/dL (ref 6–20)
CALCIUM: 9.1 mg/dL (ref 8.9–10.3)
CO2: 25 mmol/L (ref 22–32)
CREATININE: 1.39 mg/dL — AB (ref 0.61–1.24)
Chloride: 106 mmol/L (ref 101–111)
GFR calc non Af Amer: >60 mL/min (ref >60)
GLUCOSE: 130 mg/dL — AB (ref 65–99)
Potassium: 3.4 mmol/L — ABNORMAL LOW (ref 3.5–5.1)
SODIUM: 138 mmol/L (ref 135–145)
TOTAL PROTEIN: 6.8 g/dL (ref 6.5–8.1)

## 2017-08-02 LAB — I-STAT CG4 LACTIC ACID, ED: Lactic Acid, Venous: 0.93 mmol/L (ref 0.5–1.9)

## 2017-08-02 MED ORDER — CEPHALEXIN 500 MG PO CAPS: 500.0000 mg | ORAL_CAPSULE | Freq: Three times a day (TID) | ORAL | 0 refills | Status: AC

## 2017-08-02 NOTE — ED Triage Notes (Signed)
Pt here for swelling on right hand and arm onset yest associated w/pain and itching  Sts he felt something sting him but not sure what it was  Denies dyspnea and SOB, throat swelling  A&O x4... NAD.Marland Kitchen. ambulatory

## 2017-08-02 NOTE — ED Notes (Signed)
Pt does not answer when called for room x 3  

## 2017-08-02 NOTE — ED Provider Notes (Addendum)
[facility]    CSN: [id number] Arrival date & time: 08/02/17  1205     History   Chief Complaint Chief Complaint  Patient presents with  . Arm Problem    HPI Austin Gutierrez is a 36 y.o. male.   36 y.o. Male, here for right arm swelling onset yesterday, worse today. Reports itchiness and pain at the site of swelling. Reports that he felt hot and was sweating yesterday, suspecting a possible fever but never checked temperature. He felt that something sting him yesterday but unsure what it was. No breathing difficulty reported.       Past Medical History:  Diagnosis Date  . Schizoaffective disorder [facility])     Patient Active Problem List   Diagnosis Date Noted  . Tobacco use disorder 05/15/2016  . Schizoaffective disorder, bipolar type (HCC) 05/15/2016    History reviewed. No pertinent surgical history.     Home Medications    Prior to Admission medications   Medication Sig Start Date End Date Taking? Authorizing Provider  divalproex (DEPAKOTE) 500 MG DR tablet Take 1 tablet (500 mg total) by mouth every 12 (twelve) hours. 05/21/16  Yes Pucilowska, Jolanta B, MD  haloperidol (HALDOL) 5 MG tablet Take 1 tablet (5 mg total) by mouth at bedtime. 05/21/16  Yes Pucilowska, Jolanta B, MD  cephALEXin (KEFLEX) 500 MG capsule Take 1 capsule (500 mg total) by mouth 3 (three) times daily. 08/02/17 08/07/17  Lucia Estelle, NP  cyclobenzaprine (FLEXERIL) 10 MG tablet Take 1 tablet (10 mg total) by mouth 3 (three) times daily as needed for muscle spasms (or pain). 08/18/16   Trixie Dredge, PA-C  haloperidol decanoate (HALDOL DECANOATE) 100 MG/ML injection Inject 1 mL (100 mg total) into the muscle every 30 (thirty) days. 05/21/16   Shari Prows, MD    Family History Family History  Problem Relation Age of Onset  . Depression Other     Social History Social History  Substance Use Topics  . Smoking status: Heavy Tobacco Smoker    Packs/day: 1.00    Types: Cigarettes    . Smokeless tobacco: Never Used  . Alcohol use 0.6 oz/week    1 Shots of liquor per week     Comment: denies regular alcohol use     Allergies   Mushroom extract complex; Excedrin back & [acetaminophen-aspirin buffered]; Fish allergy; and Onion   Review of Systems Review of Systems  Constitutional:       See HPI     Physical Exam Triage Vital Signs ED Triage Vitals  Enc Vitals Group     BP 08/02/17 1238 116/74     Pulse Rate 08/02/17 1238 75     Resp 08/02/17 1238 20     Temp 08/02/17 1238 98.2 F (36.8 C)     Temp Source 08/02/17 1238 Oral     SpO2 08/02/17 1238 99 %     Weight --      Height --      Head Circumference --      Peak Flow --      Pain Score 08/02/17 1239 4     Pain Loc --      Pain Edu? --      Excl. in GC? --    No data found.   Updated Vital Signs BP 116/74 (BP Location: Left Arm)   Pulse 75   Temp 98.2 F (36.8 C) (Oral)   Resp 20   SpO2 99%   Visual Acuity Right  Eye Distance:   Left Eye Distance:   Bilateral Distance:    Right Eye Near:   Left Eye Near:    Bilateral Near:     Physical Exam  Constitutional: He is oriented to person, place, and time. He appears well-developed and well-nourished.  Cardiovascular: Normal rate, regular rhythm and normal heart sounds.   Pulmonary/Chest: Effort normal and breath sounds normal. He has no wheezes.  Neurological: He is alert and oriented to person, place, and time.  Skin:  Right lower arm is red and swollen, warm to palpate. Sensation and motor intact. Has full ROM  Nursing note and vitals reviewed.    UC Treatments / Results  Labs (all labs ordered are listed, but only abnormal results are displayed) Labs Reviewed - No data to display  EKG  EKG Interpretation None       Radiology No results found.  Procedures Procedures (including critical care time)  Medications Ordered in UC Medications - No data to display   Initial Impression / Assessment and Plan / UC Course   I have reviewed the triage vital signs and the nursing notes.  Pertinent labs & imaging results that were available during my care of the patient were reviewed by me and considered in my medical decision making (see chart for details).  Final Clinical Impressions(s) / UC Diagnoses   Final diagnoses:  Cellulitis of right lower extremity   Clinical presentation concern for cellulitis, most likely secondary to an insect bite. Will treat with keflex TID x 5 days. Take ibuprofen for pain relief. Return if not better.   New Prescriptions New Prescriptions   CEPHALEXIN (KEFLEX) 500 MG CAPSULE    Take 1 capsule (500 mg total) by mouth 3 (three) times daily.     Controlled Substance Prescriptions [place] Controlled Substance Registry consulted? Not Applicable      Lucia Estelle, NP 08/02/17 1319

## 2017-08-02 NOTE — ED Notes (Signed)
Patient presents to ED for assessment of redness, heat and pain to his right arm, specifically redness noted to his right forearm area.  Denies injury, denies bug bites, denies any known source.  Pt c/o chills at home, no fever noted at triage.

## 2017-08-12 DIAGNOSIS — F250 Schizoaffective disorder, bipolar type: Secondary | ICD-10-CM | POA: Diagnosis not present

## 2017-09-18 ENCOUNTER — Emergency Department (HOSPITAL_COMMUNITY)
Admission: EM | Admit: 2017-09-18 | Discharge: 2017-09-18 | Disposition: A | Discharge status: Home / Self Care | Payer: No Typology Code available for payment source | Attending: Emergency Medicine | Admitting: Emergency Medicine

## 2017-09-18 ENCOUNTER — Encounter (HOSPITAL_COMMUNITY): Payer: Self-pay

## 2017-09-18 DIAGNOSIS — M542 Cervicalgia: Secondary | ICD-10-CM | POA: Diagnosis not present

## 2017-09-18 DIAGNOSIS — Y939 Activity, unspecified: Secondary | ICD-10-CM | POA: Diagnosis not present

## 2017-09-18 DIAGNOSIS — Y92410 Unspecified street and highway as the place of occurrence of the external cause: Secondary | ICD-10-CM | POA: Insufficient documentation

## 2017-09-18 DIAGNOSIS — M545 Low back pain, unspecified: Secondary | ICD-10-CM

## 2017-09-18 DIAGNOSIS — F17210 Nicotine dependence, cigarettes, uncomplicated: Secondary | ICD-10-CM | POA: Insufficient documentation

## 2017-09-18 DIAGNOSIS — Z79899 Other long term (current) drug therapy: Secondary | ICD-10-CM | POA: Diagnosis not present

## 2017-09-18 DIAGNOSIS — Y999 Unspecified external cause status: Secondary | ICD-10-CM | POA: Diagnosis not present

## 2017-09-18 MED ORDER — METHOCARBAMOL 500 MG PO TABS: 500.0000 mg | ORAL_TABLET | Freq: Two times a day (BID) | ORAL | 0 refills | Status: DC

## 2017-09-18 MED ORDER — IBUPROFEN 600 MG PO TABS: 600.0000 mg | ORAL_TABLET | Freq: Four times a day (QID) | ORAL | 0 refills | Status: AC | PRN

## 2017-09-18 MED ORDER — IBUPROFEN 400 MG PO TABS
600.0000 mg | ORAL_TABLET | Freq: Once | ORAL | Status: AC
  Administered 2017-09-18: 15:00:00 600 mg via ORAL
  Filled 2017-09-18: qty 1
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
## 2017-09-18 NOTE — Discharge Instructions (Signed)
Use ibuprofen and Robaxin for pain. You can also use heat and ice. Muscle soreness will likely increase over the next 2 days and then start to slowly improve. Return to the ED if symptoms worsen, you develop difficulty walking, numbness weakness or tingling of her extremities or other new or concerning symptoms develop. Otherwise follow-up with your primary care doctor.
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
## 2017-11-08 ENCOUNTER — Emergency Department (HOSPITAL_COMMUNITY)
Admission: EM | Admit: 2017-11-08 | Discharge: 2017-11-08 | Disposition: A | Discharge status: Home / Self Care | Payer: Medicare Other | Attending: Emergency Medicine | Admitting: Emergency Medicine

## 2017-11-08 ENCOUNTER — Other Ambulatory Visit: Payer: Self-pay

## 2017-11-08 ENCOUNTER — Encounter (HOSPITAL_COMMUNITY): Payer: Self-pay | Admitting: *Deleted

## 2017-11-08 DIAGNOSIS — F17210 Nicotine dependence, cigarettes, uncomplicated: Secondary | ICD-10-CM | POA: Insufficient documentation

## 2017-11-08 DIAGNOSIS — J45909 Unspecified asthma, uncomplicated: Secondary | ICD-10-CM | POA: Diagnosis not present

## 2017-11-08 DIAGNOSIS — Z79899 Other long term (current) drug therapy: Secondary | ICD-10-CM | POA: Diagnosis not present

## 2017-11-08 DIAGNOSIS — G43009 Migraine without aura, not intractable, without status migrainosus: Secondary | ICD-10-CM | POA: Insufficient documentation

## 2017-11-08 DIAGNOSIS — R51 Headache: Secondary | ICD-10-CM | POA: Diagnosis present

## 2017-11-08 HISTORY — DX: Unspecified asthma, uncomplicated: J45.909

## 2017-11-08 HISTORY — DX: Migraine without aura, not intractable, without status migrainosus: G43.009

## 2017-11-08 MED ORDER — ONDANSETRON HCL 4 MG/2ML IJ SOLN: 4.0000 mg | Freq: Once | INTRAMUSCULAR | Status: DC

## 2017-11-08 MED ORDER — ONDANSETRON 4 MG PO TBDP
8.0000 mg | ORAL_TABLET | Freq: Once | ORAL | Status: AC
  Administered 2017-11-08: 8 mg via ORAL
  Filled 2017-11-08: qty 2

## 2017-11-08 MED ORDER — DIPHENHYDRAMINE HCL 25 MG PO CAPS
25.0000 mg | ORAL_CAPSULE | Freq: Once | ORAL | Status: AC
Start: 2017-11-08 — End: 2017-11-08
  Administered 2017-11-08: 25 mg via ORAL
  Filled 2017-11-08: qty 1

## 2017-11-08 MED ORDER — KETOROLAC TROMETHAMINE 30 MG/ML IJ SOLN
30.0000 mg | Freq: Once | INTRAMUSCULAR | Status: AC
  Administered 2017-11-08: 30 mg via INTRAMUSCULAR
  Filled 2017-11-08: qty 1

## 2017-11-08 MED ORDER — METOCLOPRAMIDE HCL 10 MG PO TABS
5.0000 mg | ORAL_TABLET | Freq: Once | ORAL | Status: AC
  Administered 2017-11-08: 5 mg via ORAL
  Filled 2017-11-08: qty 1

## 2017-11-08 NOTE — ED Triage Notes (Signed)
The pt is c/o a headache with nausea and vomiting he has a history of the same.  His  Headache started this am early    He woke up with it.  Last headache was 2 days ago

## 2017-11-08 NOTE — ED Provider Notes (Signed)
[facility] Provider Note   CSN: [id number] Arrival date & time: 11/08/17  [id number]     History   Chief Complaint Chief Complaint  Patient presents with  . Migraine    HPI Austin Gutierrez is a 36 y.o. male.  HPI    36 year old male presents today with headache.  Patient reports a long-standing history of same.  He notes migraines since he was a child.  He notes this is frontal, denies any radiation, denies fever, neck stiffness, neurological deficits or any other red flags.  Reports this is identical to previous.  No medications prior to arrival.  No trauma.   Past Medical History:  Diagnosis Date  . Asthma   . Headache, common migraine   . Schizoaffective disorder [facility](HCC)     Patient Active Problem List   Diagnosis Date Noted  . Tobacco use disorder 05/15/2016  . Schizoaffective disorder, bipolar type (HCC) 05/15/2016    Past Surgical History:  Procedure Laterality Date  . MOUTH SURGERY         Home Medications    Prior to Admission medications   Medication Sig Start Date End Date Taking? Authorizing Provider  benztropine (COGENTIN) 1 MG tablet Take 1 mg by mouth at bedtime. 08/12/17  Yes [provider]  divalproex (DEPAKOTE ER) 500 MG 24 hr tablet Take 500 mg by mouth at bedtime. 08/12/17  Yes [provider]  haloperidol (HALDOL) 5 MG tablet Take 1 tablet (5 mg total) by mouth at bedtime. Patient taking differently: Take 10 mg by mouth at bedtime.  05/21/16  Yes Pucilowska, Jolanta B, MD  mirtazapine (REMERON) 15 MG tablet Take 15 mg by mouth at bedtime. 08/12/17  Yes [provider]  cyclobenzaprine (FLEXERIL) 10 MG tablet Take 1 tablet (10 mg total) by mouth 3 (three) times daily as needed for muscle spasms (or pain). Patient not taking: Reported on 11/08/2017 08/18/16   Trixie DredgeWest, Emily, PA-C  divalproex (DEPAKOTE) 500 MG DR tablet Take 1 tablet (500 mg total) by mouth every 12 (twelve) hours. Patient  not taking: Reported on 11/08/2017 05/21/16   Pucilowska, Braulio ConteJolanta B, MD  haloperidol decanoate (HALDOL DECANOATE) 100 MG/ML injection Inject 1 mL (100 mg total) into the muscle every 30 (thirty) days. Patient not taking: Reported on 11/08/2017 05/21/16   Pucilowska, Braulio ConteJolanta B, MD  ibuprofen (ADVIL,MOTRIN) 600 MG tablet Take 1 tablet (600 mg total) by mouth every 6 (six) hours as needed. Patient not taking: Reported on 11/08/2017 09/18/17   Dartha LodgeFord, Kelsey N, PA-C  methocarbamol (ROBAXIN) 500 MG tablet Take 1 tablet (500 mg total) by mouth 2 (two) times daily. Patient not taking: Reported on 11/08/2017 09/18/17   Dartha LodgeFord, Kelsey N, PA-C    Family History Family History  Problem Relation Age of Onset  . Depression Other     Social History Social History   Tobacco Use  . Smoking status: Heavy Tobacco Smoker    Packs/day: 1.00    Types: Cigarettes  . Smokeless tobacco: Never Used  Substance Use Topics  . Alcohol use: Yes    Alcohol/week: 0.6 oz    Types: 1 Shots of liquor per week    Comment: denies regular alcohol use  . Drug use: Yes    Types: Marijuana     Allergies   Mushroom extract complex; Excedrin back & [acetaminophen-aspirin buffered]; Fish allergy; and Onion   Review of Systems Review of Systems  All other systems reviewed and are negative.    Physical  Exam Updated Vital Signs BP (!) 154/104   Pulse 71   Temp 98.1 F (36.7 C) (Oral)   Resp 16   Ht 6\' 2"  (1.88 m)   Wt 79.4 kg (175 lb)   SpO2 98%   BMI 22.47 kg/m   Physical Exam  Constitutional: He is oriented to person, place, and time. He appears well-developed and well-nourished. No distress.  HENT:  Head: Normocephalic.  Eyes: Conjunctivae and EOM are normal. Pupils are equal, round, and reactive to light. Right eye exhibits no discharge. Left eye exhibits no discharge. No scleral icterus.  Neck: Normal range of motion. Neck supple. No JVD present.  Pulmonary/Chest: No stridor.  Musculoskeletal: Normal  range of motion. He exhibits no edema or tenderness.  Lymphadenopathy:    He has no cervical adenopathy.  Neurological: He is alert and oriented to person, place, and time. He has normal strength. He displays no atrophy and no tremor. No cranial nerve deficit or sensory deficit. He exhibits normal muscle tone. He displays a negative Romberg sign. He displays no seizure activity. Coordination and gait normal. GCS eye subscore is 4. GCS verbal subscore is 5. GCS motor subscore is 6.  Reflex Scores:      Patellar reflexes are 2+ on the right side and 2+ on the left side. Skin: He is not diaphoretic.  Nursing note and vitals reviewed.    ED Treatments / Results  Labs (all labs ordered are listed, but only abnormal results are displayed) Labs Reviewed - No data to display  EKG  EKG Interpretation None       Radiology No results found.  Procedures Procedures (including critical care time)  Medications Ordered in ED Medications  ondansetron (ZOFRAN-ODT) disintegrating tablet 8 mg (8 mg Oral Given 11/08/17 0701)  ketorolac (TORADOL) 30 MG/ML injection 30 mg (30 mg Intramuscular Given 11/08/17 0843)  metoCLOPramide (REGLAN) tablet 5 mg (5 mg Oral Given 11/08/17 0842)  diphenhydrAMINE (BENADRYL) capsule 25 mg (25 mg Oral Given 11/08/17 0843)     Initial Impression / Assessment and Plan / ED Course  I have reviewed the triage vital signs and the nursing notes.  Pertinent labs & imaging results that were available during my care of the patient were reviewed by me and considered in my medical decision making (see chart for details).      Final Clinical Impressions(s) / ED Diagnoses   Final diagnoses:  Migraine without aura and without status migrainosus, not intractable    36 year old male presents today with headache.  No complicating features, no red flags.  Completely resolved with Toradol, Reglan, Benadryl.  Patient referred to neurology as he has ongoing headaches, strict  return precautions given.  He verbalized understanding and agreement to today's plan.  ED Discharge Orders    None       Eyvonne MechanicHedges, Rayshawn Visconti, Cordelia Poche-C 11/08/17 [id number]    Raeford RazorKohut, Stephen, MD 11/08/17 [phone]

## 2017-11-08 NOTE — ED Notes (Signed)
Nausea no vomiting   He has tried to vomit but cannot

## 2017-11-08 NOTE — ED Triage Notes (Signed)
The pt drove himself here this am

## 2017-11-08 NOTE — Discharge Instructions (Signed)
Please read attached information. If you experience any new or worsening signs or symptoms please return to the emergency room for evaluation. Please follow-up with your primary care provider or specialist as discussed.  °

## 2017-11-13 DIAGNOSIS — F250 Schizoaffective disorder, bipolar type: Secondary | ICD-10-CM | POA: Diagnosis not present

## 2017-11-18 ENCOUNTER — Ambulatory Visit: Payer: Medicare Other | Admitting: Neurology

## 2018-01-14 ENCOUNTER — Ambulatory Visit: Payer: Medicare Other | Admitting: Neurology

## 2018-01-14 ENCOUNTER — Telehealth: Payer: Self-pay | Admitting: *Deleted

## 2018-01-14 NOTE — Telephone Encounter (Signed)
Pt no show new pt appt on 01/14/2018 @ 2:30.

## 2018-01-15 ENCOUNTER — Encounter: Payer: Self-pay | Admitting: Neurology

## 2018-02-05 DIAGNOSIS — F250 Schizoaffective disorder, bipolar type: Secondary | ICD-10-CM | POA: Diagnosis not present

## 2018-03-01 ENCOUNTER — Emergency Department (HOSPITAL_COMMUNITY): Payer: Medicare Other

## 2018-03-01 ENCOUNTER — Emergency Department (HOSPITAL_COMMUNITY)
Admission: EM | Admit: 2018-03-01 | Discharge: 2018-03-01 | Disposition: A | Discharge status: Home / Self Care | Payer: Medicare Other | Attending: Emergency Medicine | Admitting: Emergency Medicine

## 2018-03-01 ENCOUNTER — Encounter (HOSPITAL_COMMUNITY): Payer: Self-pay | Admitting: Emergency Medicine

## 2018-03-01 DIAGNOSIS — Y999 Unspecified external cause status: Secondary | ICD-10-CM | POA: Diagnosis not present

## 2018-03-01 DIAGNOSIS — Y929 Unspecified place or not applicable: Secondary | ICD-10-CM | POA: Diagnosis not present

## 2018-03-01 DIAGNOSIS — Y9389 Activity, other specified: Secondary | ICD-10-CM | POA: Diagnosis not present

## 2018-03-01 DIAGNOSIS — R1032 Left lower quadrant pain: Secondary | ICD-10-CM | POA: Diagnosis not present

## 2018-03-01 DIAGNOSIS — R0781 Pleurodynia: Secondary | ICD-10-CM | POA: Diagnosis not present

## 2018-03-01 DIAGNOSIS — F17210 Nicotine dependence, cigarettes, uncomplicated: Secondary | ICD-10-CM | POA: Insufficient documentation

## 2018-03-01 DIAGNOSIS — J45909 Unspecified asthma, uncomplicated: Secondary | ICD-10-CM | POA: Diagnosis not present

## 2018-03-01 DIAGNOSIS — R51 Headache: Secondary | ICD-10-CM | POA: Insufficient documentation

## 2018-03-01 DIAGNOSIS — S50311A Abrasion of right elbow, initial encounter: Secondary | ICD-10-CM | POA: Diagnosis not present

## 2018-03-01 DIAGNOSIS — Z23 Encounter for immunization: Secondary | ICD-10-CM | POA: Diagnosis not present

## 2018-03-01 DIAGNOSIS — T07XXXA Unspecified multiple injuries, initial encounter: Secondary | ICD-10-CM

## 2018-03-01 DIAGNOSIS — Z79899 Other long term (current) drug therapy: Secondary | ICD-10-CM | POA: Insufficient documentation

## 2018-03-01 DIAGNOSIS — S5001XA Contusion of right elbow, initial encounter: Secondary | ICD-10-CM | POA: Diagnosis not present

## 2018-03-01 DIAGNOSIS — T148XXA Other injury of unspecified body region, initial encounter: Secondary | ICD-10-CM | POA: Diagnosis not present

## 2018-03-01 DIAGNOSIS — S299XXA Unspecified injury of thorax, initial encounter: Secondary | ICD-10-CM | POA: Diagnosis not present

## 2018-03-01 DIAGNOSIS — S0990XA Unspecified injury of head, initial encounter: Secondary | ICD-10-CM | POA: Diagnosis not present

## 2018-03-01 DIAGNOSIS — S40811A Abrasion of right upper arm, initial encounter: Secondary | ICD-10-CM | POA: Diagnosis not present

## 2018-03-01 DIAGNOSIS — R1012 Left upper quadrant pain: Secondary | ICD-10-CM | POA: Insufficient documentation

## 2018-03-01 DIAGNOSIS — S20212A Contusion of left front wall of thorax, initial encounter: Secondary | ICD-10-CM | POA: Diagnosis not present

## 2018-03-01 MED ORDER — TRAMADOL HCL 50 MG PO TABS: 50.0000 mg | ORAL_TABLET | Freq: Four times a day (QID) | ORAL | 0 refills | Status: DC | PRN

## 2018-03-01 MED ORDER — OXYCODONE-ACETAMINOPHEN 5-325 MG PO TABS
1.0000 | ORAL_TABLET | Freq: Once | ORAL | Status: AC
  Administered 2018-03-01: 1 via ORAL
  Filled 2018-03-01: qty 1

## 2018-03-01 MED ORDER — HYDROMORPHONE HCL 1 MG/ML IJ SOLN
0.5000 mg | Freq: Once | INTRAMUSCULAR | Status: AC
  Administered 2018-03-01: 0.5 mg via INTRAMUSCULAR
  Filled 2018-03-01: qty 1

## 2018-03-01 MED ORDER — TETANUS-DIPHTH-ACELL PERTUSSIS 5-2.5-18.5 LF-MCG/0.5 IM SUSP
0.5000 mL | Freq: Once | INTRAMUSCULAR | Status: AC
  Administered 2018-03-01: 0.5 mL via INTRAMUSCULAR
  Filled 2018-03-01: qty 0.5

## 2018-03-01 MED ORDER — NAPROXEN 375 MG PO TABS: 375.0000 mg | ORAL_TABLET | Freq: Two times a day (BID) | ORAL | 0 refills | Status: DC | PRN

## 2018-03-01 MED ORDER — KETOROLAC TROMETHAMINE 30 MG/ML IJ SOLN
30.0000 mg | Freq: Once | INTRAMUSCULAR | Status: AC
  Administered 2018-03-01: 30 mg via INTRAMUSCULAR
  Filled 2018-03-01: qty 1

## 2018-03-01 MED ORDER — LORAZEPAM 1 MG PO TABS
0.5000 mg | ORAL_TABLET | Freq: Once | ORAL | Status: AC
  Administered 2018-03-01: 0.5 mg via ORAL
  Filled 2018-03-01: qty 1
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
## 2018-03-01 NOTE — ED Notes (Signed)
Pt's family reports pt is coughing up phlegm, with a bit of blood in the phlegm.
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
## 2018-03-17 DIAGNOSIS — F250 Schizoaffective disorder, bipolar type: Secondary | ICD-10-CM | POA: Diagnosis not present

## 2018-05-14 ENCOUNTER — Ambulatory Visit (HOSPITAL_COMMUNITY)
Admission: EM | Admit: 2018-05-14 | Discharge: 2018-05-14 | Disposition: A | Discharge status: Home / Self Care | Payer: Medicare Other | Attending: Family Medicine | Admitting: Family Medicine

## 2018-05-14 ENCOUNTER — Encounter (HOSPITAL_COMMUNITY): Payer: Self-pay | Admitting: Family Medicine

## 2018-05-14 DIAGNOSIS — F17210 Nicotine dependence, cigarettes, uncomplicated: Secondary | ICD-10-CM | POA: Insufficient documentation

## 2018-05-14 DIAGNOSIS — Z79899 Other long term (current) drug therapy: Secondary | ICD-10-CM | POA: Insufficient documentation

## 2018-05-14 DIAGNOSIS — F250 Schizoaffective disorder, bipolar type: Secondary | ICD-10-CM | POA: Diagnosis not present

## 2018-05-14 DIAGNOSIS — Z202 Contact with and (suspected) exposure to infections with a predominantly sexual mode of transmission: Secondary | ICD-10-CM | POA: Diagnosis not present

## 2018-05-14 DIAGNOSIS — Z113 Encounter for screening for infections with a predominantly sexual mode of transmission: Secondary | ICD-10-CM

## 2018-05-14 DIAGNOSIS — Z711 Person with feared health complaint in whom no diagnosis is made: Secondary | ICD-10-CM

## 2018-05-14 NOTE — ED Provider Notes (Signed)
[facility]    CSN: [id number] Arrival date & time: 05/14/18  1643     History   Chief Complaint Chief Complaint  Patient presents with  . Exposure to STD    HPI Bond Grieshop is a 37 y.o. male.   37 year old male comes in for STD check.  States he got a phone call about STD results, which made him paranoid and wants to be checked.  He recently switched partners.  Otherwise asymptomatic.  Denies nausea, vomiting, abdominal pain.  Denies fever, chills, night sweats.  Denies urinary symptoms such as frequency, dysuria, hematuria.  Denies penile discharge, penile lesion, testicular swelling, testicular pain.     Past Medical History:  Diagnosis Date  . Asthma   . Headache, common migraine   . Schizoaffective disorder [facility])     Patient Active Problem List   Diagnosis Date Noted  . Tobacco use disorder 05/15/2016  . Schizoaffective disorder, bipolar type (HCC) 05/15/2016    Past Surgical History:  Procedure Laterality Date  . MOUTH SURGERY         Home Medications    Prior to Admission medications   Medication Sig Start Date End Date Taking? Authorizing Provider  benztropine (COGENTIN) 1 MG tablet Take 1 mg by mouth at bedtime. 08/12/17   [provider]  cyclobenzaprine (FLEXERIL) 10 MG tablet Take 1 tablet (10 mg total) by mouth 3 (three) times daily as needed for muscle spasms (or pain). Patient not taking: Reported on 11/08/2017 08/18/16   Trixie Dredge, PA-C  divalproex (DEPAKOTE ER) 500 MG 24 hr tablet Take 500 mg by mouth at bedtime. 08/12/17   [provider]  divalproex (DEPAKOTE) 500 MG DR tablet Take 1 tablet (500 mg total) by mouth every 12 (twelve) hours. Patient not taking: Reported on 11/08/2017 05/21/16   Pucilowska, Braulio Conte B, MD  haloperidol (HALDOL) 5 MG tablet Take 1 tablet (5 mg total) by mouth at bedtime. Patient taking differently: Take 10 mg by mouth at bedtime.  05/21/16   Pucilowska, Jolanta B, MD  haloperidol  decanoate (HALDOL DECANOATE) 100 MG/ML injection Inject 1 mL (100 mg total) into the muscle every 30 (thirty) days. Patient not taking: Reported on 11/08/2017 05/21/16   Pucilowska, Braulio Conte B, MD  ibuprofen (ADVIL,MOTRIN) 600 MG tablet Take 1 tablet (600 mg total) by mouth every 6 (six) hours as needed. Patient not taking: Reported on 11/08/2017 09/18/17   Dartha Lodge, PA-C  methocarbamol (ROBAXIN) 500 MG tablet Take 1 tablet (500 mg total) by mouth 2 (two) times daily. Patient not taking: Reported on 11/08/2017 09/18/17   Dartha Lodge, PA-C  mirtazapine (REMERON) 15 MG tablet Take 15 mg by mouth at bedtime. 08/12/17   [provider]  naproxen (NAPROSYN) 375 MG tablet Take 1 tablet (375 mg total) by mouth 2 (two) times daily as needed. 03/01/18   Raeford Razor, MD  traMADol (ULTRAM) 50 MG tablet Take 1 tablet (50 mg total) by mouth every 6 (six) hours as needed. 03/01/18   Raeford Razor, MD    Family History Family History  Problem Relation Age of Onset  . Depression Other     Social History Social History   Tobacco Use  . Smoking status: Heavy Tobacco Smoker    Packs/day: 1.00    Types: Cigarettes  . Smokeless tobacco: Never Used  Substance Use Topics  . Alcohol use: Yes    Alcohol/week: 0.6 oz    Types: 1 Shots of liquor per week  Comment: denies regular alcohol use  . Drug use: Yes    Types: Marijuana     Allergies   Mushroom extract complex; Excedrin back & [acetaminophen-aspirin buffered]; Fish allergy; and Onion   Review of Systems Review of Systems  Reason unable to perform ROS: See HPI as above.     Physical Exam Triage Vital Signs ED Triage Vitals  Enc Vitals Group     BP 05/14/18 1708 (!) 159/79     Pulse Rate 05/14/18 1708 68     Resp 05/14/18 1708 18     Temp 05/14/18 1708 98.6 F (37 C)     Temp src --      SpO2 05/14/18 1708 100 %     Weight --      Height --      Head Circumference --      Peak Flow --      Pain Score 05/14/18  1707 0     Pain Loc --      Pain Edu? --      Excl. in GC? --    No data found.  Updated Vital Signs BP (!) 159/79   Pulse 68   Temp 98.6 F (37 C)   Resp 18   SpO2 100%   Visual Acuity Right Eye Distance:   Left Eye Distance:   Bilateral Distance:    Right Eye Near:   Left Eye Near:    Bilateral Near:     Physical Exam  Constitutional: He is oriented to person, place, and time. He appears well-developed and well-nourished. No distress.  HENT:  Head: Normocephalic and atraumatic.  Eyes: Pupils are equal, round, and reactive to light. Conjunctivae are normal.  Neurological: He is alert and oriented to person, place, and time.     UC Treatments / Results  Labs (all labs ordered are listed, but only abnormal results are displayed) Labs Reviewed  URINE CYTOLOGY ANCILLARY ONLY    EKG None  Radiology No results found.  Procedures Procedures (including critical care time)  Medications Ordered in UC Medications - No data to display  Initial Impression / Assessment and Plan / UC Course  I have reviewed the triage vital signs and the nursing notes.  Pertinent labs & imaging results that were available during my care of the patient were reviewed by me and considered in my medical decision making (see chart for details).    Cytology sent, patient will be contacted with any positive results that require additional treatment. Patient to refrain from sexual activity for the next 7 days. Return precautions given.   Final Clinical Impressions(s) / UC Diagnoses   Final diagnoses:  Concern about STD in male without diagnosis    ED Prescriptions    None        Belinda Fisher, PA-C 05/14/18 1807

## 2018-05-14 NOTE — ED Triage Notes (Signed)
Pt here for STD check. Denies symptoms.  

## 2018-05-14 NOTE — Discharge Instructions (Signed)
Cytology sent, you will be contacted with any positive results that requires further treatment. Refrain from sexual activity and alcohol use for the next 7 days. Monitor for any worsening of symptoms, fever, abdominal pain, nausea, vomiting, testicular swelling/pain, penile lesion/ulcer/sore to follow up for reevaluation.

## 2018-05-15 LAB — URINE CYTOLOGY ANCILLARY ONLY
Chlamydia: NEGATIVE
Neisseria Gonorrhea: NEGATIVE
TRICH (WINDOWPATH): NEGATIVE

## 2018-06-09 DIAGNOSIS — F250 Schizoaffective disorder, bipolar type: Secondary | ICD-10-CM | POA: Diagnosis not present

## 2018-08-31 DIAGNOSIS — F250 Schizoaffective disorder, bipolar type: Secondary | ICD-10-CM | POA: Diagnosis not present

## 2018-11-10 DIAGNOSIS — F250 Schizoaffective disorder, bipolar type: Secondary | ICD-10-CM | POA: Diagnosis not present

## 2019-02-18 DIAGNOSIS — F4310 Post-traumatic stress disorder, unspecified: Secondary | ICD-10-CM | POA: Diagnosis not present

## 2019-02-18 DIAGNOSIS — F410 Panic disorder [episodic paroxysmal anxiety] without agoraphobia: Secondary | ICD-10-CM | POA: Diagnosis not present

## 2019-02-18 DIAGNOSIS — F250 Schizoaffective disorder, bipolar type: Secondary | ICD-10-CM | POA: Diagnosis not present

## 2019-02-18 DIAGNOSIS — F411 Generalized anxiety disorder: Secondary | ICD-10-CM | POA: Diagnosis not present

## 2019-05-31 DIAGNOSIS — F4310 Post-traumatic stress disorder, unspecified: Secondary | ICD-10-CM | POA: Diagnosis not present

## 2019-05-31 DIAGNOSIS — F411 Generalized anxiety disorder: Secondary | ICD-10-CM | POA: Diagnosis not present

## 2019-05-31 DIAGNOSIS — F250 Schizoaffective disorder, bipolar type: Secondary | ICD-10-CM | POA: Diagnosis not present

## 2019-05-31 DIAGNOSIS — F410 Panic disorder [episodic paroxysmal anxiety] without agoraphobia: Secondary | ICD-10-CM | POA: Diagnosis not present

## 2019-08-30 DIAGNOSIS — F250 Schizoaffective disorder, bipolar type: Secondary | ICD-10-CM | POA: Diagnosis not present

## 2019-08-30 DIAGNOSIS — F4310 Post-traumatic stress disorder, unspecified: Secondary | ICD-10-CM | POA: Diagnosis not present

## 2019-08-30 DIAGNOSIS — F410 Panic disorder [episodic paroxysmal anxiety] without agoraphobia: Secondary | ICD-10-CM | POA: Diagnosis not present

## 2019-08-30 DIAGNOSIS — F411 Generalized anxiety disorder: Secondary | ICD-10-CM | POA: Diagnosis not present

## 2019-11-18 DIAGNOSIS — F17200 Nicotine dependence, unspecified, uncomplicated: Secondary | ICD-10-CM | POA: Diagnosis not present

## 2019-11-18 DIAGNOSIS — F411 Generalized anxiety disorder: Secondary | ICD-10-CM | POA: Diagnosis not present

## 2019-11-18 DIAGNOSIS — F4310 Post-traumatic stress disorder, unspecified: Secondary | ICD-10-CM | POA: Diagnosis not present

## 2019-11-18 DIAGNOSIS — F410 Panic disorder [episodic paroxysmal anxiety] without agoraphobia: Secondary | ICD-10-CM | POA: Diagnosis not present

## 2019-11-18 DIAGNOSIS — F250 Schizoaffective disorder, bipolar type: Secondary | ICD-10-CM | POA: Diagnosis not present

## 2020-02-10 DIAGNOSIS — F411 Generalized anxiety disorder: Secondary | ICD-10-CM | POA: Diagnosis not present

## 2020-02-10 DIAGNOSIS — F250 Schizoaffective disorder, bipolar type: Secondary | ICD-10-CM | POA: Diagnosis not present

## 2020-02-10 DIAGNOSIS — F410 Panic disorder [episodic paroxysmal anxiety] without agoraphobia: Secondary | ICD-10-CM | POA: Diagnosis not present

## 2020-02-10 DIAGNOSIS — F4310 Post-traumatic stress disorder, unspecified: Secondary | ICD-10-CM | POA: Diagnosis not present

## 2020-02-10 DIAGNOSIS — F17200 Nicotine dependence, unspecified, uncomplicated: Secondary | ICD-10-CM | POA: Diagnosis not present

## 2020-02-21 ENCOUNTER — Emergency Department (HOSPITAL_COMMUNITY)
Admission: EM | Admit: 2020-02-21 | Discharge: 2020-02-22 | Disposition: A | Discharge status: Other Acute Inpatient Hospital | Payer: Medicare Other | Attending: Emergency Medicine | Admitting: Emergency Medicine

## 2020-02-21 ENCOUNTER — Encounter (HOSPITAL_COMMUNITY): Payer: Self-pay | Admitting: Family Medicine

## 2020-02-21 DIAGNOSIS — F258 Other schizoaffective disorders: Secondary | ICD-10-CM

## 2020-02-21 DIAGNOSIS — F17210 Nicotine dependence, cigarettes, uncomplicated: Secondary | ICD-10-CM | POA: Insufficient documentation

## 2020-02-21 DIAGNOSIS — Z03818 Encounter for observation for suspected exposure to other biological agents ruled out: Secondary | ICD-10-CM | POA: Diagnosis not present

## 2020-02-21 DIAGNOSIS — Z20822 Contact with and (suspected) exposure to covid-19: Secondary | ICD-10-CM | POA: Insufficient documentation

## 2020-02-21 DIAGNOSIS — Z79899 Other long term (current) drug therapy: Secondary | ICD-10-CM | POA: Insufficient documentation

## 2020-02-21 DIAGNOSIS — F259 Schizoaffective disorder, unspecified: Secondary | ICD-10-CM | POA: Insufficient documentation

## 2020-02-21 DIAGNOSIS — J45909 Unspecified asthma, uncomplicated: Secondary | ICD-10-CM | POA: Insufficient documentation

## 2020-02-21 NOTE — ED Notes (Signed)
Pt refused blood draw saying that he already had blood taken and it was "worser" than this.  He states he is here for the COVID vaccine or to get swabbed for COVID.
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
## 2020-02-21 NOTE — ED Notes (Signed)
Pt is not willing to change into scrubs. He reports needing to get out of here to get his [employer] 300/hellcat so that he can get back to the future. When mentioning his mother, [name] who filled out the IVC paperwork, he said that she is a hologram and is not real.

## 2020-02-22 ENCOUNTER — Encounter (HOSPITAL_COMMUNITY): Payer: Self-pay | Admitting: Registered Nurse

## 2020-02-22 ENCOUNTER — Other Ambulatory Visit: Payer: Self-pay

## 2020-02-22 ENCOUNTER — Encounter (HOSPITAL_COMMUNITY): Payer: Self-pay | Admitting: Psychiatry

## 2020-02-22 ENCOUNTER — Inpatient Hospital Stay (HOSPITAL_COMMUNITY)
Admission: AD | Admit: 2020-02-22 | Discharge: 2020-02-28 | DRG: 885 | Disposition: A | Discharge status: Home / Self Care | Payer: Medicare Other | Source: Intra-hospital | Attending: Psychiatry | Admitting: Psychiatry

## 2020-02-22 DIAGNOSIS — Z886 Allergy status to analgesic agent status: Secondary | ICD-10-CM

## 2020-02-22 DIAGNOSIS — G4700 Insomnia, unspecified: Secondary | ICD-10-CM | POA: Diagnosis present

## 2020-02-22 DIAGNOSIS — Z9119 Patient's noncompliance with other medical treatment and regimen: Secondary | ICD-10-CM

## 2020-02-22 DIAGNOSIS — Z818 Family history of other mental and behavioral disorders: Secondary | ICD-10-CM

## 2020-02-22 DIAGNOSIS — E860 Dehydration: Secondary | ICD-10-CM | POA: Diagnosis present

## 2020-02-22 DIAGNOSIS — Z9114 Patient's other noncompliance with medication regimen: Secondary | ICD-10-CM

## 2020-02-22 DIAGNOSIS — Z79899 Other long term (current) drug therapy: Secondary | ICD-10-CM

## 2020-02-22 DIAGNOSIS — F1220 Cannabis dependence, uncomplicated: Secondary | ICD-10-CM | POA: Diagnosis present

## 2020-02-22 DIAGNOSIS — F259 Schizoaffective disorder, unspecified: Principal | ICD-10-CM | POA: Diagnosis present

## 2020-02-22 DIAGNOSIS — I951 Orthostatic hypotension: Secondary | ICD-10-CM | POA: Diagnosis present

## 2020-02-22 DIAGNOSIS — Z781 Physical restraint status: Secondary | ICD-10-CM | POA: Diagnosis not present

## 2020-02-22 DIAGNOSIS — R748 Abnormal levels of other serum enzymes: Secondary | ICD-10-CM | POA: Diagnosis present

## 2020-02-22 DIAGNOSIS — Z91013 Allergy to seafood: Secondary | ICD-10-CM

## 2020-02-22 DIAGNOSIS — F17210 Nicotine dependence, cigarettes, uncomplicated: Secondary | ICD-10-CM | POA: Diagnosis present

## 2020-02-22 DIAGNOSIS — Z91018 Allergy to other foods: Secondary | ICD-10-CM

## 2020-02-22 DIAGNOSIS — Z791 Long term (current) use of non-steroidal anti-inflammatories (NSAID): Secondary | ICD-10-CM | POA: Diagnosis not present

## 2020-02-22 DIAGNOSIS — F250 Schizoaffective disorder, bipolar type: Secondary | ICD-10-CM | POA: Diagnosis not present

## 2020-02-22 LAB — COMPREHENSIVE METABOLIC PANEL
ALT: 65 U/L — ABNORMAL HIGH (ref 0–44)
AST: 209 U/L — ABNORMAL HIGH (ref 15–41)
Albumin: 4 g/dL (ref 3.5–5.0)
Alkaline Phosphatase: 31 U/L — ABNORMAL LOW (ref 38–126)
Anion gap: 9 (ref 5–15)
BUN: 25 mg/dL — ABNORMAL HIGH (ref 6–20)
CO2: 24 mmol/L (ref 22–32)
Calcium: 9.1 mg/dL (ref 8.9–10.3)
Chloride: 107 mmol/L (ref 98–111)
Creatinine, Ser: 1.49 mg/dL — ABNORMAL HIGH (ref 0.61–1.24)
GFR calc Af Amer: >60 mL/min (ref >60)
GFR calc non Af Amer: 58 mL/min — ABNORMAL LOW (ref >60)
Glucose, Bld: 102 mg/dL — ABNORMAL HIGH (ref 70–99)
Potassium: 3.4 mmol/L — ABNORMAL LOW (ref 3.5–5.1)
Sodium: 140 mmol/L (ref 135–145)
Total Bilirubin: 1.2 mg/dL (ref 0.3–1.2)
Total Protein: 6.8 g/dL (ref 6.5–8.1)

## 2020-02-22 LAB — CBC
HCT: 40.8 % (ref 39.0–52.0)
Hemoglobin: 13.9 g/dL (ref 13.0–17.0)
MCH: 30.7 pg (ref 26.0–34.0)
MCHC: 34.1 g/dL (ref 30.0–36.0)
MCV: 90.1 fL (ref 80.0–100.0)
Platelets: 279 10*3/uL (ref 150–400)
RBC: 4.53 MIL/uL (ref 4.22–5.81)
RDW: 12.5 % (ref 11.5–15.5)
WBC: 8.8 10*3/uL (ref 4.0–10.5)
nRBC: 0 % (ref 0.0–0.2)

## 2020-02-22 LAB — RAPID URINE DRUG SCREEN, HOSP PERFORMED
Amphetamines: NOT DETECTED
Barbiturates: NOT DETECTED
Benzodiazepines: NOT DETECTED
Cocaine: NOT DETECTED
Opiates: NOT DETECTED
Tetrahydrocannabinol: POSITIVE — AB

## 2020-02-22 LAB — SALICYLATE LEVEL: Salicylate Lvl: <7 mg/dL — ABNORMAL LOW (ref 7.0–30.0)

## 2020-02-22 LAB — ACETAMINOPHEN LEVEL: Acetaminophen (Tylenol), Serum: <10 ug/mL — ABNORMAL LOW (ref 10–30)

## 2020-02-22 LAB — RESPIRATORY PANEL BY RT PCR (FLU A&B, COVID)
Influenza A by PCR: NEGATIVE
Influenza B by PCR: NEGATIVE
SARS Coronavirus 2 by RT PCR: NEGATIVE

## 2020-02-22 LAB — ETHANOL: Alcohol, Ethyl (B): <10 mg/dL (ref <10)

## 2020-02-22 MED ORDER — HYDROXYZINE HCL 25 MG PO TABS
25.0000 mg | ORAL_TABLET | Freq: Three times a day (TID) | ORAL | Status: DC | PRN
  Administered 2020-02-23 (×2): 25 mg via ORAL
  Filled 2020-02-22 (×2): qty 1

## 2020-02-22 MED ORDER — HALOPERIDOL 5 MG PO TABS
5.0000 mg | ORAL_TABLET | Freq: Every day | ORAL | Status: DC
  Filled 2020-02-22 (×2): qty 1

## 2020-02-22 MED ORDER — DIVALPROEX SODIUM 500 MG PO DR TAB
500.0000 mg | DELAYED_RELEASE_TABLET | Freq: Two times a day (BID) | ORAL | Status: DC
  Filled 2020-02-22 (×2): qty 1

## 2020-02-22 MED ORDER — ZIPRASIDONE MESYLATE 20 MG IM SOLR
20.0000 mg | Freq: Once | INTRAMUSCULAR | Status: AC
  Administered 2020-02-22: 20 mg via INTRAMUSCULAR
  Filled 2020-02-22: qty 20

## 2020-02-22 MED ORDER — STERILE WATER FOR INJECTION IJ SOLN
INTRAMUSCULAR | Status: AC
  Filled 2020-02-22: qty 10

## 2020-02-22 MED ORDER — TRAZODONE HCL 50 MG PO TABS
50.0000 mg | ORAL_TABLET | Freq: Every evening | ORAL | Status: DC | PRN
  Administered 2020-02-23: 50 mg via ORAL
  Filled 2020-02-22: qty 1

## 2020-02-22 MED ORDER — HALOPERIDOL 5 MG PO TABS
5.0000 mg | ORAL_TABLET | Freq: Every day | ORAL | Status: DC
  Administered 2020-02-22: 5 mg via ORAL
  Filled 2020-02-22 (×3): qty 1

## 2020-02-22 MED ORDER — DIVALPROEX SODIUM 500 MG PO DR TAB
500.0000 mg | DELAYED_RELEASE_TABLET | Freq: Two times a day (BID) | ORAL | Status: DC
  Administered 2020-02-22 – 2020-02-25 (×6): 500 mg via ORAL
  Filled 2020-02-22 (×9): qty 1

## 2020-02-22 NOTE — ED Provider Notes (Signed)
Emergency Medicine Observation Re-evaluation Note  Austin Gutierrez is a 39 y.o. male, seen on rounds today.  Pt initially presented to the ED for complaints of IVC Currently, the patient is stable.  Physical Exam  BP (!) 172/69 (BP Location: Left Arm)   Pulse (!) 109   Temp 98.1 F (36.7 C) (Oral)   Resp 16   Ht 1.88 m (6\' 2" )   Wt 68 kg   SpO2 98%   BMI 19.26 kg/m  Physical Exam Resting comfortably ED Course / MDM  EKG:    I have reviewed the labs performed to date as well as medications administered while in observation.  Recent changes in the last 24 hours include TTS assessment. Plan  Current plan is for inpatient treatment. Patient is under full IVC at this time.   , MD 02/22/20 [phone]

## 2020-02-22 NOTE — BH Assessment (Signed)
[facility] Assessment Progress Note  Per Shuvon Rankin, FNP, this pt requires psychiatric hospitalization.  Jasmine has assigned pt to [facility]; [facility] will be ready to receive pt after 17:00.  Pt presents under IVC initiated by pt's mother, and upheld by EDP Drema Pry, MD, and IVC documents have been faxed to [facility].  Pt's nurse, Diane, has been notified, and agrees to call report to [phone].  Pt is to be transported via Patent [employer].   Doylene Canning, [place] Behavioral Health Coordinator [phone]
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
## 2020-02-22 NOTE — ED Notes (Signed)
PT becoming increasingly hostile toward staff, aggressive body language and stances. Making comments such as "im going to leave, you cant hold me here." When trying to dress out the pt he became non-compliant, and tried to resist staff. It was determined for staff and pts safety it was best to restrain the pt with soft restraints. Provider noticed.

## 2020-02-22 NOTE — ED Notes (Signed)
Pt is refusing to take oral medications at this time. Pt also moving to much to get accurate EKG at this time. Will wait until pt becomes more drowsy and reassess if pt will take medication, and capture EKG at that time.

## 2020-02-22 NOTE — Discharge Summary (Signed)
  Austin Gutierrez, 39 y.o., male patient seen via tele psych by this provider, consulted with Dr. Lucianne Muss; and chart reviewed on 02/22/20.  On evaluation Austin Gutierrez reports he doesn't know why he is in the hospital "I went back in time.  There was some weed on my dresser, when I came back it was gone.  I went to [place], back in the past looking at my future.  I don't understand what is going on."  Patient states he has not done any drugs since 2015.  States he lives on [address] with his mother.  States he has been in a psychiatric hospital before "about 5 or 6 years ago."  States he is on disability.   During evaluation Austin Gutierrez is alert/oriented to self; calm/cooperative during assessment; but had to be directed several times by nurse tech to sit down.  He is also complaining of leg pain.  Patient is disorganized, with thought blocking.  He denies auditory/visual hallucinations but then stated "I don't know what if what I see is real the majority of time."  Patient denies suicidal/self-harm/homicidal ideation.    Disposition:  Patient recommended for inpatient psychiatric treatment and has been accepted to [facility].  Bed will not be available for transfer until after 7 PM.

## 2020-02-22 NOTE — ED Notes (Signed)
Pt talking with TTS. Provider notified.
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
## 2020-02-22 NOTE — ED Notes (Signed)
Report called, transport called

## 2020-02-22 NOTE — ED Notes (Signed)
Per mother, due to patient's paranoia, he is leary of taking pills-wants to know if his daily maintenance meds can be changed to IM-she would also like to be involved in patient's care plan-work number [phone]
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
## 2020-02-22 NOTE — ED Notes (Signed)
Patient mother called requesting Information on patient "I need to know everything that is going on with my son". Mother informed that patient was here and being treated and that no other information could be given due to HIPPA violation. Requesting to speak with someone else in the department that could "give more information". Forwarded to Press photographer.

## 2020-02-22 NOTE — ED Notes (Signed)
Pt leaving with Sheriff's department at this time.

## 2020-02-22 NOTE — Tx Team (Signed)
Initial Treatment Plan 02/22/2020 5:52 PM [name] FCZ:[id number]    PATIENT STRESSORS: Medication change or noncompliance Substance abuse   PATIENT STRENGTHS: Communication skills Physical Health Supportive family/friends   PATIENT IDENTIFIED PROBLEMS: paranoia  anxiety  Auditory hallucinations  Thought blocking               DISCHARGE CRITERIA:  Ability to meet basic life and health needs Improved stabilization in mood, thinking, and/or behavior Motivation to continue treatment in a less acute level of care Need for constant or close observation no longer present  PRELIMINARY DISCHARGE PLAN: Attend PHP/IOP Outpatient therapy Return to previous living arrangement  PATIENT/FAMILY INVOLVEMENT: This treatment plan has been presented to and reviewed with the patient, Austin Gutierrez.  The patient and family have been given the opportunity to ask questions and make suggestions.  Raylene Miyamoto, RN 02/22/2020, 5:52 PM

## 2020-02-23 DIAGNOSIS — F250 Schizoaffective disorder, bipolar type: Secondary | ICD-10-CM

## 2020-02-23 MED ORDER — LORAZEPAM 1 MG PO TABS
1.0000 mg | ORAL_TABLET | Freq: Three times a day (TID) | ORAL | Status: DC
  Administered 2020-02-23 – 2020-02-25 (×7): 1 mg via ORAL
  Filled 2020-02-23 (×7): qty 1

## 2020-02-23 MED ORDER — TEMAZEPAM 30 MG PO CAPS
30.0000 mg | ORAL_CAPSULE | Freq: Every day | ORAL | Status: DC
  Administered 2020-02-24 – 2020-02-27 (×4): 30 mg via ORAL
  Filled 2020-02-23 (×4): qty 1

## 2020-02-23 MED ORDER — BENZTROPINE MESYLATE 1 MG PO TABS
1.0000 mg | ORAL_TABLET | Freq: Two times a day (BID) | ORAL | Status: DC
  Administered 2020-02-23 – 2020-02-28 (×11): 1 mg via ORAL
  Filled 2020-02-23 (×16): qty 1

## 2020-02-23 MED ORDER — HALOPERIDOL 5 MG PO TABS
10.0000 mg | ORAL_TABLET | Freq: Three times a day (TID) | ORAL | Status: DC
  Administered 2020-02-23 – 2020-02-25 (×6): 10 mg via ORAL
  Filled 2020-02-23 (×10): qty 2

## 2020-02-23 NOTE — Progress Notes (Signed)
Recreation Therapy Notes  Date: 3.10.21 Time: 1000 Location: [address]   Group Topic: Communication, Team Building, Problem Solving  Goal Area(s) Addresses:  Patient will effectively work with peer towards shared goal.  Patient will identify skill used to make activity successful.  Patient will identify how skills used during activity can be used to reach post d/c goals.   Intervention: STEM Activity   Activity: [place] Landing. Patients were provided the following materials: 5 drinking straws, 5 rubber bands, 5 paper clips, 2 index cards and 2 drinking cups. Using the provided materials patients were asked to build a launching mechanisms to launch a ping pong ball approximately 12 feet. Patients were divided into teams of 3-5.   Education: Social Skills, Discharge Planning.   Education Outcome: Acknowledges education/In group clarification offered/Needs additional education.   Clinical Observations/Feedback:  Pt did not attend group.     Xavi Tomasik, LRT/CTRS         Ariely Riddell A 02/23/2020 11:23 AM 

## 2020-02-23 NOTE — Progress Notes (Signed)
Recreation Therapy Notes  INPATIENT RECREATION THERAPY ASSESSMENT  Patient Details Name: Austin Gutierrez MRN: [id number] DOB: 03-Feb-1981 Today's Date: 02/23/2020       Information Obtained From: Patient  Able to Participate in Assessment/Interview: Yes  Patient Presentation: (Drowsy)  Reason for Admission (Per Patient): Patient Unable to Identify  Patient Stressors: (None identified)  Coping Skills:   Journal, Sports, TV, Music, Deep Breathing, Meditate, Talk, Art, Prayer, Avoidance  Leisure Interests (2+):  Community - Other (Comment)(Ride around)  Frequency of Recreation/Participation: Other (Comment)(Daily)  Awareness of Community Resources:  Yes  Community Resources:  Other (Comment)([place]  Current Use: Yes  If no, Barriers?:    Expressed Interest in [employer] Information: No  County of Residence:  [place]  Patient Main Form of Transportation: Car  Patient Strengths:  Pushing towards goals  Patient Identified Areas of Improvement:  "Not at all"  Patient Goal for Hospitalization:  "go home, take a shower, come back out and get piece of mind"  Current SI (including self-harm):  No  Current HI:  No  Current AVH: No  Staff Intervention Plan: Group Attendance, Collaborate with Interdisciplinary Treatment Team  Consent to Intern Participation: N/A    [name], LRT/CTRS  [name] A 02/23/2020, 2:07 PM

## 2020-02-23 NOTE — [facility] Counselor (Signed)
CSW attempted to meet with patient to complete PSA. Patient sleeping and did not respond to CSW knocking at the door. Patient noted to exhibit bizarre behaviors earlier on the unit and appeared to be responding to internal stimuli.  CSW will follow up.  Enid Cutter, MSW, LCSW-A Clinical Social Worker [facility]  [phone]

## 2020-02-23 NOTE — Plan of Care (Signed)
Progress note  D: pt found in the hallway pacing; compliant with medication administration. Pt denies any physical complaints or pain. Pt continues to be fixated on leaving and pacing with their belongings in hand. Pt is still exhibiting bizarre behavior with constantly changing their socks in the hallway, dayroom and throwing them away multiple times throughout the day. Pt still seems preoccupied though denying voices. Pt continues to be pleasant though. Pt denies si/hi/ah/vh and verbally agrees to approach staff if these become apparent or before harming themself/others while at [facility].  A: Pt provided support and encouragement. Pt given medication per protocol and standing orders. Q87m safety checks implemented and continued.  R: Pt safe on the unit. Will continue to monitor.  Pt progressing in the following metrics  Problem: Education: Goal: Ability to state activities that reduce stress will improve Outcome: Progressing   Problem: Coping: Goal: Ability to identify and develop effective coping behavior will improve Outcome: Progressing   Problem: Self-Concept: Goal: Ability to identify factors that promote anxiety will improve Outcome: Progressing
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
## 2020-02-23 NOTE — BHH Suicide Risk Assessment (Signed)
[facility] Admission Suicide Risk Assessment   Nursing information obtained from:  Patient Demographic factors:  Male, Unemployed, Access to firearms, Low socioeconomic status Current Mental Status:  NA Loss Factors:  Decline in physical health Historical Factors:  Impulsivity Risk Reduction Factors:  Sense of responsibility to family, Positive social support, Religious beliefs about death, Living with another person, especially a relative, Positive therapeutic relationship  Total Time spent with patient: 45 minutes Principal Problem: <principal problem not specified> Diagnosis:  Active Problems:   Schizoaffective disorder (HCC)  Subjective Data: Patient required admission for exacerbation of schizoaffective type condition Continued Clinical Symptoms:  Alcohol Use Disorder Identification Test Final Score (AUDIT): 0 The "Alcohol Use Disorders Identification Test", Guidelines for Use in Primary Care, Second Edition.  World Science writer [facility]). Score between 0-7:  no or low risk or alcohol related problems. Score between 8-15:  moderate risk of alcohol related problems. Score between 16-19:  high risk of alcohol related problems. Score 20 or above:  warrants further diagnostic evaluation for alcohol dependence and treatment.   CLINICAL FACTORS:   Alcohol/Substance Abuse/Dependencies  Musculoskeletal: Strength & Muscle Tone: within normal limits Gait & Station: normal Patient leans: N/A  Psychiatric Specialty Exam: Physical Exam  Nursing note and vitals reviewed. Constitutional: He appears well-developed and well-nourished.  Cardiovascular: Normal rate and regular rhythm.    Review of Systems  Constitutional: Negative.   Eyes: Negative.   Respiratory: Negative.   Cardiovascular: Negative.   Gastrointestinal: Negative.   Endocrine: Negative.   Genitourinary: Negative.     Resp. rate 18, height 6\' 1"  (1.854 m), weight 69 kg.Body mass index is 20.07 kg/m.  General  Appearance: Disheveled  Eye Contact:  None  Speech:  Slurred  Volume:  Decreased  Mood:  Irritable  Affect:  Restricted  Thought Process:  Disorganized  Orientation:  Other:  ukn  Thought Content:  Hallucinations: Visual  Suicidal Thoughts:  No  Homicidal Thoughts:  No  Memory:  Immediate;   Poor Recent;   Poor Remote;   Poor  Judgement:  Impaired  Insight:  Lacking  Psychomotor Activity:  Decreased  Concentration:  Concentration: Poor and Attention Span: Poor  Recall:  Poor  Fund of Knowledge:  Poor  Language:  Poor  Akathisia:  Negative  Handed:  Right  AIMS (if indicated):     Assets:  Leisure Time Physical Health  ADL's:  Intact  Cognition:  WNL  Sleep:       COGNITIVE FEATURES THAT CONTRIBUTE TO RISK:  Loss of executive function    SUICIDE RISK:   Minimal: No identifiable suicidal ideation.  Patients presenting with no risk factors but with morbid ruminations; may be classified as minimal risk based on the severity of the depressive symptoms  PLAN OF CARE: see eval  I certify that inpatient services furnished can reasonably be expected to improve the patient's condition.   , MD 02/23/2020, 8:14 AM

## 2020-02-23 NOTE — Progress Notes (Signed)
   02/22/20 2215  Psych Admission Type (Psych Patients Only)  Admission Status Involuntary  Psychosocial Assessment  Patient Complaints Hyperactivity;Restlessness;Worrying;Suspiciousness  Eye Contact Fair  Facial Expression Anxious;Worried  Affect Anxious;Preoccupied  Speech Logical/coherent;Pressured;Tangential  Interaction Assertive;Hypervigilant  Motor Activity Fidgety;Restless  Appearance/Hygiene Unremarkable;In hospital gown  Behavior Characteristics Cooperative;Anxious;Fidgety;Guarded  Mood Anxious;Preoccupied;Apprehensive  Thought Process  Coherency Disorganized;Flight of ideas  Content Obsessions;Preoccupation;Paranoia  Delusions Controlled;Paranoid;Persecutory  Perception WDL  Hallucination None reported or observed  Judgment Poor  Confusion Mild  Danger to Self  Current suicidal ideation? Denies  Danger to Others  Danger to Others None reported or observed   Pt denies AVH but appears to be responding to internal stimuli.

## 2020-02-23 NOTE — Progress Notes (Signed)
   02/23/20 2159  Psych Admission Type (Psych Patients Only)  Admission Status Involuntary  Psychosocial Assessment  Patient Complaints Anxiety;Restlessness;Worrying  Eye Contact Brief  Facial Expression Anxious;Pained;Pensive;Worried  Affect Anxious;Preoccupied  Tree surgeon;Hypervigilant  Motor Activity Fidgety;Pacing;Restless  Appearance/Hygiene Unremarkable  Behavior Characteristics Cooperative;Restless  Thought Process  Coherency Disorganized;Flight of ideas  Content Obsessions;Preoccupation;Paranoia  Delusions Controlled;Paranoid;Persecutory  Perception WDL  Hallucination None reported or observed  Judgment Poor  Confusion Mild  Danger to Self  Current suicidal ideation? Denies  Danger to Others  Danger to Others None reported or observed   Pt continues to deny hallucinations although he stops in the middle of taking his medications to stare towards the unit door as if he is seeing something. Pt speaks to himself under his breath.

## 2020-02-23 NOTE — [facility] Group Notes (Signed)
LCSW Aftercare Discharge Planning Group Note  02/23/2020   Type of Group and Topic: Psychoeducational Group: Discharge Planning  Participation Level: Did Not Attend  Description of Group  Discharge planning group reviews patient's anticipated discharge plans and assists patients to anticipate and address any barriers to wellness/recovery in the community. Suicide prevention education is reviewed with patients in group.  Therapeutic Goals  1. Patients will state their anticipated discharge plan and mental health aftercare  2. Patients will identify potential barriers to wellness in the community setting  3. Patients will engage in problem solving, solution focused discussion of ways to anticipate and address barriers to wellness/recovery  Summary of Patient Progress  Plan for Discharge/Comments:  Transportation Means:  Supports:  Therapeutic Modalities:  Motivational Interviewing  Darreld Mclean, [place]  02/23/2020 1:46 PM

## 2020-02-23 NOTE — Progress Notes (Signed)
Pt up and out of his room, removing socks, walking towards unit door, mouthing words under his breath. Pt redirectable. Pt given trazodone 50 mg and Vistaril 25 mg.

## 2020-02-23 NOTE — Tx Team (Signed)
Interdisciplinary Treatment and Diagnostic Plan Update  02/23/2020 Time of Session: 9:00am Austin Gutierrez MRN: [id number]  Principal Diagnosis: <principal problem not specified>  Secondary Diagnoses: Active Problems:   Schizoaffective disorder ([place])   Current Medications:  Current Facility-Administered Medications  Medication Dose Route Frequency Provider Last Rate Last Admin  . benztropine (COGENTIN) tablet 1 mg  1 mg Oral BID Johnn Hai, MD      . divalproex (DEPAKOTE) DR tablet 500 mg  500 mg Oral Q12H Rankin, Shuvon B, NP   500 mg at 02/23/20 0800  . haloperidol (HALDOL) tablet 10 mg  10 mg Oral TID Johnn Hai, MD      . hydrOXYzine (ATARAX/VISTARIL) tablet 25 mg  25 mg Oral TID PRN [name], [name], NP   25 mg at 02/23/20 0001  . LORazepam (ATIVAN) tablet 1 mg  1 mg Oral TID Johnn Hai, MD      . temazepam (RESTORIL) capsule 30 mg  30 mg Oral QHS Johnn Hai, MD      . traZODone (DESYREL) tablet 50 mg  50 mg Oral QHS PRN [name], [name], NP   50 mg at 02/23/20 0001   PTA Medications: Medications Prior to Admission  Medication Sig Dispense Refill Last Dose  . benztropine (COGENTIN) 1 MG tablet Take 1 mg by mouth at bedtime.     . cyclobenzaprine (FLEXERIL) 10 MG tablet Take 1 tablet (10 mg total) by mouth 3 (three) times daily as needed for muscle spasms (or pain). (Patient not taking: Reported on 11/08/2017) 10 tablet 0   . divalproex (DEPAKOTE ER) 500 MG 24 hr tablet Take 500 mg by mouth at bedtime.     . divalproex (DEPAKOTE) 500 MG DR tablet Take 1 tablet (500 mg total) by mouth every 12 (twelve) hours. (Patient not taking: Reported on 11/08/2017) 60 tablet 0   . haloperidol (HALDOL) 10 MG tablet Take 10 mg by mouth at bedtime.     . haloperidol (HALDOL) 5 MG tablet Take 1 tablet (5 mg total) by mouth at bedtime. (Patient not taking: Reported on 02/21/2020) 30 tablet 0   . haloperidol decanoate (HALDOL DECANOATE) 100 MG/ML injection Inject 1 mL (100 mg total) into the muscle  every 30 (thirty) days. (Patient not taking: Reported on 11/08/2017) 1 mL 1   . ibuprofen (ADVIL,MOTRIN) 600 MG tablet Take 1 tablet (600 mg total) by mouth every 6 (six) hours as needed. (Patient not taking: Reported on 11/08/2017) 30 tablet 0   . methocarbamol (ROBAXIN) 500 MG tablet Take 1 tablet (500 mg total) by mouth 2 (two) times daily. (Patient not taking: Reported on 11/08/2017) 20 tablet 0   . mirtazapine (REMERON) 15 MG tablet Take 15 mg by mouth at bedtime.     . naproxen (NAPROSYN) 375 MG tablet Take 1 tablet (375 mg total) by mouth 2 (two) times daily as needed. (Patient not taking: Reported on 02/21/2020) 10 tablet 0   . traMADol (ULTRAM) 50 MG tablet Take 1 tablet (50 mg total) by mouth every 6 (six) hours as needed. (Patient not taking: Reported on 02/21/2020) 10 tablet 0     Patient Stressors: Medication change or noncompliance Substance abuse  Patient Strengths: Armed forces logistics/support/administrative officer Physical Health Supportive family/friends  Treatment Modalities: Medication Management, Group therapy, Case management,  1 to 1 session with clinician, Psychoeducation, Recreational therapy.   Physician Treatment Plan for Primary Diagnosis: <principal problem not specified> Long Term Goal(s): Improvement in symptoms so as ready for discharge Improvement in symptoms so as  ready for discharge   Short Term Goals: Ability to disclose and discuss suicidal ideas Ability to demonstrate self-control will improve Ability to identify and develop effective coping behaviors will improve Ability to maintain clinical measurements within normal limits will improve Compliance with prescribed medications will improve Ability to demonstrate self-control will improve Ability to identify and develop effective coping behaviors will improve Ability to maintain clinical measurements within normal limits will improve Compliance with prescribed medications will improve Ability to identify triggers associated with  substance abuse/mental health issues will improve  Medication Management: Evaluate patient's response, side effects, and tolerance of medication regimen.  Therapeutic Interventions: 1 to 1 sessions, Unit Group sessions and Medication administration.  Evaluation of Outcomes: Not Met  Physician Treatment Plan for Secondary Diagnosis: Active Problems:   Schizoaffective disorder ([place])  Long Term Goal(s): Improvement in symptoms so as ready for discharge Improvement in symptoms so as ready for discharge   Short Term Goals: Ability to disclose and discuss suicidal ideas Ability to demonstrate self-control will improve Ability to identify and develop effective coping behaviors will improve Ability to maintain clinical measurements within normal limits will improve Compliance with prescribed medications will improve Ability to demonstrate self-control will improve Ability to identify and develop effective coping behaviors will improve Ability to maintain clinical measurements within normal limits will improve Compliance with prescribed medications will improve Ability to identify triggers associated with substance abuse/mental health issues will improve     Medication Management: Evaluate patient's response, side effects, and tolerance of medication regimen.  Therapeutic Interventions: 1 to 1 sessions, Unit Group sessions and Medication administration.  Evaluation of Outcomes: Not Met   RN Treatment Plan for Primary Diagnosis: <principal problem not specified> Long Term Goal(s): Knowledge of disease and therapeutic regimen to maintain health will improve  Short Term Goals: Ability to verbalize frustration and anger appropriately will improve, Ability to demonstrate self-control, Ability to identify and develop effective coping behaviors will improve and Compliance with prescribed medications will improve  Medication Management: RN will administer medications as ordered by provider, will  assess and evaluate patient's response and provide education to patient for prescribed medication. RN will report any adverse and/or side effects to prescribing provider.  Therapeutic Interventions: 1 on 1 counseling sessions, Psychoeducation, Medication administration, Evaluate responses to treatment, Monitor vital signs and CBGs as ordered, Perform/monitor CIWA, COWS, AIMS and Fall Risk screenings as ordered, Perform wound care treatments as ordered.  Evaluation of Outcomes: Not Met   LCSW Treatment Plan for Primary Diagnosis: <principal problem not specified> Long Term Goal(s): Safe transition to appropriate next level of care at discharge, Engage patient in therapeutic group addressing interpersonal concerns.  Short Term Goals: Engage patient in aftercare planning with referrals and resources, Increase emotional regulation, Identify triggers associated with mental health/substance abuse issues and Increase skills for wellness and recovery  Therapeutic Interventions: Assess for all discharge needs, 1 to 1 time with Social worker, Explore available resources and support systems, Assess for adequacy in community support network, Educate family and significant other(s) on suicide prevention, Complete Psychosocial Assessment, Interpersonal group therapy.  Evaluation of Outcomes: Not Met  Progress in Treatment: Attending groups: No. Participating in groups: No. Taking medication as prescribed: Yes. Toleration medication: Yes. Family/Significant other contact made: No, will contact:  supports if consents are granted. Patient understands diagnosis: No. Discussing patient identified problems/goals with staff: Yes. Medical problems stabilized or resolved: Yes. Denies suicidal/homicidal ideation: Yes. Issues/concerns per patient self-inventory: Yes.  New problem(s) identified: No, Describe:  legal  issues.  New Short Term/Long Term Goal(s): medication management for mood stabilization;  elimination of SI thoughts; development of comprehensive mental wellness/sobriety plan.  Patient Goals: "Go home and lay down."  Discharge Plan or Barriers: Patient recently admitted to unit, CSW assessing for appropriate referrals.   Reason for Continuation of Hospitalization: Aggression Anxiety Hallucinations Medication stabilization  Estimated Length of Stay: 5-7 days  Attendees: Patient: Austin Gutierrez 02/23/2020 9:25 AM  Physician: Dr.Farah 02/23/2020 9:25 AM  Nursing:  02/23/2020 9:25 AM  RN Care Manager: 02/23/2020 9:25 AM  Social Worker: Stephanie Acre, [place] 02/23/2020 9:25 AM  Recreational Therapist:  02/23/2020 9:25 AM  Other:  02/23/2020 9:25 AM  Other:  02/23/2020 9:25 AM  Other: 02/23/2020 9:25 AM    Scribe for Treatment Team: Joellen [place], [place] 02/23/2020 9:25 AM

## 2020-02-24 NOTE — Progress Notes (Signed)
[facility] MD Progress Note  02/24/2020 7:42 AM Austin Gutierrez  MRN:  [id number] Subjective:   This is the latest of multiple admissions and healthcare system encounters for Mr. Austin Gutierrez, a 39 year old patient who has been diagnosed with paranoid schizophrenia versus schizoaffective disorder bipolar type.  He once again required petition for involuntary commitment due to a cluster of psychotic symptoms, disorganized behavior, and dangerousness involving a firearm. In the past the patient has also threatened individuals with firearms he is reportedly poorly compliant with medication, and probably cannabis dependent.  Today, the patient presents more organized, still makes no eye contact but his speech is more coherent and clear he can give me his address, he denies auditory or visual hallucinations, and believes has been hospitalized at least "5 times" and further believes that Haldol was the last medication use.  So he is giving more coherent answers.  Continues to appear distracted and fully oriented but showing improvement, denies hallucinations  Principal Problem: Exacerbation of psychotic disorder/ probable chronic cannabis dependency Diagnosis: Active Problems:   Schizoaffective disorder ([place])  Total Time spent with patient: 20 minutes  Past Psychiatric History: see eval   Past Medical History:  Past Medical History:  Diagnosis Date  . Asthma   . Headache, common migraine   . Schizoaffective disorder [facility])     Past Surgical History:  Procedure Laterality Date  . MOUTH SURGERY     Family History:  Family History  Problem Relation Age of Onset  . Depression Other    Family Psychiatric  History: see eval Social History:  Social History   Substance and Sexual Activity  Alcohol Use Yes  . Alcohol/week: 1.0 standard drinks  . Types: 1 Shots of liquor per week   Comment: denies regular alcohol use     Social History   Substance and Sexual Activity  Drug Use Yes  . Types:  Marijuana   Comment: "when i have it"    Social History   Socioeconomic History  . Marital status: Single    Spouse name: Not on file  . Number of children: Not on file  . Years of education: Not on file  . Highest education level: Not on file  Occupational History  . Not on file  Tobacco Use  . Smoking status: Light Tobacco Smoker    Packs/day: 0.50    Types: Cigarettes  . Smokeless tobacco: Never Used  Substance and Sexual Activity  . Alcohol use: Yes    Alcohol/week: 1.0 standard drinks    Types: 1 Shots of liquor per week    Comment: denies regular alcohol use  . Drug use: Yes    Types: Marijuana    Comment: "when i have it"  . Sexual activity: Yes  Other Topics Concern  . Not on file  Social History Narrative  . Not on file   Social Determinants of Health   Financial Resource Strain:   . Difficulty of Paying Living Expenses:   Food Insecurity:   . Worried About Charity fundraiser in the Last Year:   . Arboriculturist in the Last Year:   Transportation Needs:   . Film/video editor (Medical):   Marland Kitchen Lack of Transportation (Non-Medical):   Physical Activity:   . Days of Exercise per Week:   . Minutes of Exercise per Session:   Stress:   . Feeling of Stress :   Social Connections:   . Frequency of Communication with Friends and Family:   . Frequency  of Social Gatherings with Friends and Family:   . Attends Religious Services:   . Active Member of Clubs or Organizations:   . Attends Banker Meetings:   Marland Kitchen Marital Status:    Additional Social History:                         Sleep: Fair  Appetite:  Fair  Current Medications: Current Facility-Administered Medications  Medication Dose Route Frequency Provider Last Rate Last Admin  . benztropine (COGENTIN) tablet 1 mg  1 mg Oral BID [name], MD   1 mg at 02/23/20 1610  . divalproex (DEPAKOTE) DR tablet 500 mg  500 mg Oral Q12H Rankin, Shuvon B, NP   500 mg at 02/23/20 2157   . haloperidol (HALDOL) tablet 10 mg  10 mg Oral TID [name], MD   10 mg at 02/23/20 1610  . hydrOXYzine (ATARAX/VISTARIL) tablet 25 mg  25 mg Oral TID PRN [name], [name], NP   25 mg at 02/23/20 1122  . LORazepam (ATIVAN) tablet 1 mg  1 mg Oral TID [name], MD   1 mg at 02/23/20 2157  . temazepam (RESTORIL) capsule 30 mg  30 mg Oral QHS [name], MD      . traZODone (DESYREL) tablet 50 mg  50 mg Oral QHS PRN [name], [name], NP   50 mg at 02/23/20 0001    Lab Results: No results found for this or any previous visit (from the past 48 hour(s)).  Blood Alcohol level:  Lab Results  Component Value Date   ETH <10 02/21/2020   ETH <5 05/13/2016    Metabolic Disorder Labs: Lab Results  Component Value Date   HGBA1C 5.5 05/16/2016   MPG 117 07/30/2015   MPG 120 (H) 05/25/2014   Lab Results  Component Value Date   PROLACTIN 32.0 (H) 05/16/2016   PROLACTIN 24.5 (H) 08/01/2015   Lab Results  Component Value Date   CHOL 100 05/16/2016   TRIG 44 05/16/2016   HDL 44 05/16/2016   CHOLHDL 2.3 05/16/2016   VLDL 9 05/16/2016   LDLCALC 47 05/16/2016   LDLCALC 75 07/30/2015    Physical Findings: AIMS: Facial and Oral Movements Muscles of Facial Expression: None, normal Lips and Perioral Area: None, normal Jaw: None, normal Tongue: None, normal,Extremity Movements Upper (arms, wrists, hands, fingers): None, normal Lower (legs, knees, ankles, toes): None, normal, Trunk Movements Neck, shoulders, hips: None, normal, Overall Severity Severity of abnormal movements (highest score from questions above): None, normal Incapacitation due to abnormal movements: None, normal Patient's awareness of abnormal movements (rate only patient's report): No Awareness, Dental Status Current problems with teeth and/or dentures?: No Does patient usually wear dentures?: No  CIWA:    COWS:     Musculoskeletal: Strength & Muscle Tone: within normal limits Gait & Station: normal Patient  leans: N/A  Psychiatric Specialty Exam: Physical Exam  Review of Systems  Blood pressure (!) 127/93, pulse (!) 117, temperature 98.3 F (36.8 C), temperature source Oral, resp. rate 18, height 6\' 1"  (1.854 m), weight 69 kg.Body mass index is 20.07 kg/m.  General Appearance: Disheveled  Eye Contact:  Poor  Speech:  Slow  Volume:  Decreased  Mood:  Dysphoric and aloof  Affect:  Blunt  Thought Process:  Descriptions of Associations: Circumstantial  Orientation:  Other:  Person place situation  Thought Content:  More organized than previously noted, some poverty of content with detailed questioning  Suicidal Thoughts:  No  Homicidal Thoughts:  No  Memory:  Immediate;   Poor Recent;   Fair Remote;   Fair  Judgement:  Fair  Insight:  Fair  Psychomotor Activity:  Normal  Concentration:  Concentration: Fair and Attention Span: Fair  Recall:  [employer] of Knowledge:  Fair  Language:  Fair  Akathisia:  Negative  Handed:  Right  AIMS (if indicated):     Assets:  Communication Skills Physical Health Resilience Social Support  ADL's:  Intact  Cognition:  WNL  Sleep:  Number of Hours: 9   Treatment Plan Summary: Daily contact with patient to assess and evaluate symptoms and progress in treatment and Medication management  Haldol in anticipation of long-acting injectable continue cognitive and reality therapies no change in precautions   Colleen Donahoe, MD 02/24/2020, 7:42 AM

## 2020-02-24 NOTE — Progress Notes (Signed)
Recreation Therapy Notes  Date: 3.11.21 Time: 1000 Location: 500 Hall  Group Topic: Self-Esteem  Goal Area(s) Addresses:  Patient will successfully identify positive attributes about themselves.  Patient will successfully identify benefit of improved self-esteem.   Behavioral Response: Engaged  Intervention: Worksheet, pencils  Activity: My Education officer, environmental.  Patients were given a worksheet divided into 8 categories (good at, compliments, appearance, overcome, helped others, unique, value most and make others happy).  For each categorie, patients were to identify 3 strengths/qualities.  Education:  Self-Esteem, Dentist.   Education Outcome: Acknowledges education/In group clarification offered/Needs additional education  Clinical Observations/Feedback: LRT met with patient 1:1 to fill out sheet and process.  Pt identified strengths/qualities as follows: pt expressed being good at driving, video games and reading; likes that he doesn't feel "ugly"; overcome by "hopefully doing right for me"; helped others by giving money and taking them places; stated his car and family makes him unique; values family, friends and the whole world the most and has made others happy by "not calling them".  Pt stated identifying these things wasn't hard because he went off his past experiences.  Pt emphasized the way he sees himself is influenced by "what I've been through".  Pt also stated "everyone needs a little help sometimes and everyone on disability should be treated the same".     Victorino Sparrow, LRT/CTRS    Victorino Sparrow A 02/24/2020 10:35 AM

## 2020-02-25 LAB — HEPATITIS C ANTIBODY: HCV Ab: NONREACTIVE

## 2020-02-25 LAB — VALPROIC ACID LEVEL: Valproic Acid Lvl: 65 ug/mL (ref 50.0–100.0)

## 2020-02-25 MED ORDER — DIVALPROEX SODIUM 250 MG PO DR TAB
750.0000 mg | DELAYED_RELEASE_TABLET | Freq: Every day | ORAL | Status: DC
  Administered 2020-02-26 – 2020-02-27 (×2): 750 mg via ORAL
  Filled 2020-02-25 (×4): qty 3

## 2020-02-25 MED ORDER — HALOPERIDOL 5 MG PO TABS
5.0000 mg | ORAL_TABLET | Freq: Two times a day (BID) | ORAL | Status: DC
  Administered 2020-02-25 – 2020-02-26 (×2): 5 mg via ORAL
  Filled 2020-02-25 (×4): qty 1

## 2020-02-25 MED ORDER — HALOPERIDOL DECANOATE 100 MG/ML IM SOLN
150.0000 mg | INTRAMUSCULAR | Status: DC
  Administered 2020-02-25: 150 mg via INTRAMUSCULAR
  Filled 2020-02-25 (×2): qty 1.5

## 2020-02-25 MED ORDER — LORAZEPAM 0.5 MG PO TABS
0.5000 mg | ORAL_TABLET | Freq: Two times a day (BID) | ORAL | Status: AC
  Administered 2020-02-25 – 2020-02-26 (×2): 0.5 mg via ORAL
  Filled 2020-02-25 (×2): qty 1

## 2020-02-25 NOTE — [facility] Counselor (Signed)
CSW attempted to complete pts PSA throughout the morning and early afternoon. Pt appears to be sleeping in his room and did not respond to knocking and CSW calling out his name numerous times.   Iris Pert, MSW, LCSW Clinical Social Work 02/25/2020 11:24 AM

## 2020-02-25 NOTE — Progress Notes (Signed)
   02/25/20 2100  Psych Admission Type (Psych Patients Only)  Admission Status Involuntary  Psychosocial Assessment  Patient Complaints Anxiety  Eye Contact Brief  Facial Expression Anxious;Pained;Pensive;Worried  Affect Anxious;Preoccupied  Tree surgeon;Hypervigilant  Motor Activity Fidgety;Pacing;Restless  Appearance/Hygiene Unremarkable  Behavior Characteristics Cooperative  Mood Anxious  Thought Process  Coherency Disorganized;Flight of ideas  Content Obsessions;Preoccupation;Paranoia  Delusions Controlled;Paranoid;Persecutory  Perception WDL  Hallucination None reported or observed  Judgment Poor  Confusion Mild  Danger to Self  Current suicidal ideation? Denies  Danger to Others  Danger to Others None reported or observed

## 2020-02-25 NOTE — [facility] Counselor (Signed)
Adult Comprehensive Assessment  Patient ID: Austin Gutierrez, male   DOB: 06-Oct-1981, 39 y.o.   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 / Lack of resources (include bankruptcy): SSDI Housing / Lack of housing: Pt lives with his mother Physical health (include injuries & life threatening diseases): none reported Substance abuse: Pt reports marijuana use  Living/Environment/Situation:  Living Arrangements: Parent Living conditions (as described by patient or guardian): "it's crazy" Who else lives in the home?: Pts mother How long has patient lived in current situation?: 3 years  Family History:  Marital status: Single Does patient have children?: Yes How many children?: 1 How is patient's relationship with their children?: When asked if he had children pt reported "maybe one" then asked about his relationship with that possible one child pt reported "It's no kind of relationship"  Childhood History:  By whom was/is the patient raised?: Grandparents Additional childhood history information: pt reports he was raised by his grandmother Description of patient's relationship with caregiver when they were a child: Good Patient's description of current relationship with people who raised him/her: She is deceased Does patient have siblings?: No Did patient suffer any verbal/emotional/physical/sexual abuse as a child?: No Did patient suffer from severe childhood neglect?: No Has patient ever been sexually abused/assaulted/raped as an adolescent or adult?: No Was the patient ever a victim  of a crime or a disaster?: No Witnessed domestic violence?: No Has patient been effected by domestic violence as an adult?: No  Education:  Highest grade of school patient has completed: Unknown, pt was unable to identify highest grade completed or how old he was when he dropped out, but pt reported not finishing high school Currently a student?: No Learning disability?: Yes What learning problems does patient have?: Pt reports he was not good at math, no known learning disability diagnosis  Employment/Work Situation:   Employment situation: On disability Why is patient on disability: Mental health and medical per pt How long has patient been on disability: "a few years" What is the longest time patient has a held a job?: Unknown Did You Receive Any Psychiatric Treatment/Services While in the [employer]?: No Are There Guns or Other Weapons in Your Home?: No Are These Comptroller?: (N/A)  Financial Resources:   Financial resources: Insurance claims handler Does patient have a Lawyer or guardian?: No  Alcohol/Substance Abuse:   What has been your use of drugs/alcohol within the last 12 months?: Pt reports marijuana use If attempted suicide, did drugs/alcohol play a role in this?: No Alcohol/Substance Abuse Treatment Hx: Denies past history Has alcohol/substance abuse ever caused legal problems?: No  Social Support System:   Conservation officer, nature Support System: Passenger transport manager Support System: "family" Type of faith/religion: Sports administrator:   Leisure and Hobbies: "Talk with friends, visit friends, play video games"  Strengths/Needs:   What is the patient's perception of their strengths?: Pt unable to identify any  Discharge Plan:   Currently receiving community mental health services: Yes (From Whom)([facility]) Patient states concerns and preferences for aftercare planning are: Pt reports he is seen at [facility] and has an appointment on  3/22 Patient states they will know when they are safe and ready for discharge when: Pt asked what day it was and when told it was Friday, pt reported he needed to get out of the hospital and had things to do. Does patient have access to transportation?: Yes Does patient have financial barriers related to discharge medications?: No Will patient be returning to same living situation after discharge?: Yes  Summary/Recommendations:   Summary and Recommendations (to be completed by the evaluator): Pt is a 39 yo male living in [place], [place] ([place] with his mother. Pt presents to the hospital seeking treatment for medication stabilization, paranoia, and hallucinations. Pt has a diagnosis of schizoaffective disorder. Pt is single, unemployed on disability, has 1 child, reports good social supports, denies history of abuse/trauma, and reports marijuana use. Pt denies SI/HI/AVH currently. Pt reports being seen at [facility] and is agreeable to continue being seen there. Recommendations for pt include: crisis stabilization, therapeutic milieu, encourage group attendance and participation, medication management for mood stabilization, and development for comprehensive mental wellness plan. CSW assessing for appropriate referrals.  [place] MSW LCSW 02/25/2020 3:10 PM

## 2020-02-25 NOTE — Progress Notes (Signed)
Recreation Therapy Notes  Date: 3.12.21 Time: 1010 Location: 500 Hall Dayroom  Group Topic: Triggers  Goal Area(s) Addresses:  Patient will identify triggers. Patient will identify how to deal with triggers. Patient will identify benefit of dealing with triggers post d/c.  Intervention:  Worksheet, pencils   Activity: Triggers.  Patients were to identify their biggest triggers, how they avoid/reduce exposure to triggers and how they deal with triggers head on.  Education: Triggers, Discharge Planning  Education Outcome: Acknowledges understanding/In group clarification offered/Needs additional education.   Clinical Observations/Feedback: Pt did not attend group session.    [name], LRT/CTRS         [name], [place] A 02/25/2020 11:22 AM

## 2020-02-25 NOTE — [facility] Group Notes (Signed)
LCSW Group Therapy Note  02/25/2020 11:23 AM  Type of Therapy/Topic:  Group Therapy:  Balance in Life  Participation Level:  Did Not Attend  Description of Group:    This group will address the concept of balance and how it feels and looks when one is unbalanced. Patients will be encouraged to process areas in their lives that are out of balance and identify reasons for remaining unbalanced. Facilitators will guide patients in utilizing problem-solving interventions to address and correct the stressor making their life unbalanced. Understanding and applying boundaries will be explored and addressed for obtaining and maintaining a balanced life. Patients will be encouraged to explore ways to assertively make their unbalanced needs known to significant others in their lives, using other group members and facilitator for support and feedback.  Therapeutic Goals: 1. Patient will identify two or more emotions or situations they have that consume much of in their lives. 2. Patient will identify signs/triggers that life has become out of balance:  3. Patient will identify two ways to set boundaries in order to achieve balance in their lives:  4. Patient will demonstrate ability to communicate their needs through discussion and/or role plays  Summary of Patient Progress: x     Therapeutic Modalities:   Cognitive Behavioral Therapy Solution-Focused Therapy Assertiveness Training  Iris Pert, MSW, LCSW Clinical Social Work 02/25/2020 11:23 AM

## 2020-02-25 NOTE — Progress Notes (Signed)
[facility] MD Progress Note  02/25/2020 10:28 AM Austin Gutierrez  MRN:  [id number] Subjective:   This is the latest of multiple admissions and healthcare system encounters for Mr. Austin Gutierrez, 39 year old patient has been diagnosed with paranoid schizophrenia versus schizoaffective disorder bipolar type.  He once again required petition for involuntary commitment due to a cluster of psychotic symptoms, disorganized behavior, and dangerousness involving a firearm. Comorbidities include chronic cannabis usage, and nonspecific hepatitis.  His Depakote level is now 20  Remains in bed is generally alert oriented to person place situation and he denies current positive symptoms although has some negative symptoms.  Bit sluggish but no EPS or TD.  Again is minimally talkative and denying any auditory or visual hallucinations but understands he needs to stay probably through the weekend to adjust medications.  He is long-acting injectable was Haldol Decanoate 100 mg monthly and he states "all I need is a shot" and he has proven to be noncompliant with oral meds  Principal Problem: Chronic psychotic disorder acute exacerbation Diagnosis: Active Problems:   Schizoaffective disorder ([place])  Total Time spent with patient: 20 minutes  Past Psychiatric History: See eval  Past Medical History:  Past Medical History:  Diagnosis Date  . Asthma   . Headache, common migraine   . Schizoaffective disorder [facility])     Past Surgical History:  Procedure Laterality Date  . MOUTH SURGERY     Family History:  Family History  Problem Relation Age of Onset  . Depression Other    Family Psychiatric  History: No new data Social History:  Social History   Substance and Sexual Activity  Alcohol Use Yes  . Alcohol/week: 1.0 standard drinks  . Types: 1 Shots of liquor per week   Comment: denies regular alcohol use     Social History   Substance and Sexual Activity  Drug Use Yes  . Types: Marijuana   Comment: "when i  have it"    Social History   Socioeconomic History  . Marital status: Single    Spouse name: Not on file  . Number of children: Not on file  . Years of education: Not on file  . Highest education level: Not on file  Occupational History  . Not on file  Tobacco Use  . Smoking status: Light Tobacco Smoker    Packs/day: 0.50    Types: Cigarettes  . Smokeless tobacco: Never Used  Substance and Sexual Activity  . Alcohol use: Yes    Alcohol/week: 1.0 standard drinks    Types: 1 Shots of liquor per week    Comment: denies regular alcohol use  . Drug use: Yes    Types: Marijuana    Comment: "when i have it"  . Sexual activity: Yes  Other Topics Concern  . Not on file  Social History Narrative  . Not on file   Social Determinants of Health   Financial Resource Strain:   . Difficulty of Paying Living Expenses:   Food Insecurity:   . Worried About Charity fundraiser in the Last Year:   . Arboriculturist in the Last Year:   Transportation Needs:   . Film/video editor (Medical):   Marland Kitchen Lack of Transportation (Non-Medical):   Physical Activity:   . Days of Exercise per Week:   . Minutes of Exercise per Session:   Stress:   . Feeling of Stress :   Social Connections:   . Frequency of Communication with Friends and Family:   .  Frequency of Social Gatherings with Friends and Family:   . Attends Religious Services:   . Active Member of Clubs or Organizations:   . Attends Banker Meetings:   Marland Kitchen Marital Status:    Additional Social History:                         Sleep: Fair  Appetite:  Fair  Current Medications: Current Facility-Administered Medications  Medication Dose Route Frequency Provider Last Rate Last Admin  . benztropine (COGENTIN) tablet 1 mg  1 mg Oral BID [name], MD   1 mg at 02/25/20 0912  . divalproex (DEPAKOTE) DR tablet 500 mg  500 mg Oral Q12H Rankin, Shuvon B, NP   500 mg at 02/25/20 0912  . haloperidol (HALDOL) tablet  10 mg  10 mg Oral TID [name], MD   10 mg at 02/25/20 0912  . hydrOXYzine (ATARAX/VISTARIL) tablet 25 mg  25 mg Oral TID PRN [name], [name], NP   25 mg at 02/23/20 1122  . LORazepam (ATIVAN) tablet 1 mg  1 mg Oral TID [name], MD   1 mg at 02/25/20 0912  . temazepam (RESTORIL) capsule 30 mg  30 mg Oral QHS [name], MD   30 mg at 02/24/20 2057  . traZODone (DESYREL) tablet 50 mg  50 mg Oral QHS PRN [name], [name], NP   50 mg at 02/23/20 0001    Lab Results: No results found for this or any previous visit (from the past 48 hour(s)).  Blood Alcohol level:  Lab Results  Component Value Date   ETH <10 02/21/2020   ETH <5 05/13/2016    Metabolic Disorder Labs: Lab Results  Component Value Date   HGBA1C 5.5 05/16/2016   MPG 117 07/30/2015   MPG 120 (H) 05/25/2014   Lab Results  Component Value Date   PROLACTIN 32.0 (H) 05/16/2016   PROLACTIN 24.5 (H) 08/01/2015   Lab Results  Component Value Date   CHOL 100 05/16/2016   TRIG 44 05/16/2016   HDL 44 05/16/2016   CHOLHDL 2.3 05/16/2016   VLDL 9 05/16/2016   LDLCALC 47 05/16/2016   LDLCALC 75 07/30/2015    Physical Findings: AIMS: Facial and Oral Movements Muscles of Facial Expression: None, normal Lips and Perioral Area: None, normal Jaw: None, normal Tongue: None, normal,Extremity Movements Upper (arms, wrists, hands, fingers): None, normal Lower (legs, knees, ankles, toes): None, normal, Trunk Movements Neck, shoulders, hips: None, normal, Overall Severity Severity of abnormal movements (highest score from questions above): None, normal Incapacitation due to abnormal movements: None, normal Patient's awareness of abnormal movements (rate only patient's report): No Awareness, Dental Status Current problems with teeth and/or dentures?: No Does patient usually wear dentures?: No  CIWA:    COWS:     Musculoskeletal: Strength & Muscle Tone: flaccid Gait & Station: normal Patient leans: N/A  Psychiatric  Specialty Exam: Physical Exam  Review of Systems  Blood pressure (!) 127/93, pulse (!) 117, temperature 98.3 F (36.8 C), temperature source Oral, resp. rate 18, height 6\' 1"  (1.854 m), weight 69 kg.Body mass index is 20.07 kg/m.  General Appearance: Disheveled  Eye Contact:  None  Speech:  Slurred  Volume:  Decreased  Mood:  Dysphoric  Affect:  Restricted  Thought Process:  Irrelevant and Descriptions of Associations: Loose  Orientation:  Other:  Person place situation  Thought Content:  Delusions  Suicidal Thoughts:  No  Homicidal Thoughts:  No  Memory:  Immediate;   Poor Recent;   Fair Remote;   Fair  Judgement:  Impaired  Insight: improved  Psychomotor Activity:  Decreased  Concentration:  Concentration: Poor and Attention Span: Poor  Recall:  Poor  Fund of Knowledge:  Poor  Language:  Poor  Akathisia:  Negative  Handed:  Right  AIMS (if indicated):     Assets:  Physical Health Resilience  ADL's:  Intact  Cognition:  WNL/ compromised by prominent negative symptoms  Sleep:  Number of Hours: 9   Treatment Plan Summary: Daily contact with patient to assess and evaluate symptoms and progress in treatment and Medication management Readminister long-acting injectable Haldol Due to elevated liver enzymes of uncertain etiology, check hepatitis C panel Lower Depakote because of good blood level and general sluggishness Monitor on 15-minute checks monitor through weekend   [facility], MD 02/25/2020, 10:28 AM

## 2020-02-26 LAB — HEPATITIS PANEL, ACUTE
HCV Ab: NONREACTIVE
Hep A IgM: NONREACTIVE
Hep B C IgM: NONREACTIVE
Hepatitis B Surface Ag: NONREACTIVE

## 2020-02-26 MED ORDER — HALOPERIDOL 5 MG PO TABS
5.0000 mg | ORAL_TABLET | Freq: Every day | ORAL | Status: DC
  Filled 2020-02-26: qty 1

## 2020-02-26 MED ORDER — HALOPERIDOL 5 MG PO TABS: 5.0000 mg | ORAL_TABLET | Freq: Three times a day (TID) | ORAL | Status: DC | PRN

## 2020-02-26 NOTE — Progress Notes (Signed)
Pt would like for the doctor to call his mother [name] at [phone]

## 2020-02-26 NOTE — Progress Notes (Signed)
[facility] MD Progress Note  02/26/2020 9:32 AM Austin Gutierrez  MRN:  [id number] Subjective: Patient is a 39 year old male with a past psychiatric history significant for schizoaffective disorder who presented to the [facility] emergency department on 02/21/2020 after being placed under involuntary commitment by his mother for erratic and paranoid behavior.  Objective: Patient is seen and examined.  Patient is a 39 year old male with the above-stated past psychiatric history who is seen in follow-up.  Progress notes reveal that he remained in bed yesterday, but was generally alert.  He apparently had been receiving Haldol Decanoate injections, but had been noncompliant with that.  He received the Haldol Decanoate injection 150 mg on 02/25/2020.  He also remains on Cogentin, Depakote, oral Haldol, temazepam and trazodone.  This morning he stated that the voices were lower, but he stated he had a lot of things "going on in my life".  He stated he did not want to discuss it.  I would guess that this is probably some paranoid delusionary issue.  He denied suicidal or homicidal ideation.  He denied any previous history of hepatitis.  He denied any history of IV substance use.  Review of his laboratories revealed a mildly low potassium, a BUN of 25 and a creatinine of 1.49.  His previous creatinine 2 years ago was 1.39, 3 years ago was 1.25.  On admission his AST was significantly elevated at 209, and his ALT was elevated at 65.  His alkaline phosphatase was low at 31.  His CBC was essentially normal.  Acetaminophen was less than 10, salicylate was less than 7, Depakote on 3/12 was 65.  His blood alcohol on admission was less than 10.  Drug screen was positive for marijuana.  His EKG on admission showed a normal sinus rhythm with a QTc interval of approximately 460.  His blood pressure remains mildly elevated.  The 2 readings this morning is 134/94 and 121/91.  Initially his pulse was 68, but repeat showed  it to be 150.  He slept 7.25 hours last night.  Principal Problem: <principal problem not specified> Diagnosis: Active Problems:   Schizoaffective disorder (HCC)  Total Time spent with patient: 20 minutes  Past Psychiatric History: See admission H&P  Past Medical History:  Past Medical History:  Diagnosis Date  . Asthma   . Headache, common migraine   . Schizoaffective disorder [facility])     Past Surgical History:  Procedure Laterality Date  . MOUTH SURGERY     Family History:  Family History  Problem Relation Age of Onset  . Depression Other    Family Psychiatric  History: See admission H&P Social History:  Social History   Substance and Sexual Activity  Alcohol Use Yes  . Alcohol/week: 1.0 standard drinks  . Types: 1 Shots of liquor per week   Comment: denies regular alcohol use     Social History   Substance and Sexual Activity  Drug Use Yes  . Types: Marijuana   Comment: "when i have it"    Social History   Socioeconomic History  . Marital status: Single    Spouse name: Not on file  . Number of children: Not on file  . Years of education: Not on file  . Highest education level: Not on file  Occupational History  . Not on file  Tobacco Use  . Smoking status: Light Tobacco Smoker    Packs/day: 0.50    Types: Cigarettes  . Smokeless tobacco: Never Used  Substance and  Sexual Activity  . Alcohol use: Yes    Alcohol/week: 1.0 standard drinks    Types: 1 Shots of liquor per week    Comment: denies regular alcohol use  . Drug use: Yes    Types: Marijuana    Comment: "when i have it"  . Sexual activity: Yes  Other Topics Concern  . Not on file  Social History Narrative  . Not on file   Social Determinants of Health   Financial Resource Strain:   . Difficulty of Paying Living Expenses:   Food Insecurity:   . Worried About Programme researcher, broadcasting/film/video in the Last Year:   . Barista in the Last Year:   Transportation Needs:   . Freight forwarder  (Medical):   Marland Kitchen Lack of Transportation (Non-Medical):   Physical Activity:   . Days of Exercise per Week:   . Minutes of Exercise per Session:   Stress:   . Feeling of Stress :   Social Connections:   . Frequency of Communication with Friends and Family:   . Frequency of Social Gatherings with Friends and Family:   . Attends Religious Services:   . Active Member of Clubs or Organizations:   . Attends Banker Meetings:   Marland Kitchen Marital Status:    Additional Social History:                         Sleep: Good  Appetite:  Fair  Current Medications: Current Facility-Administered Medications  Medication Dose Route Frequency Provider Last Rate Last Admin  . benztropine (COGENTIN) tablet 1 mg  1 mg Oral BID [name], MD   1 mg at 02/26/20 0848  . divalproex (DEPAKOTE) DR tablet 750 mg  750 mg Oral QHS [name], MD      . haloperidol (HALDOL) tablet 5 mg  5 mg Oral BID [name], MD   5 mg at 02/26/20 0848  . haloperidol decanoate (HALDOL DECANOATE) 100 MG/ML injection 150 mg  150 mg Intramuscular Q30 days [name], MD   150 mg at 02/25/20 1409  . hydrOXYzine (ATARAX/VISTARIL) tablet 25 mg  25 mg Oral TID PRN [name], [name], NP   25 mg at 02/23/20 1122  . temazepam (RESTORIL) capsule 30 mg  30 mg Oral QHS [name], MD   30 mg at 02/25/20 2321  . traZODone (DESYREL) tablet 50 mg  50 mg Oral QHS PRN [name], [name], NP   50 mg at 02/23/20 0001    Lab Results:  Results for orders placed or performed during the hospital encounter of 02/22/20 (from the past 48 hour(s))  Valproic acid level     Status: None   Collection Time: 02/25/20  6:44 PM  Result Value Ref Range   Valproic Acid Lvl 65 50.0 - 100.0 ug/mL    Comment: Performed at [facility], [address], [place], [place] [address]  Hepatitis C antibody     Status: None   Collection Time: 02/25/20  6:44 PM  Result Value Ref Range   HCV Ab NON REACTIVE NON REACTIVE    Comment:  (NOTE) Nonreactive HCV antibody screen is consistent with no HCV infections,  unless recent infection is suspected or other evidence exists to indicate HCV infection. Performed at [facility], [address] [place], [place] [address]     Blood Alcohol level:  Lab Results  Component Value Date   ETH <10  02/21/2020   ETH <5 05/13/2016    Metabolic Disorder Labs: Lab Results  Component Value Date   HGBA1C 5.5 05/16/2016   MPG 117 07/30/2015   MPG 120 (H) 05/25/2014   Lab Results  Component Value Date   PROLACTIN 32.0 (H) 05/16/2016   PROLACTIN 24.5 (H) 08/01/2015   Lab Results  Component Value Date   CHOL 100 05/16/2016   TRIG 44 05/16/2016   HDL 44 05/16/2016   CHOLHDL 2.3 05/16/2016   VLDL 9 05/16/2016   LDLCALC 47 05/16/2016   LDLCALC 75 07/30/2015    Physical Findings: AIMS: Facial and Oral Movements Muscles of Facial Expression: None, normal Lips and Perioral Area: None, normal Jaw: None, normal Tongue: None, normal,Extremity Movements Upper (arms, wrists, hands, fingers): None, normal Lower (legs, knees, ankles, toes): None, normal, Trunk Movements Neck, shoulders, hips: None, normal, Overall Severity Severity of abnormal movements (highest score from questions above): None, normal Incapacitation due to abnormal movements: None, normal Patient's awareness of abnormal movements (rate only patient's report): No Awareness, Dental Status Current problems with teeth and/or dentures?: No Does patient usually wear dentures?: No  CIWA:    COWS:     Musculoskeletal: Strength & Muscle Tone: within normal limits Gait & Station: normal Patient leans: N/A  Psychiatric Specialty Exam: Physical Exam  Nursing note and vitals reviewed. Constitutional: He appears well-developed and well-nourished.  HENT:  Head: Normocephalic and atraumatic.  Respiratory: Effort normal.  Neurological: He is alert.    Review of Systems  Blood pressure (!) 121/91, pulse  (!) 150, temperature (!) 97.5 F (36.4 C), temperature source Oral, resp. rate 18, height 6\' 1"  (1.854 m), weight 69 kg, SpO2 (!) 73 %.Body mass index is 20.07 kg/m.  General Appearance: Disheveled  Eye Contact:  Minimal  Speech:  Slow and Slurred  Volume:  Decreased  Mood:  Dysphoric  Affect:  Congruent  Thought Process:  Coherent and Descriptions of Associations: Circumstantial  Orientation:  Full (Time, Place, and Person)  Thought Content:  Delusions and Hallucinations: Auditory  Suicidal Thoughts:  No  Homicidal Thoughts:  No  Memory:  Immediate;   Fair Recent;   Fair Remote;   Fair  Judgement:  Intact  Insight:  Fair  Psychomotor Activity:  Psychomotor Retardation  Concentration:  Concentration: Fair and Attention Span: Fair  Recall:  of Knowledge:  Fair  Language:  Fair  Akathisia:  Negative  Handed:  Right  AIMS (if indicated):     Assets:  Desire for Improvement Resilience  ADL's:  Intact  Cognition:  WNL  Sleep:  Number of Hours: 7.25     Treatment Plan Summary: Daily contact with patient to assess and evaluate symptoms and progress in treatment, Medication management and Plan : It is seen and examined.  Patient is a 39 year old male with the above-stated past psychiatric history who is seen in follow-up.   Gnosis: #1 paranoid schizophrenia versus schizoaffective disorder bipolar type, #2 cannabis use disorder, #3 elevated liver function enzymes, #4 elevated creatinine most likely secondary to dehydration, #5 orthostatic hypotension  Patient is seen in follow-up.  He is moving very slowly today.  I have a feeling that the Haldol Decanoate injection is getting back into his system.  I am going to decrease his Haldol to 5 mg p.o. every afternoon, get rid of the daytime dose.  I will write for as needed dose just in case.  I am going to repeat his blood work 41.  I am concerned  that the liver function enzyme issue may be related to his Depakote.  I have  spoken to staff, and working to push fluids to him to see if his creatinine will come down since both his BUN and creatinine were both elevated suggesting dehydration.  Go to go on to get an EKG today to look at his rhythm with the Haldol in his system.  No other changes at this point.  1.  Continue Cogentin but decreased to 0.5 mg p.o. twice daily in case it is affecting the orthostasis. 2.  Continue Depakote ER 750 mg p.o. nightly for mood stability. 3.  Decrease Haldol to 5 mg p.o. every afternoon for psychosis. 4.  Patient received a Haldol Decanoate injection 150 mg IM on 02/25/2020 for psychosis. 5.  Continue hydroxyzine 25 mg p.o. 3 times daily as needed anxiety. 6.  Continue Restoril 30 mg p.o. nightly for insomnia. 7.  Continue trazodone 50 mg p.o. nightly as needed insomnia. 8.  Repeat liver function enzymes and chemistries in a.m. tomorrow 9.  Ordered hepatitis panel. 10.  Disposition planning-in progress.  Sharma Covert, MD 02/26/2020, 9:32 AM

## 2020-02-26 NOTE — [facility] Group Notes (Signed)
  [facility] LCSW Group Therapy Note  Date/Time:  02/26/2020 11:15AM-12:00PM  Type of Therapy and Topic:  Group Therapy:  Feelings About Hospitalization  Participation Level:  Minimal   Description of Group This process group involved patients discussing their feelings related to being hospitalized, as well as the benefits they see to being in the hospital.  These feelings and benefits were itemized.  The group then brainstormed specific ways in which they could seek those same benefits when they discharge and return home.  Therapeutic Goals 1. Patient will identify and describe positive and negative feelings related to hospitalization 2. Patient will verbalize benefits of hospitalization to themselves personally 3. Patients will brainstorm together ways they can obtain similar benefits in the outpatient setting, identify barriers to wellness and possible solutions  Summary of Patient Progress:  The patient actively engaged in initial introductory exercise, introducing himself and sharing one specific feeling, reporting of feeling "okay". Pt expressed his primary feelings about being hospitalized are betrayal and aligned with another group participant in expressing "This is some fuck shit!". Pt focussed solely on the events surrounding what brought him into the hospital, engaging with group member whom expressed similar events, detailing of him being escorted by the [facility] versus identifying any positive or negative feelings about being hospitalized. Pt did not prove to actively identify any benefits of being hospitalized and left half way through group. Pt did not return to group.  Therapeutic Modalities Cognitive Behavioral Therapy Motivational Interviewing    Cyril Loosen, LCSWA 02/26/2020, 12:34 PM

## 2020-02-26 NOTE — Progress Notes (Signed)
Pt denies active AVH. He denies SI/HI. B/P & O2 reassessed and are WNL (see flow sheet). Pt continues to ambulate slowly and appears stiff but according to staff this is not a new onset. He is  compliant with taking scheduled medications.     02/26/20 0900  Psych Admission Type (Psych Patients Only)  Admission Status Involuntary  Psychosocial Assessment  Patient Complaints None  Eye Contact Brief  Facial Expression Anxious  Affect Preoccupied  Speech Tangential  Interaction Forwards little  Motor Activity Pacing  Appearance/Hygiene Disheveled  Behavior Characteristics Appropriate to situation  Mood Anxious  Thought Process  Coherency Circumstantial;Disorganized  Content Preoccupation  Delusions None reported or observed  Perception WDL  Hallucination None reported or observed  Judgment Poor  Confusion Mild  Danger to Self  Current suicidal ideation? Denies  Danger to Others  Danger to Others None reported or observed

## 2020-02-26 NOTE — Progress Notes (Signed)
Pt irritable this evening because he wants to leave the hospital.  He's been observed pacing the hallway and mumbling to himself.   EKG completed.

## 2020-02-26 NOTE — Progress Notes (Signed)
Fluids provided per Dr. Susann Givens request.

## 2020-02-26 NOTE — Progress Notes (Signed)
   02/26/20 2038  COVID-19 Daily Checkoff  Have you had a fever (temp > 37.80C/100F)  in the past 24 hours?  No  If you have had runny nose, nasal congestion, sneezing in the past 24 hours, has it worsened? No  COVID-19 EXPOSURE  Have you traveled outside the state in the past 14 days? No  Have you been in contact with someone with a confirmed diagnosis of COVID-19 or PUI in the past 14 days without wearing appropriate PPE? No  Have you been living in the same home as a person with confirmed diagnosis of COVID-19 or a PUI (household contact)? No  Have you been diagnosed with COVID-19? No

## 2020-02-27 LAB — CBC WITH DIFFERENTIAL/PLATELET
Abs Immature Granulocytes: 0.01 10*3/uL (ref 0.00–0.07)
Basophils Absolute: 0 10*3/uL (ref 0.0–0.1)
Basophils Relative: 1 %
Eosinophils Absolute: 0.2 10*3/uL (ref 0.0–0.5)
Eosinophils Relative: 3 %
HCT: 40.6 % (ref 39.0–52.0)
Hemoglobin: 13.7 g/dL (ref 13.0–17.0)
Immature Granulocytes: 0 %
Lymphocytes Relative: 12 %
Lymphs Abs: 0.7 10*3/uL (ref 0.7–4.0)
MCH: 31.1 pg (ref 26.0–34.0)
MCHC: 33.7 g/dL (ref 30.0–36.0)
MCV: 92.1 fL (ref 80.0–100.0)
Monocytes Absolute: 0.4 10*3/uL (ref 0.1–1.0)
Monocytes Relative: 7 %
Neutro Abs: 4.5 10*3/uL (ref 1.7–7.7)
Neutrophils Relative %: 77 %
Platelets: 271 10*3/uL (ref 150–400)
RBC: 4.41 MIL/uL (ref 4.22–5.81)
RDW: 12.1 % (ref 11.5–15.5)
WBC: 5.8 10*3/uL (ref 4.0–10.5)
nRBC: 0 % (ref 0.0–0.2)

## 2020-02-27 LAB — COMPREHENSIVE METABOLIC PANEL
ALT: 42 U/L (ref 0–44)
AST: 34 U/L (ref 15–41)
Albumin: 3.5 g/dL (ref 3.5–5.0)
Alkaline Phosphatase: 36 U/L — ABNORMAL LOW (ref 38–126)
Anion gap: 8 (ref 5–15)
BUN: 19 mg/dL (ref 6–20)
CO2: 26 mmol/L (ref 22–32)
Calcium: 8.7 mg/dL — ABNORMAL LOW (ref 8.9–10.3)
Chloride: 103 mmol/L (ref 98–111)
Creatinine, Ser: 1.22 mg/dL (ref 0.61–1.24)
GFR calc Af Amer: >60 mL/min (ref >60)
GFR calc non Af Amer: >60 mL/min (ref >60)
Glucose, Bld: 125 mg/dL — ABNORMAL HIGH (ref 70–99)
Potassium: 3.8 mmol/L (ref 3.5–5.1)
Sodium: 137 mmol/L (ref 135–145)
Total Bilirubin: 0.4 mg/dL (ref 0.3–1.2)
Total Protein: 6.2 g/dL — ABNORMAL LOW (ref 6.5–8.1)

## 2020-02-27 MED ORDER — HALOPERIDOL 5 MG PO TABS
10.0000 mg | ORAL_TABLET | Freq: Every day | ORAL | Status: DC
  Administered 2020-02-27: 10 mg via ORAL
  Filled 2020-02-27 (×3): qty 2

## 2020-02-27 NOTE — [facility] Group Notes (Signed)
[facility] Group Notes: (Clinical Social Work)   02/27/2020      Type of Therapy:  Group Therapy   Participation Level:  Did Not Attend - was invited individually by Nurse or MHT and chose not to attend.   Shirrell Solinger, LCSWA 02/27/2020, 12:33 PM    

## 2020-02-27 NOTE — Progress Notes (Signed)
[facility] MD Progress Note  02/27/2020 10:26 AM Austin Gutierrez  MRN:  [id number] Subjective:  Patient is a 39 year old male with a past psychiatric history significant for schizoaffective disorder who presented to the [facility] emergency department on 02/21/2020 after being placed under involuntary commitment by his mother for erratic and paranoid behavior.  Objective: Patient is seen and examined.  Patient is a 39 year old male with the above-stated past psychiatric history who is seen in follow-up.  His oral Haldol dose was decreased yesterday secondary to oversedation.  Unfortunately he did not sleep very well last night.  Nursing notes show that he only slept 3.75 hours last night.  We discussed increasing that dosage at bedtime.  There was concern that his Depakote may have been contributing to his abnormal liver function enzymes.  The good news is his liver function enzymes on repeat labs this morning were much improved and in the normal range.   Additionally his creatinine has dropped back down to 1.22 as well.  Rest of his CBC was normal, and the rest of his electrolyte panel was normal as well.  His blood pressure is elevated at 144/81, pulse was 103.  Again his sleep was only 3.75 hours.  He denied any auditory or visual hallucinations.  There was no gross paranoia.  He denied any suicidal or homicidal ideation.  Principal Problem: <principal problem not specified> Diagnosis: Active Problems:   Schizoaffective disorder (HCC)  Total Time spent with patient: 20 minutes  Past Psychiatric History: See admission H&P  Past Medical History:  Past Medical History:  Diagnosis Date  . Asthma   . Headache, common migraine   . Schizoaffective disorder [facility])     Past Surgical History:  Procedure Laterality Date  . MOUTH SURGERY     Family History:  Family History  Problem Relation Age of Onset  . Depression Other    Family Psychiatric  History: See admission H&P Social  History:  Social History   Substance and Sexual Activity  Alcohol Use Yes  . Alcohol/week: 1.0 standard drinks  . Types: 1 Shots of liquor per week   Comment: denies regular alcohol use     Social History   Substance and Sexual Activity  Drug Use Yes  . Types: Marijuana   Comment: "when i have it"    Social History   Socioeconomic History  . Marital status: Single    Spouse name: Not on file  . Number of children: Not on file  . Years of education: Not on file  . Highest education level: Not on file  Occupational History  . Not on file  Tobacco Use  . Smoking status: Light Tobacco Smoker    Packs/day: 0.50    Types: Cigarettes  . Smokeless tobacco: Never Used  Substance and Sexual Activity  . Alcohol use: Yes    Alcohol/week: 1.0 standard drinks    Types: 1 Shots of liquor per week    Comment: denies regular alcohol use  . Drug use: Yes    Types: Marijuana    Comment: "when i have it"  . Sexual activity: Yes  Other Topics Concern  . Not on file  Social History Narrative  . Not on file   Social Determinants of Health   Financial Resource Strain:   . Difficulty of Paying Living Expenses:   Food Insecurity:   . Worried About Charity fundraiser in the Last Year:   . [place] in the Last Year:  Transportation Needs:   . Freight forwarder (Medical):   Marland Kitchen Lack of Transportation (Non-Medical):   Physical Activity:   . Days of Exercise per Week:   . Minutes of Exercise per Session:   Stress:   . Feeling of Stress :   Social Connections:   . Frequency of Communication with Friends and Family:   . Frequency of Social Gatherings with Friends and Family:   . Attends Religious Services:   . Active Member of Clubs or Organizations:   . Attends Banker Meetings:   Marland Kitchen Marital Status:    Additional Social History:                         Sleep: Poor  Appetite:  Fair  Current Medications: Current Facility-Administered  Medications  Medication Dose Route Frequency Provider Last Rate Last Admin  . benztropine (COGENTIN) tablet 1 mg  1 mg Oral BID [name], MD   1 mg at 02/27/20 0829  . divalproex (DEPAKOTE) DR tablet 750 mg  750 mg Oral QHS [name], MD   750 mg at 02/26/20 2029  . haloperidol (HALDOL) tablet 10 mg  10 mg Oral QHS [name], MD      . haloperidol (HALDOL) tablet 5 mg  5 mg Oral Q8H PRN [name], MD      . haloperidol decanoate (HALDOL DECANOATE) 100 MG/ML injection 150 mg  150 mg Intramuscular Q30 days [name], MD   150 mg at 02/25/20 1409  . hydrOXYzine (ATARAX/VISTARIL) tablet 25 mg  25 mg Oral TID PRN [name], [name], NP   25 mg at 02/23/20 1122  . temazepam (RESTORIL) capsule 30 mg  30 mg Oral QHS [name], MD   30 mg at 02/26/20 2028  . traZODone (DESYREL) tablet 50 mg  50 mg Oral QHS PRN [name], [name], NP   50 mg at 02/23/20 0001    Lab Results:  Results for orders placed or performed during the hospital encounter of 02/22/20 (from the past 48 hour(s))  Valproic acid level     Status: None   Collection Time: 02/25/20  6:44 PM  Result Value Ref Range   Valproic Acid Lvl 65 50.0 - 100.0 ug/mL    Comment: Performed at [facility], [address] [place], [place] [address]  Hepatitis C antibody     Status: None   Collection Time: 02/25/20  6:44 PM  Result Value Ref Range   HCV Ab NON REACTIVE NON REACTIVE    Comment: (NOTE) Nonreactive HCV antibody screen is consistent with no HCV infections,  unless recent infection is suspected or other evidence exists to indicate HCV infection. Performed at [facility], [address] [place], [place] [address]   Hepatitis panel, acute     Status: None   Collection Time: 02/26/20  6:17 PM  Result Value Ref Range   Hepatitis B Surface Ag NON REACTIVE NON REACTIVE   HCV Ab NON REACTIVE NON REACTIVE    Comment: (NOTE) Nonreactive HCV antibody screen is consistent with no HCV  infections,  unless recent infection is suspected or other evidence exists to indicate HCV infection.    Hep A IgM NON REACTIVE NON REACTIVE   Hep B C IgM NON REACTIVE NON REACTIVE    Comment: Performed at [facility], [address], [place], [place] [address]  CBC with Differential/Platelet     Status: None  Collection Time: 02/27/20  6:41 AM  Result Value Ref Range   WBC 5.8 4.0 - 10.5 K/uL   RBC 4.41 4.22 - 5.81 MIL/uL   Hemoglobin 13.7 13.0 - 17.0 g/dL   HCT 54.6 27.0 - 35.0 %   MCV 92.1 80.0 - 100.0 fL   MCH 31.1 26.0 - 34.0 pg   MCHC 33.7 30.0 - 36.0 g/dL   RDW 09.3 81.8 - 29.9 %   Platelets 271 150 - 400 K/uL   nRBC 0.0 0.0 - 0.2 %   Neutrophils Relative % 77 %   Neutro Abs 4.5 1.7 - 7.7 K/uL   Lymphocytes Relative 12 %   Lymphs Abs 0.7 0.7 - 4.0 K/uL   Monocytes Relative 7 %   Monocytes Absolute 0.4 0.1 - 1.0 K/uL   Eosinophils Relative 3 %   Eosinophils Absolute 0.2 0.0 - 0.5 K/uL   Basophils Relative 1 %   Basophils Absolute 0.0 0.0 - 0.1 K/uL   Immature Granulocytes 0 %   Abs Immature Granulocytes 0.01 0.00 - 0.07 K/uL    Comment: Performed at [facility], [address] [place], [place] [address]  Comprehensive metabolic panel     Status: Abnormal   Collection Time: 02/27/20  6:41 AM  Result Value Ref Range   Sodium 137 135 - 145 mmol/L   Potassium 3.8 3.5 - 5.1 mmol/L   Chloride 103 98 - 111 mmol/L   CO2 26 22 - 32 mmol/L   Glucose, Bld 125 (H) 70 - 99 mg/dL    Comment: Glucose reference range applies only to samples taken after fasting for at least 8 hours.   BUN 19 6 - 20 mg/dL   Creatinine, Ser 6.78 0.61 - 1.24 mg/dL   Calcium 8.7 (L) 8.9 - 10.3 mg/dL   Total Protein 6.2 (L) 6.5 - 8.1 g/dL   Albumin 3.5 3.5 - 5.0 g/dL   AST 34 15 - 41 U/L   ALT 42 0 - 44 U/L   Alkaline Phosphatase 36 (L) 38 - 126 U/L   Total Bilirubin 0.4 0.3 - 1.2 mg/dL   GFR calc non Af Amer >60 >60 mL/min   GFR calc Af Amer >60 >60 mL/min   Anion  gap 8 5 - 15    Comment: Performed at [facility], [address] [place], [place] [address]    Blood Alcohol level:  Lab Results  Component Value Date   ETH <10 02/21/2020   ETH <5 05/13/2016    Metabolic Disorder Labs: Lab Results  Component Value Date   HGBA1C 5.5 05/16/2016   MPG 117 07/30/2015   MPG 120 (H) 05/25/2014   Lab Results  Component Value Date   PROLACTIN 32.0 (H) 05/16/2016   PROLACTIN 24.5 (H) 08/01/2015   Lab Results  Component Value Date   CHOL 100 05/16/2016   TRIG 44 05/16/2016   HDL 44 05/16/2016   CHOLHDL 2.3 05/16/2016   VLDL 9 05/16/2016   LDLCALC 47 05/16/2016   LDLCALC 75 07/30/2015    Physical Findings: AIMS: Facial and Oral Movements Muscles of Facial Expression: None, normal Lips and Perioral Area: None, normal Jaw: None, normal Tongue: None, normal,Extremity Movements Upper (arms, wrists, hands, fingers): None, normal Lower (legs, knees, ankles, toes): None, normal, Trunk Movements Neck, shoulders, hips: None, normal, Overall Severity Severity of abnormal movements (highest score from questions above): None, normal Incapacitation due to abnormal movements: None, normal Patient's awareness of abnormal movements (rate only patient's  report): No Awareness, Dental Status Current problems with teeth and/or dentures?: No Does patient usually wear dentures?: No  CIWA:    COWS:     Musculoskeletal: Strength & Muscle Tone: within normal limits Gait & Station: normal Patient leans: N/A  Psychiatric Specialty Exam: Physical Exam  Nursing note and vitals reviewed. Constitutional: He is oriented to person, place, and time. He appears well-developed and well-nourished.  HENT:  Head: Normocephalic and atraumatic.  Respiratory: Effort normal.  Neurological: He is alert and oriented to person, place, and time.    Review of Systems  Blood pressure (!) 144/81, pulse (!) 103, temperature 98.9 F (37.2 C), temperature  source Oral, resp. rate 18, height 6\' 1"  (1.854 m), weight 69 kg, SpO2 100 %.Body mass index is 20.07 kg/m.  General Appearance: Disheveled  Eye Contact:  Fair  Speech:  Normal Rate  Volume:  Decreased  Mood:  Dysphoric  Affect:  Congruent  Thought Process:  Goal Directed and Descriptions of Associations: Circumstantial  Orientation:  Full (Time, Place, and Person)  Thought Content:  WDL  Suicidal Thoughts:  No  Homicidal Thoughts:  No  Memory:  Immediate;   Fair Recent;   Fair Remote;   Fair  Judgement:  Intact  Insight:  Fair  Psychomotor Activity:  Decreased  Concentration:  Concentration: Fair and Attention Span: Fair  Recall:  of Knowledge:  Fair  Language:  Good  Akathisia:  Negative  Handed:  Right  AIMS (if indicated):     Assets:  Desire for Improvement Resilience  ADL's:  Intact  Cognition:  WNL  Sleep:  Number of Hours: 3.75     Treatment Plan Summary: Daily contact with patient to assess and evaluate symptoms and progress in treatment, Medication management and Plan : Patient is seen and examined.  Patient is a 39 year old male with the above-stated past psychiatric history who is seen in follow-up.  Diagnosis: #1 paranoid schizophrenia versus schizoaffective disorder bipolar type, #2 cannabis use disorder, #3 elevated liver function enzymes, #4 elevated creatinine most likely secondary to dehydration, #5 orthostatic hypotension  Patient seen in follow-up.  He is little bit more awake and alert today, but did not sleep well last night.  The good news is that his liver function enzymes have normalized as well as his creatinine has improved.  I will go on and increase his Haldol at bedtime to 10 mg p.o. nightly, but no other changes in his medications today, hopefully his sleep will improve and overall things will get better.  His hepatitis panel was all completely negative.  1.  Continue Cogentin 1 mg p.o. twice daily for side effects of medications. 2.   Continue Depakote DR 750 mg p.o. nightly for mood stability. 3.  Increase Haldol at bedtime to 10 mg p.o. nightly for psychosis and sleep. 4.  Continue Haldol 5 mg p.o. every 8 hours as needed agitation. 5.  Patient received a Haldol Decanoate injection 150 mg on 02/25/2020 for psychosis. 6.  Continue hydroxyzine 25 mg p.o. 3 times daily as needed anxiety. 7.  Continue temazepam 30 mg p.o. nightly for sleep. 8.  Continue trazodone 50 mg p.o. nightly as needed insomnia. 10.  Disposition planning-in progress.  04/26/2020, MD 02/27/2020, 10:26 AM

## 2020-02-27 NOTE — Progress Notes (Signed)
   02/27/20 2017  COVID-19 Daily Checkoff  Have you had a fever (temp > 37.80C/100F)  in the past 24 hours?  No  If you have had runny nose, nasal congestion, sneezing in the past 24 hours, has it worsened? No  COVID-19 EXPOSURE  Have you traveled outside the state in the past 14 days? No  Have you been in contact with someone with a confirmed diagnosis of COVID-19 or PUI in the past 14 days without wearing appropriate PPE? No  Have you been diagnosed with COVID-19? No

## 2020-02-27 NOTE — Progress Notes (Signed)
Patient has been pleasant and reported to writer that he needed this being here. He reports that he plans to make his circle of friends smaller. He asked about how he will stay on schedule with his shot every 30 days because his appointments are every 3 months. Writer asked that he follow up with his provider and double check with his doctor on tomorrow. Safety maintained with 15 min checks.

## 2020-02-27 NOTE — Progress Notes (Signed)
   02/27/20 0900  Psych Admission Type (Psych Patients Only)  Admission Status Involuntary  Psychosocial Assessment  Patient Complaints Worrying  Eye Contact Brief  Facial Expression Anxious  Affect Preoccupied;Anxious  Speech Tangential  Interaction Minimal;Forwards little  Motor Activity Slow;Pacing  Appearance/Hygiene Unremarkable  Behavior Characteristics Anxious;Pacing  Mood Anxious;Preoccupied  Thought Process  Coherency Circumstantial;Concrete thinking;Disorganized  Content Preoccupation  Delusions None reported or observed  Perception WDL  Hallucination None reported or observed  Judgment Poor  Confusion Mild  Danger to Self  Current suicidal ideation? Denies  Danger to Others  Danger to Others None reported or observed

## 2020-02-28 MED ORDER — HALOPERIDOL 10 MG PO TABS: 10.0000 mg | ORAL_TABLET | Freq: Every day | ORAL | 1 refills | Status: AC

## 2020-02-28 MED ORDER — BENZTROPINE MESYLATE 1 MG PO TABS: 1.0000 mg | ORAL_TABLET | Freq: Two times a day (BID) | ORAL | 2 refills | Status: AC

## 2020-02-28 MED ORDER — TEMAZEPAM 30 MG PO CAPS: 30.0000 mg | ORAL_CAPSULE | Freq: Every day | ORAL | 0 refills | Status: AC

## 2020-02-28 MED ORDER — DIVALPROEX SODIUM 250 MG PO DR TAB: 750.0000 mg | DELAYED_RELEASE_TABLET | Freq: Every day | ORAL | 2 refills | Status: DC

## 2020-02-28 MED ORDER — HALOPERIDOL DECANOATE 100 MG/ML IM SOLN: 150.0000 mg | INTRAMUSCULAR | 11 refills | Status: AC

## 2020-02-28 NOTE — Tx Team (Signed)
Interdisciplinary Treatment and Diagnostic Plan Update  02/28/2020 Time of Session: 9:00am Austin Gutierrez MRN: [id number]  Principal Diagnosis: <principal problem not specified>  Secondary Diagnoses: Active Problems:   Schizoaffective disorder (HCC)   Current Medications:  Current Facility-Administered Medications  Medication Dose Route Frequency Provider Last Rate Last Admin  . benztropine (COGENTIN) tablet 1 mg  1 mg Oral BID [name], MD   1 mg at 02/28/20 0807  . divalproex (DEPAKOTE) DR tablet 750 mg  750 mg Oral QHS [name], MD   750 mg at 02/27/20 2141  . haloperidol (HALDOL) tablet 10 mg  10 mg Oral QHS [name], MD   10 mg at 02/27/20 2142  . haloperidol (HALDOL) tablet 5 mg  5 mg Oral Q8H PRN [name], MD      . haloperidol decanoate (HALDOL DECANOATE) 100 MG/ML injection 150 mg  150 mg Intramuscular Q30 days [name], MD   150 mg at 02/25/20 1409  . hydrOXYzine (ATARAX/VISTARIL) tablet 25 mg  25 mg Oral TID PRN [name], [name], NP   25 mg at 02/23/20 1122  . temazepam (RESTORIL) capsule 30 mg  30 mg Oral QHS [name], MD   30 mg at 02/27/20 2141  . traZODone (DESYREL) tablet 50 mg  50 mg Oral QHS PRN [name], [name], NP   50 mg at 02/23/20 0001   PTA Medications: Medications Prior to Admission  Medication Sig Dispense Refill Last Dose  . benztropine (COGENTIN) 1 MG tablet Take 1 mg by mouth at bedtime.     . cyclobenzaprine (FLEXERIL) 10 MG tablet Take 1 tablet (10 mg total) by mouth 3 (three) times daily as needed for muscle spasms (or pain). (Patient not taking: Reported on 11/08/2017) 10 tablet 0   . divalproex (DEPAKOTE ER) 500 MG 24 hr tablet Take 500 mg by mouth at bedtime.     . divalproex (DEPAKOTE) 500 MG DR tablet Take 1 tablet (500 mg total) by mouth every 12 (twelve) hours. (Patient not taking: Reported on 11/08/2017) 60 tablet 0   . haloperidol (HALDOL) 10 MG tablet Take 10 mg by mouth at bedtime.     . haloperidol (HALDOL) 5  MG tablet Take 1 tablet (5 mg total) by mouth at bedtime. (Patient not taking: Reported on 02/21/2020) 30 tablet 0   . haloperidol decanoate (HALDOL DECANOATE) 100 MG/ML injection Inject 1 mL (100 mg total) into the muscle every 30 (thirty) days. (Patient not taking: Reported on 11/08/2017) 1 mL 1   . ibuprofen (ADVIL,MOTRIN) 600 MG tablet Take 1 tablet (600 mg total) by mouth every 6 (six) hours as needed. (Patient not taking: Reported on 11/08/2017) 30 tablet 0   . methocarbamol (ROBAXIN) 500 MG tablet Take 1 tablet (500 mg total) by mouth 2 (two) times daily. (Patient not taking: Reported on 11/08/2017) 20 tablet 0   . mirtazapine (REMERON) 15 MG tablet Take 15 mg by mouth at bedtime.     . naproxen (NAPROSYN) 375 MG tablet Take 1 tablet (375 mg total) by mouth 2 (two) times daily as needed. (Patient not taking: Reported on 02/21/2020) 10 tablet 0   . traMADol (ULTRAM) 50 MG tablet Take 1 tablet (50 mg total) by mouth every 6 (six) hours as needed. (Patient not taking: Reported on 02/21/2020) 10 tablet 0     Patient Stressors: Medication change or noncompliance Substance abuse  Patient Strengths: Manufacturing systems engineer Physical Health Supportive family/friends  Treatment Modalities: Medication Management, Group therapy, Case management,  1 to 1 session with clinician, Psychoeducation, Recreational therapy.   Physician Treatment Plan for Primary Diagnosis: <principal problem not specified> Long Term Goal(s): Improvement in symptoms so as ready for discharge Improvement in symptoms so as ready for discharge   Short Term Goals: Ability to disclose and discuss suicidal ideas Ability to demonstrate self-control will improve Ability to identify and develop effective coping behaviors will improve Ability to maintain clinical measurements within normal limits will improve Compliance with prescribed medications will improve Ability to demonstrate self-control will improve Ability to identify and  develop effective coping behaviors will improve Ability to maintain clinical measurements within normal limits will improve Compliance with prescribed medications will improve Ability to identify triggers associated with substance abuse/mental health issues will improve  Medication Management: Evaluate patient's response, side effects, and tolerance of medication regimen.  Therapeutic Interventions: 1 to 1 sessions, Unit Group sessions and Medication administration.  Evaluation of Outcomes: Adequate for Discharge  Physician Treatment Plan for Secondary Diagnosis: Active Problems:   Schizoaffective disorder (HCC)  Long Term Goal(s): Improvement in symptoms so as ready for discharge Improvement in symptoms so as ready for discharge   Short Term Goals: Ability to disclose and discuss suicidal ideas Ability to demonstrate self-control will improve Ability to identify and develop effective coping behaviors will improve Ability to maintain clinical measurements within normal limits will improve Compliance with prescribed medications will improve Ability to demonstrate self-control will improve Ability to identify and develop effective coping behaviors will improve Ability to maintain clinical measurements within normal limits will improve Compliance with prescribed medications will improve Ability to identify triggers associated with substance abuse/mental health issues will improve     Medication Management: Evaluate patient's response, side effects, and tolerance of medication regimen.  Therapeutic Interventions: 1 to 1 sessions, Unit Group sessions and Medication administration.  Evaluation of Outcomes: Adequate for Discharge   RN Treatment Plan for Primary Diagnosis: <principal problem not specified> Long Term Goal(s): Knowledge of disease and therapeutic regimen to maintain health will improve  Short Term Goals: Ability to verbalize frustration and anger appropriately will  improve, Ability to demonstrate self-control, Ability to identify and develop effective coping behaviors will improve and Compliance with prescribed medications will improve  Medication Management: RN will administer medications as ordered by provider, will assess and evaluate patient's response and provide education to patient for prescribed medication. RN will report any adverse and/or side effects to prescribing provider.  Therapeutic Interventions: 1 on 1 counseling sessions, Psychoeducation, Medication administration, Evaluate responses to treatment, Monitor vital signs and CBGs as ordered, Perform/monitor CIWA, COWS, AIMS and Fall Risk screenings as ordered, Perform wound care treatments as ordered.  Evaluation of Outcomes: Adequate for Discharge   LCSW Treatment Plan for Primary Diagnosis: <principal problem not specified> Long Term Goal(s): Safe transition to appropriate next level of care at discharge, Engage patient in therapeutic group addressing interpersonal concerns.  Short Term Goals: Engage patient in aftercare planning with referrals and resources, Increase emotional regulation, Identify triggers associated with mental health/substance abuse issues and Increase skills for wellness and recovery  Therapeutic Interventions: Assess for all discharge needs, 1 to 1 time with Social worker, Explore available resources and support systems, Assess for adequacy in community support network, Educate family and significant other(s) on suicide prevention, Complete Psychosocial Assessment, Interpersonal group therapy.  Evaluation of Outcomes: Adequate for Discharge  Progress in Treatment: Attending groups: Yes. Participating in groups: Yes. Taking medication as prescribed: Yes. Toleration medication: Yes. Family/Significant other contact made: Yes, individual(s)  contacted:  mother. Patient understands diagnosis: Yes. Discussing patient identified problems/goals with staff: Yes. Medical  problems stabilized or resolved: Yes. Denies suicidal/homicidal ideation: Yes. Issues/concerns per patient self-inventory: Yes.  New problem(s) identified: No, Describe:  legal issues.  New Short Term/Long Term Goal(s): medication management for mood stabilization; elimination of SI thoughts; development of comprehensive mental wellness/sobriety plan.  Patient Goals: "Go home and lay down."  Discharge Plan or Barriers: Returning home, following up with [facility].  Reason for Continuation of Hospitalization: Medication stabilization  Estimated Length of Stay: discharge today  Attendees: Patient: Austin Gutierrez 02/28/2020 10:35 AM  Physician: Dr.Farah 02/28/2020 10:35 AM  Nursing:  02/28/2020 10:35 AM  RN Care Manager: 02/28/2020 10:35 AM  Social Worker: Stephanie Acre, [place] 02/28/2020 10:35 AM  Recreational Therapist:  02/28/2020 10:35 AM  Other:  02/28/2020 10:35 AM  Other:  02/28/2020 10:35 AM  Other: 02/28/2020 10:35 AM    Scribe for Treatment Team: Joellen [place], [place] 02/28/2020 10:35 AM

## 2020-02-28 NOTE — [facility] Suicide Risk Assessment (Signed)
[facility] INPATIENT:  Family/Significant Other Suicide Prevention Education  Suicide Prevention Education:  Education Completed; [name],  mother [phone] has been identified by the patient as the family member/significant other with whom the patient will be residing, and identified as the person(s) who will aid the patient in the event of a mental health crisis (suicidal ideations/suicide attempt).  With written consent from the patient, the family member/significant other has been provided the following suicide prevention education, prior to the and/or following the discharge of the patient.  The suicide prevention education provided includes the following:  Suicide risk factors  Suicide prevention and interventions  National Suicide Hotline telephone number  [facility] assessment telephone number  [facility] Emergency Assistance 911  [facility] and/or Residential Mobile Crisis Unit telephone number  Request made of family/significant other to:  Remove weapons (e.g., guns, rifles, knives), all items previously/currently identified as safety concern.    Remove drugs/medications (over-the-counter, prescriptions, illicit drugs), all items previously/currently identified as a safety concern.  The family member/significant other verbalizes understanding of the suicide prevention education information provided.  The family member/significant other agrees to remove the items of safety concern listed above.  Mother reports patient has kept in contact with patient while hospitalized, patient sounds "better." Mother wanted to ensure that patient received his long acting injectable and has medication management follow up.  No other questions or concerns, mother will pick up patient at 12pm.  Darreld Mclean 02/28/2020, 9:15 AM

## 2020-02-28 NOTE — Plan of Care (Signed)
Pt was able to show some focus during group session at completion of recreation therapy group session.   [name], LRT/CTRS

## 2020-02-28 NOTE — [facility] Suicide Risk Assessment (Signed)
[facility] Discharge Suicide Risk Assessment   Principal Problem: Psychotic  disorder Discharge Diagnoses: Active Problems:   Schizoaffective disorder (HCC)   Total Time spent with patient: 45 minutes  Musculoskeletal: Strength & Muscle Tone: within normal limits Gait & Station: normal Patient leans: N/A  Psychiatric Specialty Exam: Physical Exam  Review of Systems  Blood pressure 117/82, pulse (!) 102, temperature 97.6 F (36.4 C), temperature source Oral, resp. rate 18, height 6\' 1"  (1.854 m), weight 69 kg, SpO2 100 %.Body mass index is 20.07 kg/m.  General Appearance: Casual  Eye Contact:  Good  Speech:  Clear and Coherent  Volume:  Normal  Mood:  Euthymic  Affect:  Congruent  Thought Process:  Coherent and Goal Directed  Orientation:  Full (Time, Place, and Person)  Thought Content:  Logical  Suicidal Thoughts:  No  Homicidal Thoughts:  No  Memory:  Immediate;   Fair Recent;   Fair Remote;   Fair  Judgement:  Fair  Insight:  Fair  Psychomotor Activity:  Normal  Concentration:  Concentration: Fair and Attention Span: Fair  Recall:  of Knowledge:  Fair  Language:  Fair  Akathisia:  Negative  Handed:  Right  AIMS (if indicated):     Assets:  Communication Skills Desire for Improvement  ADL's:  Intact  Cognition:  WNL  Sleep:  Number of Hours: 3.75   Mental Status Per Nursing Assessment::   On Admission:  NA  Demographic Factors:  Male  Loss Factors: NA  Historical Factors: NA  Risk Reduction Factors:   Sense of responsibility to family  Continued Clinical Symptoms:  Previous Psychiatric Diagnoses and Treatments  Cognitive Features That Contribute To Risk:  Loss of executive function    Suicide Risk:  Minimal: No identifiable suicidal ideation.  Patients presenting with no risk factors but with morbid ruminations; may be classified as minimal risk based on the severity of the depressive symptoms  Follow-up Information    [facility] Follow  up.   Contact information: [address] [place] [phone]           Plan Of Care/Follow-up recommendations:  Activity:  full  Courtnay Petrilla, MD 02/28/2020, 9:46 AM

## 2020-02-28 NOTE — Progress Notes (Signed)
Recreation Therapy Notes  INPATIENT RECREATION TR PLAN  Patient Details Name: Austin Gutierrez MRN: [id number] DOB: 12-15-1981 Today's Date: 02/28/2020  Rec Therapy Plan Is patient appropriate for Therapeutic Recreation?: Yes Treatment times per week: about 3 days Estimated Length of Stay: 5-7 days TR Treatment/Interventions: Group participation (Comment)  Discharge Criteria Pt will be discharged from therapy if:: Discharged Treatment plan/goals/alternatives discussed and agreed upon by:: Patient/family  Discharge Summary Short term goals set: See patient care plan Short term goals met: Adequate for discharge Progress toward goals comments: Groups attended Which groups?: Self-esteem Reason goals not met: Pt attended one group session. Therapeutic equipment acquired: N/A Reason patient discharged from therapy: Discharge from hospital Pt/family agrees with progress & goals achieved: Yes Date patient discharged from therapy: 02/28/20    Victorino Sparrow, LRT/CTRS  Ria Comment, Royal Vandevoort A 02/28/2020, 12:08 PM

## 2020-02-28 NOTE — Progress Notes (Signed)
Recreation Therapy Notes  Date: 3.15.21 Time: 1000 Location: [address] Dayroom  Group Topic: Coping Skills  Goal Area(s) Addresses:  Patient will identify positive coping skills. Patient will identify benefit of using coping skills post d/c.  Intervention: Mind Map  Activity: Mind Map.  Patients and LRT identified areas (anger, anxiety, depression, decision making, life changes, name calling, doubt and mood swings) in which coping skills would be needed. Patients were to then identify at least 3 coping skills for each area.  Education: Pharmacologist, Building control surveyor.   Education Outcome: Acknowledges understanding/In group clarification offered/Needs additional education.   Clinical Observations/Feedback: Pt did not attend group.   [name], LRT/CTRS         [name] A 02/28/2020 12:33 PM

## 2020-02-28 NOTE — Progress Notes (Signed)
Pt discharged to lobby. Pt was stable and appreciative at that time. All papers and prescriptions were given and valuables returned. Verbal understanding expressed. Denies SI/HI and A/VH. Pt given opportunity to express concerns and ask questions.  
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
## 2020-02-28 NOTE — Progress Notes (Signed)
  [facility] Adult Case Management Discharge Plan :  Will you be returning to the same living situation after discharge:  Yes,  home. At discharge, do you have transportation home?: Yes,  mother will pick up. Do you have the ability to pay for your medications: Yes,  Medicaid.  Release of information consent forms completed and in the chart; letter on chart.  Patient to Follow up at: Follow-up Information    [facility] Follow up on 03/01/2020.   Why: You are scheduled for an appointment on 03/01/20 at 2:15 pm.  This will be a virtual tele-health appointment. Contact information: [address] [place] [address] [phone]           Next level of care provider has access to [facility] Link:no  Safety Planning and Suicide Prevention discussed: Yes,  with mother.  Has patient been referred to the Quitline?: N/A patient is not a smoker  Patient has been referred for addiction treatment: Yes  Darreld Mclean, LCSWA 02/28/2020, 11:10 AM

## 2020-03-01 DIAGNOSIS — F250 Schizoaffective disorder, bipolar type: Secondary | ICD-10-CM | POA: Diagnosis not present

## 2020-03-15 DIAGNOSIS — F250 Schizoaffective disorder, bipolar type: Secondary | ICD-10-CM | POA: Diagnosis not present

## 2020-03-15 DIAGNOSIS — F17200 Nicotine dependence, unspecified, uncomplicated: Secondary | ICD-10-CM | POA: Diagnosis not present

## 2020-09-05 ENCOUNTER — Emergency Department (HOSPITAL_COMMUNITY): Payer: Medicare Other

## 2020-09-05 ENCOUNTER — Emergency Department (HOSPITAL_COMMUNITY)
Admission: EM | Admit: 2020-09-05 | Discharge: 2020-09-06 | Disposition: A | Discharge status: Home / Self Care | Payer: Medicare Other | Attending: Emergency Medicine | Admitting: Emergency Medicine

## 2020-09-05 ENCOUNTER — Encounter (HOSPITAL_COMMUNITY): Payer: Self-pay | Admitting: Emergency Medicine

## 2020-09-05 ENCOUNTER — Other Ambulatory Visit: Payer: Self-pay

## 2020-09-05 DIAGNOSIS — M79622 Pain in left upper arm: Secondary | ICD-10-CM | POA: Diagnosis not present

## 2020-09-05 DIAGNOSIS — J45909 Unspecified asthma, uncomplicated: Secondary | ICD-10-CM | POA: Insufficient documentation

## 2020-09-05 DIAGNOSIS — F32A Depression, unspecified: Secondary | ICD-10-CM

## 2020-09-05 DIAGNOSIS — Z79899 Other long term (current) drug therapy: Secondary | ICD-10-CM | POA: Insufficient documentation

## 2020-09-05 DIAGNOSIS — F329 Major depressive disorder, single episode, unspecified: Secondary | ICD-10-CM | POA: Insufficient documentation

## 2020-09-05 DIAGNOSIS — F17210 Nicotine dependence, cigarettes, uncomplicated: Secondary | ICD-10-CM | POA: Diagnosis not present

## 2020-09-05 DIAGNOSIS — M79602 Pain in left arm: Secondary | ICD-10-CM | POA: Diagnosis not present

## 2020-09-05 LAB — RAPID URINE DRUG SCREEN, HOSP PERFORMED
Amphetamines: NOT DETECTED
Barbiturates: NOT DETECTED
Benzodiazepines: NOT DETECTED
Cocaine: NOT DETECTED
Opiates: NOT DETECTED
Tetrahydrocannabinol: POSITIVE — AB

## 2020-09-05 NOTE — ED Provider Notes (Signed)
[name]-EMERGENCY DEPT Provider Note   CSN: [id number] Arrival date & time: 09/05/20  2140     History Chief Complaint  Patient presents with  . Medical Clearance    Austin Gutierrez is a 39 y.o. male.  Patient to ED with mom who reports he lives with her, he has a history of schizophrenia and not taking medications, and has become increasingly quiet, sedentary and, she feels, depressed over the last 3 weeks. No suicidal ideations, homicidal ideations, fits of anger, combativeness. Per mom, he does not appear to be hallucinating. He is eating less and stays in his room most of the time. He sleeps a lot. She feels he needs mental health help. The patient denies any need to be in the emergency department and does not understand why she brought him here. He does report pain in the left upper arm that he is not sure when it started. No known injury. He is left hand dominant.   The history is provided by the patient. No language interpreter was used.       Past Medical History:  Diagnosis Date  . Asthma   . Headache, common migraine   . Schizoaffective disorder [facility])     Patient Active Problem List   Diagnosis Date Noted  . Schizoaffective disorder (HCC) 02/22/2020  . Tobacco use disorder 05/15/2016  . Schizoaffective disorder, bipolar type (HCC) 05/15/2016    Past Surgical History:  Procedure Laterality Date  . MOUTH SURGERY         Family History  Problem Relation Age of Onset  . Depression Other     Social History   Tobacco Use  . Smoking status: Light Tobacco Smoker    Packs/day: 0.50    Types: Cigarettes  . Smokeless tobacco: Never Used  Vaping Use  . Vaping Use: Never used  Substance Use Topics  . Alcohol use: Yes    Alcohol/week: 1.0 standard drink    Types: 1 Shots of liquor per week    Comment: denies regular alcohol use  . Drug use: Yes    Types: Marijuana    Comment: "when i have it"    Home Medications Prior to  Admission medications   Medication Sig Start Date End Date Taking? Authorizing Provider  benztropine (COGENTIN) 1 MG tablet Take 1 tablet (1 mg total) by mouth 2 (two) times daily. 02/28/20   [name], MD  cyclobenzaprine (FLEXERIL) 10 MG tablet Take 1 tablet (10 mg total) by mouth 3 (three) times daily as needed for muscle spasms (or pain). Patient not taking: Reported on 11/08/2017 08/18/16   Trixie Dredge, PA-C  divalproex (DEPAKOTE) 250 MG DR tablet Take 3 tablets (750 mg total) by mouth at bedtime. 02/28/20   [name], MD  haloperidol (HALDOL) 10 MG tablet Take 1 tablet (10 mg total) by mouth at bedtime. 02/28/20   [name], MD  haloperidol decanoate (HALDOL DECANOATE) 100 MG/ML injection Inject 1.5 mLs (150 mg total) into the muscle every 30 (thirty) days. Due 4/10 03/26/20   [name], MD  ibuprofen (ADVIL,MOTRIN) 600 MG tablet Take 1 tablet (600 mg total) by mouth every 6 (six) hours as needed. Patient not taking: Reported on 11/08/2017 09/18/17   Dartha Lodge, PA-C  temazepam (RESTORIL) 30 MG capsule Take 1 capsule (30 mg total) by mouth at bedtime. 02/28/20   [name], MD    Allergies    Mushroom extract complex, Excedrin back & [acetaminophen-aspirin buffered], Fish allergy, and Onion  Review of Systems   Review of Systems  Constitutional: Negative for chills and fever.  HENT: Negative.   Respiratory: Negative.   Cardiovascular: Negative.   Gastrointestinal: Negative.   Musculoskeletal:       See HPI.  Skin: Negative.   Neurological: Negative.   Psychiatric/Behavioral:       See HPI.    Physical Exam Updated Vital Signs BP (!) 151/100   Pulse (!) 120   Temp 98.8 F (37.1 C)   Resp 19   SpO2 95%   Physical Exam Vitals and nursing note reviewed.  Constitutional:      Appearance: He is well-developed.  HENT:     Head: Normocephalic.  Cardiovascular:     Rate and Rhythm: Normal rate and regular rhythm.  Pulmonary:     Effort: Pulmonary effort is  normal.     Breath sounds: Normal breath sounds. No wheezing, rhonchi or rales.  Abdominal:     General: Bowel sounds are normal.     Palpations: Abdomen is soft.     Tenderness: There is no abdominal tenderness. There is no guarding or rebound.  Musculoskeletal:        General: Normal range of motion.     Cervical back: Normal range of motion and neck supple.     Comments: Mild muscular swelling distal left upper arm. No redness, induration or warmth. FROM. +pulse distally.   Skin:    General: Skin is warm and dry.     Findings: No rash.  Neurological:     Mental Status: He is alert and oriented to person, place, and time.  Psychiatric:     Comments: No SI or HI. He does not appear to be responding to internal stimuli. He is cooperative.      ED Results / Procedures / Treatments   Labs (all labs ordered are listed, but only abnormal results are displayed) Labs Reviewed  CBC WITH DIFFERENTIAL/PLATELET  BASIC METABOLIC PANEL  ETHANOL  RAPID URINE DRUG SCREEN, HOSP PERFORMED   Results for orders placed or performed during the hospital encounter of 09/05/20  CBC with Differential  Result Value Ref Range   WBC 8.5 4.0 - 10.5 K/uL   RBC 4.56 4.22 - 5.81 MIL/uL   Hemoglobin 13.9 13.0 - 17.0 g/dL   HCT 67.5 39 - 52 %   MCV 92.5 80.0 - 100.0 fL   MCH 30.5 26.0 - 34.0 pg   MCHC 32.9 30.0 - 36.0 g/dL   RDW 91.6 38.4 - 66.5 %   Platelets 250 150 - 400 K/uL   nRBC 0.0 0.0 - 0.2 %   Neutrophils Relative % 71 %   Neutro Abs 6.2 1.7 - 7.7 K/uL   Lymphocytes Relative 18 %   Lymphs Abs 1.6 0.7 - 4.0 K/uL   Monocytes Relative 8 %   Monocytes Absolute 0.7 0 - 1 K/uL   Eosinophils Relative 1 %   Eosinophils Absolute 0.0 0 - 0 K/uL   Basophils Relative 1 %   Basophils Absolute 0.1 0 - 0 K/uL   Immature Granulocytes 1 %   Abs Immature Granulocytes 0.04 0.00 - 0.07 K/uL  Basic metabolic panel  Result Value Ref Range   Sodium 139 135 - 145 mmol/L   Potassium 3.2 (L) 3.5 - 5.1 mmol/L    Chloride 103 98 - 111 mmol/L   CO2 24 22 - 32 mmol/L   Glucose, Bld 102 (H) 70 - 99 mg/dL   BUN 18 6 - 20  mg/dL   Creatinine, Ser 6.07 (H) 0.61 - 1.24 mg/dL   Calcium 9.3 8.9 - 37.1 mg/dL   GFR calc non Af Amer >60 >60 mL/min   GFR calc Af Amer >60 >60 mL/min   Anion gap 12 5 - 15  Urine rapid drug screen (hosp performed)  Result Value Ref Range   Opiates NONE DETECTED NONE DETECTED   Cocaine NONE DETECTED NONE DETECTED   Benzodiazepines NONE DETECTED NONE DETECTED   Amphetamines NONE DETECTED NONE DETECTED   Tetrahydrocannabinol POSITIVE (A) NONE DETECTED   Barbiturates NONE DETECTED NONE DETECTED    EKG None  Radiology No results found. DG Humerus Left  Result Date: 09/05/2020 CLINICAL DATA:  Atraumatic left upper extremity pain. EXAM: LEFT HUMERUS - 2+ VIEW COMPARISON:  None. FINDINGS: There is no evidence of fracture or other focal bone lesions. Soft tissues are unremarkable. IMPRESSION: Negative. Electronically Signed   By: Aram Candela M.D.   On: 09/05/2020 23:02    Procedures Procedures (including critical care time)  Medications Ordered in ED Medications - No data to display  ED Course  I have reviewed the triage vital signs and the nursing notes.  Pertinent labs & imaging results that were available during my care of the patient were reviewed by me and considered in my medical decision making (see chart for details).    MDM Rules/Calculators/A&P                          Patient to ED with mom, [name], with concerns as detailed in the HPI.   The patient denies SI/HI/AVH. Discussed with mom that we could provide a TTS consultation in the ED to see if further assistance could be offered, however, he is not felt to meet IVC criteria and if he wants to leave he would be able to make that decision. For now, the patient states he will stay.   1:00 - the patient is becoming restless. Pacing. He states he does not want to "lie here with nothing being done".  Explained the process and he reports he wants to go home. Mom, [name], contacted who will come pick him up. She is updated on his evaluation, and informed of the Behavioral Health Walk in clinic which may be of more help to her.   Imaging of the left arm is negative. He is moving the arm without limitation. He is no longer complaining of pain. No evidence to suggest infection. Recommend PCP follow up as needed.   Final Clinical Impression(s) / ED Diagnoses Final diagnoses:  None   1. Depression 2. Left arm pain  Rx / DC Orders ED Discharge Orders    None       Danne Harbor 09/06/20 Lajoyce Lauber, MD 09/07/20 1358

## 2020-09-05 NOTE — ED Triage Notes (Addendum)
Patient reports brought in by mother. States "I don't know why she brought me here." Denies complaints. Hx schizoaffective disorder.  Patient uncooperative in triage and not staying in triage room.

## 2020-09-06 LAB — CBC WITH DIFFERENTIAL/PLATELET
Abs Immature Granulocytes: 0.04 10*3/uL (ref 0.00–0.07)
Basophils Absolute: 0.1 10*3/uL (ref 0.0–0.1)
Basophils Relative: 1 %
Eosinophils Absolute: 0 10*3/uL (ref 0.0–0.5)
Eosinophils Relative: 1 %
HCT: 42.2 % (ref 39.0–52.0)
Hemoglobin: 13.9 g/dL (ref 13.0–17.0)
Immature Granulocytes: 1 %
Lymphocytes Relative: 18 %
Lymphs Abs: 1.6 10*3/uL (ref 0.7–4.0)
MCH: 30.5 pg (ref 26.0–34.0)
MCHC: 32.9 g/dL (ref 30.0–36.0)
MCV: 92.5 fL (ref 80.0–100.0)
Monocytes Absolute: 0.7 10*3/uL (ref 0.1–1.0)
Monocytes Relative: 8 %
Neutro Abs: 6.2 10*3/uL (ref 1.7–7.7)
Neutrophils Relative %: 71 %
Platelets: 250 10*3/uL (ref 150–400)
RBC: 4.56 MIL/uL (ref 4.22–5.81)
RDW: 12.7 % (ref 11.5–15.5)
WBC: 8.5 10*3/uL (ref 4.0–10.5)
nRBC: 0 % (ref 0.0–0.2)

## 2020-09-06 LAB — ETHANOL: Alcohol, Ethyl (B): <10 mg/dL (ref <10)

## 2020-09-06 LAB — BASIC METABOLIC PANEL
Anion gap: 12 (ref 5–15)
BUN: 18 mg/dL (ref 6–20)
CO2: 24 mmol/L (ref 22–32)
Calcium: 9.3 mg/dL (ref 8.9–10.3)
Chloride: 103 mmol/L (ref 98–111)
Creatinine, Ser: 1.27 mg/dL — ABNORMAL HIGH (ref 0.61–1.24)
GFR calc Af Amer: >60 mL/min (ref >60)
GFR calc non Af Amer: >60 mL/min (ref >60)
Glucose, Bld: 102 mg/dL — ABNORMAL HIGH (ref 70–99)
Potassium: 3.2 mmol/L — ABNORMAL LOW (ref 3.5–5.1)
Sodium: 139 mmol/L (ref 135–145)

## 2020-09-06 NOTE — Discharge Instructions (Signed)
Please return to the emergency department any time you feel you need medical or mental health assistance.

## 2020-09-29 DIAGNOSIS — R5383 Other fatigue: Secondary | ICD-10-CM | POA: Diagnosis not present

## 2020-09-29 DIAGNOSIS — Z131 Encounter for screening for diabetes mellitus: Secondary | ICD-10-CM | POA: Diagnosis not present

## 2020-09-29 DIAGNOSIS — Z0000 Encounter for general adult medical examination without abnormal findings: Secondary | ICD-10-CM | POA: Diagnosis not present

## 2020-09-29 DIAGNOSIS — E7800 Pure hypercholesterolemia, unspecified: Secondary | ICD-10-CM | POA: Diagnosis not present

## 2020-09-29 DIAGNOSIS — Z79899 Other long term (current) drug therapy: Secondary | ICD-10-CM | POA: Diagnosis not present

## 2020-10-16 ENCOUNTER — Encounter (HOSPITAL_COMMUNITY): Payer: Self-pay

## 2020-10-16 ENCOUNTER — Encounter (HOSPITAL_COMMUNITY): Payer: Self-pay | Admitting: Emergency Medicine

## 2020-10-16 ENCOUNTER — Emergency Department (HOSPITAL_COMMUNITY): Payer: Medicare Other

## 2020-10-16 ENCOUNTER — Emergency Department (HOSPITAL_COMMUNITY)
Admission: EM | Admit: 2020-10-16 | Discharge: 2020-10-16 | Disposition: A | Discharge status: Home / Self Care | Payer: Medicare Other | Attending: Emergency Medicine | Admitting: Emergency Medicine

## 2020-10-16 ENCOUNTER — Ambulatory Visit (HOSPITAL_COMMUNITY)
Admission: EM | Admit: 2020-10-16 | Discharge: 2020-10-16 | Disposition: A | Discharge status: Home / Self Care | Payer: Medicare Other | Attending: Family Medicine | Admitting: Family Medicine

## 2020-10-16 DIAGNOSIS — R079 Chest pain, unspecified: Secondary | ICD-10-CM

## 2020-10-16 DIAGNOSIS — Z5321 Procedure and treatment not carried out due to patient leaving prior to being seen by health care provider: Secondary | ICD-10-CM | POA: Diagnosis not present

## 2020-10-16 DIAGNOSIS — R0602 Shortness of breath: Secondary | ICD-10-CM | POA: Diagnosis not present

## 2020-10-16 DIAGNOSIS — F259 Schizoaffective disorder, unspecified: Secondary | ICD-10-CM

## 2020-10-16 DIAGNOSIS — F419 Anxiety disorder, unspecified: Secondary | ICD-10-CM | POA: Diagnosis not present

## 2020-10-16 LAB — BASIC METABOLIC PANEL
Anion gap: 10 (ref 5–15)
BUN: 11 mg/dL (ref 6–20)
CO2: 25 mmol/L (ref 22–32)
Calcium: 9.5 mg/dL (ref 8.9–10.3)
Chloride: 103 mmol/L (ref 98–111)
Creatinine, Ser: 1.13 mg/dL (ref 0.61–1.24)
GFR, Estimated: >60 mL/min (ref >60)
Glucose, Bld: 101 mg/dL — ABNORMAL HIGH (ref 70–99)
Potassium: 3.7 mmol/L (ref 3.5–5.1)
Sodium: 138 mmol/L (ref 135–145)

## 2020-10-16 LAB — CBC
HCT: 41.2 % (ref 39.0–52.0)
Hemoglobin: 13.9 g/dL (ref 13.0–17.0)
MCH: 30.5 pg (ref 26.0–34.0)
MCHC: 33.7 g/dL (ref 30.0–36.0)
MCV: 90.4 fL (ref 80.0–100.0)
Platelets: 298 10*3/uL (ref 150–400)
RBC: 4.56 MIL/uL (ref 4.22–5.81)
RDW: 13.2 % (ref 11.5–15.5)
WBC: 7.3 10*3/uL (ref 4.0–10.5)
nRBC: 0 % (ref 0.0–0.2)

## 2020-10-16 LAB — TROPONIN I (HIGH SENSITIVITY): Troponin I (High Sensitivity): 6 ng/L (ref <18)

## 2020-10-16 NOTE — ED Triage Notes (Signed)
Pt reports chest pain for the past few months, states "it feels like I dont have a heart". Pt very anxious in triage. Sent from [facility] for further eval.

## 2020-10-16 NOTE — ED Notes (Signed)
Pt stated they wanted to leave. Pt was advised to stay but pt left anyway.

## 2020-10-16 NOTE — ED Provider Notes (Signed)
[facility]    CSN: [id number] Arrival date & time: 10/16/20  1059      History   Chief Complaint Chief Complaint  Patient presents with  . Chest Pain  . Shortness of Breath    HPI Austin Gutierrez is a 39 y.o. male.   Patient presenting today with 1 month hx of chest pain, intermittent SOB, strange pulsations in both arms, and an overwhelming anxiety regarding his health. He is afraid that his heart is too small or too large. He states over and over that he "just knows he is going to die" and that he is already dead. He has not felt well enough in the past month to take his medications, including his haldol, cogentin, depakote and restoril. Supposed to establish with a PCP tomorrow per patient but could not wait that long. He denies fever, N/V/D, abdominal pain, difficulty breathing, syncope, dizziness. Per chart review, has a hx of schizoaffective disorder and tobacco and marijuana use but otherwise no known chronic medical issues.      Past Medical History:  Diagnosis Date  . Asthma   . Headache, common migraine   . Schizoaffective disorder [facility])     Patient Active Problem List   Diagnosis Date Noted  . Schizoaffective disorder (HCC) 02/22/2020  . Tobacco use disorder 05/15/2016  . Schizoaffective disorder, bipolar type (HCC) 05/15/2016    Past Surgical History:  Procedure Laterality Date  . MOUTH SURGERY         Home Medications    Prior to Admission medications   Medication Sig Start Date End Date Taking? Authorizing Provider  benztropine (COGENTIN) 1 MG tablet Take 1 tablet (1 mg total) by mouth 2 (two) times daily. 02/28/20   [name], MD  cyclobenzaprine (FLEXERIL) 10 MG tablet Take 1 tablet (10 mg total) by mouth 3 (three) times daily as needed for muscle spasms (or pain). Patient not taking: Reported on 11/08/2017 08/18/16   Trixie Dredge, PA-C  divalproex (DEPAKOTE) 250 MG DR tablet Take 3 tablets (750 mg total) by mouth at bedtime. 02/28/20    [name], MD  haloperidol (HALDOL) 10 MG tablet Take 1 tablet (10 mg total) by mouth at bedtime. 02/28/20   [name], MD  haloperidol decanoate (HALDOL DECANOATE) 100 MG/ML injection Inject 1.5 mLs (150 mg total) into the muscle every 30 (thirty) days. Due 4/10 03/26/20   [name], MD  ibuprofen (ADVIL,MOTRIN) 600 MG tablet Take 1 tablet (600 mg total) by mouth every 6 (six) hours as needed. Patient not taking: Reported on 11/08/2017 09/18/17   Dartha Lodge, PA-C  temazepam (RESTORIL) 30 MG capsule Take 1 capsule (30 mg total) by mouth at bedtime. 02/28/20   [name], MD    Family History Family History  Problem Relation Age of Onset  . Depression Other   . Hypertension Mother     Social History Social History   Tobacco Use  . Smoking status: Heavy Tobacco Smoker    Packs/day: 1.00    Types: Cigarettes  . Smokeless tobacco: Never Used  Vaping Use  . Vaping Use: Never used  Substance Use Topics  . Alcohol use: Not Currently    Alcohol/week: 1.0 standard drink    Types: 1 Shots of liquor per week    Comment: denies regular alcohol use  . Drug use: Yes    Types: Marijuana    Comment: "when i have it"     Allergies   Mushroom extract complex, Excedrin back & [  acetaminophen-aspirin buffered], Fish allergy, and Onion   Review of Systems Review of Systems PER HPI    Physical Exam Triage Vital Signs ED Triage Vitals  Enc Vitals Group     BP 10/16/20 1117 (!) 164/107     Pulse Rate 10/16/20 1117 90     Resp 10/16/20 1117 (!) 21     Temp 10/16/20 1117 98.4 F (36.9 C)     Temp Source 10/16/20 1117 Oral     SpO2 10/16/20 1117 100 %     Weight --      Height --      Head Circumference --      Peak Flow --      Pain Score 10/16/20 1118 6     Pain Loc --      Pain Edu? --      Excl. in GC? --    No data found.  Updated Vital Signs BP (!) 164/107 (BP Location: Right Arm)   Pulse 90   Temp 98.4 F (36.9 C) (Oral)   Resp (!) 21   SpO2 100%    Visual Acuity Right Eye Distance:   Left Eye Distance:   Bilateral Distance:    Right Eye Near:   Left Eye Near:    Bilateral Near:     Physical Exam Vitals and nursing note reviewed.  Constitutional:      Comments: Appears very anxious  HENT:     Head: Atraumatic.     Nose: Nose normal.     Mouth/Throat:     Mouth: Mucous membranes are moist.     Pharynx: Oropharynx is clear.  Eyes:     Extraocular Movements: Extraocular movements intact.     Conjunctiva/sclera: Conjunctivae normal.  Cardiovascular:     Rate and Rhythm: Normal rate and regular rhythm.     Heart sounds: Normal heart sounds.  Pulmonary:     Effort: Pulmonary effort is normal. No respiratory distress.     Breath sounds: Normal breath sounds. No wheezing or rales.  Musculoskeletal:        General: Normal range of motion.     Cervical back: Normal range of motion and neck supple.  Skin:    General: Skin is warm and dry.     Findings: No rash.  Neurological:     Mental Status: He is alert.     Motor: No weakness.     Gait: Gait normal.     Comments: Unclear what baseline mental status is, speaking in full sentences, thoughts a bit disorganized.   Psychiatric:     Comments: Anxious affect, thoughts a bit disorganized    UC Treatments / Results  Labs (all labs ordered are listed, but only abnormal results are displayed) Labs Reviewed - No data to display  EKG   Radiology No results found.  Procedures Procedures (including critical care time)  Medications Ordered in UC Medications - No data to display  Initial Impression / Assessment and Plan / UC Course  I have reviewed the triage vital signs and the nursing notes.  Pertinent labs & imaging results that were available during my care of the patient were reviewed by me and considered in my medical decision making (see chart for details).     Strong suspicion that his sxs today are driven by poorly controlled psychiatric conditions,  particularly in the setting of being off his medications x 1 month now. EKG today reassuring and unchanged from prior, no ST or T wave changes  suggesting ischemia and no rhythm abnormalities. His vitals are notable for mildly elevated resp rate and blood pressure likely secondary to anxiousness. He is requesting imaging on his heart and continues to repeat that he thinks he's going to die. Discussed at length that he would benefit from a larger workup/observation in the ED and possibly getting some medications like the ones he is prescribed going. He is agreeable to this and has an aunt in the parking lot he wishes to drive him over. Declines EMS transport.   Final Clinical Impressions(s) / UC Diagnoses   Final diagnoses:  Chest pain, unspecified type  Shortness of breath  Schizoaffective disorder, unspecified type [facility])   Discharge Instructions   None    ED Prescriptions    None     PDMP not reviewed this encounter.   Particia Nearing, [place] 10/16/20 1207

## 2020-10-16 NOTE — ED Notes (Signed)
Patient stated that he was leaving.

## 2020-10-16 NOTE — ED Notes (Signed)
Provider notified of EKG results and patient status.

## 2020-10-16 NOTE — ED Triage Notes (Addendum)
Pt c/o chest pain x 1 month. Pt states somethimes he is sob. Pt states he has not taken any medication in about a month due to "not feeling well". He states his heart feels "small, like it's not there" Pt states his left arm feels like it is throbbing.

## 2020-10-18 DIAGNOSIS — F200 Paranoid schizophrenia: Secondary | ICD-10-CM | POA: Diagnosis not present

## 2020-10-18 DIAGNOSIS — E7800 Pure hypercholesterolemia, unspecified: Secondary | ICD-10-CM | POA: Diagnosis not present

## 2020-10-18 DIAGNOSIS — R7303 Prediabetes: Secondary | ICD-10-CM | POA: Diagnosis not present

## 2020-11-04 ENCOUNTER — Emergency Department (HOSPITAL_COMMUNITY): Payer: Medicare Other

## 2020-11-04 ENCOUNTER — Other Ambulatory Visit: Payer: Self-pay

## 2020-11-04 ENCOUNTER — Encounter (HOSPITAL_COMMUNITY): Payer: Self-pay | Admitting: Emergency Medicine

## 2020-11-04 ENCOUNTER — Emergency Department (HOSPITAL_COMMUNITY)
Admission: EM | Admit: 2020-11-04 | Discharge: 2020-11-04 | Disposition: A | Discharge status: Home / Self Care | Payer: Medicare Other | Attending: Emergency Medicine | Admitting: Emergency Medicine

## 2020-11-04 DIAGNOSIS — Z20822 Contact with and (suspected) exposure to covid-19: Secondary | ICD-10-CM | POA: Diagnosis not present

## 2020-11-04 DIAGNOSIS — R1032 Left lower quadrant pain: Secondary | ICD-10-CM | POA: Insufficient documentation

## 2020-11-04 DIAGNOSIS — R109 Unspecified abdominal pain: Secondary | ICD-10-CM

## 2020-11-04 DIAGNOSIS — R52 Pain, unspecified: Secondary | ICD-10-CM | POA: Diagnosis not present

## 2020-11-04 DIAGNOSIS — F17210 Nicotine dependence, cigarettes, uncomplicated: Secondary | ICD-10-CM | POA: Diagnosis not present

## 2020-11-04 DIAGNOSIS — R1084 Generalized abdominal pain: Secondary | ICD-10-CM | POA: Diagnosis not present

## 2020-11-04 DIAGNOSIS — I10 Essential (primary) hypertension: Secondary | ICD-10-CM | POA: Diagnosis not present

## 2020-11-04 DIAGNOSIS — K5900 Constipation, unspecified: Secondary | ICD-10-CM

## 2020-11-04 DIAGNOSIS — R0789 Other chest pain: Secondary | ICD-10-CM | POA: Diagnosis not present

## 2020-11-04 DIAGNOSIS — J45909 Unspecified asthma, uncomplicated: Secondary | ICD-10-CM | POA: Insufficient documentation

## 2020-11-04 DIAGNOSIS — R069 Unspecified abnormalities of breathing: Secondary | ICD-10-CM | POA: Diagnosis not present

## 2020-11-04 LAB — CBC
HCT: 41.3 % (ref 39.0–52.0)
Hemoglobin: 14.2 g/dL (ref 13.0–17.0)
MCH: 30.5 pg (ref 26.0–34.0)
MCHC: 34.4 g/dL (ref 30.0–36.0)
MCV: 88.6 fL (ref 80.0–100.0)
Platelets: 291 10*3/uL (ref 150–400)
RBC: 4.66 MIL/uL (ref 4.22–5.81)
RDW: 13.2 % (ref 11.5–15.5)
WBC: 6.3 10*3/uL (ref 4.0–10.5)
nRBC: 0 % (ref 0.0–0.2)

## 2020-11-04 LAB — COMPREHENSIVE METABOLIC PANEL
ALT: 16 U/L (ref 0–44)
AST: 18 U/L (ref 15–41)
Albumin: 3.8 g/dL (ref 3.5–5.0)
Alkaline Phosphatase: 40 U/L (ref 38–126)
Anion gap: 9 (ref 5–15)
BUN: 10 mg/dL (ref 6–20)
CO2: 26 mmol/L (ref 22–32)
Calcium: 9.7 mg/dL (ref 8.9–10.3)
Chloride: 102 mmol/L (ref 98–111)
Creatinine, Ser: 1.16 mg/dL (ref 0.61–1.24)
GFR, Estimated: >60 mL/min (ref >60)
Glucose, Bld: 107 mg/dL — ABNORMAL HIGH (ref 70–99)
Potassium: 3.5 mmol/L (ref 3.5–5.1)
Sodium: 137 mmol/L (ref 135–145)
Total Bilirubin: 0.6 mg/dL (ref 0.3–1.2)
Total Protein: 7.1 g/dL (ref 6.5–8.1)

## 2020-11-04 LAB — RESPIRATORY PANEL BY RT PCR (FLU A&B, COVID)
Influenza A by PCR: NEGATIVE
Influenza B by PCR: NEGATIVE
SARS Coronavirus 2 by RT PCR: NEGATIVE

## 2020-11-04 LAB — VALPROIC ACID LEVEL: Valproic Acid Lvl: <10 ug/mL — ABNORMAL LOW (ref 50.0–100.0)

## 2020-11-04 LAB — LIPASE, BLOOD: Lipase: 25 U/L (ref 11–51)

## 2020-11-04 MED ORDER — POLYETHYLENE GLYCOL 3350 17 G PO PACK: 17.0000 g | PACK | Freq: Every day | ORAL | 0 refills | Status: AC

## 2020-11-04 MED ORDER — LORAZEPAM 2 MG/ML IJ SOLN
1.0000 mg | Freq: Once | INTRAMUSCULAR | Status: AC
  Administered 2020-11-04: 1 mg via INTRAVENOUS
  Filled 2020-11-04: qty 1

## 2020-11-04 MED ORDER — SODIUM CHLORIDE 0.9 % IV BOLUS
1000.0000 mL | Freq: Once | INTRAVENOUS | Status: AC
  Administered 2020-11-04: 1000 mL via INTRAVENOUS

## 2020-11-04 MED ORDER — KETOROLAC TROMETHAMINE 30 MG/ML IJ SOLN
30.0000 mg | Freq: Once | INTRAMUSCULAR | Status: AC
  Administered 2020-11-04: 30 mg via INTRAVENOUS
  Filled 2020-11-04: qty 1

## 2020-11-04 NOTE — ED Provider Notes (Signed)
[facility] EMERGENCY DEPARTMENT Provider Note   CSN: [id number] Arrival date & time: 11/04/20  1237     History Chief Complaint  Patient presents with  . Abdominal Pain    Austin Gutierrez is a 39 y.o. male history of asthma, schizoaffective disorder, here presenting with chest pain abdominal pain. Patient is very vague about when it is happening. He states it has been going on for weeks. He initially came in for left lower quadrant pain but then he told me that his chest is also hurting. Patient also is constipated. Patient has not been taking his psych meds and he states that he is not anxious but his mother commented that he is very anxious. Patient is seen pacing in the hallway and worried about his symptoms. Patient denies any suicidal homicidal ideations. Patient has psychiatry follow-up next week.  The history is provided by the patient.       Past Medical History:  Diagnosis Date  . Asthma   . Headache, common migraine   . Schizoaffective disorder [facility])     Patient Active Problem List   Diagnosis Date Noted  . Schizoaffective disorder (HCC) 02/22/2020  . Tobacco use disorder 05/15/2016  . Schizoaffective disorder, bipolar type (HCC) 05/15/2016    Past Surgical History:  Procedure Laterality Date  . MOUTH SURGERY         Family History  Problem Relation Age of Onset  . Depression Other   . Hypertension Mother     Social History   Tobacco Use  . Smoking status: Heavy Tobacco Smoker    Packs/day: 1.00    Types: Cigarettes  . Smokeless tobacco: Never Used  Vaping Use  . Vaping Use: Never used  Substance Use Topics  . Alcohol use: Not Currently    Alcohol/week: 1.0 standard drink    Types: 1 Shots of liquor per week    Comment: denies regular alcohol use  . Drug use: Yes    Types: Marijuana    Comment: "when i have it"    Home Medications Prior to Admission medications   Medication Sig Start Date End Date Taking? Authorizing  Provider  benztropine (COGENTIN) 1 MG tablet Take 1 tablet (1 mg total) by mouth 2 (two) times daily. 02/28/20   [name], MD  cyclobenzaprine (FLEXERIL) 10 MG tablet Take 1 tablet (10 mg total) by mouth 3 (three) times daily as needed for muscle spasms (or pain). Patient not taking: Reported on 11/08/2017 08/18/16   Trixie Dredge, PA-C  divalproex (DEPAKOTE) 250 MG DR tablet Take 3 tablets (750 mg total) by mouth at bedtime. 02/28/20   [name], MD  haloperidol (HALDOL) 10 MG tablet Take 1 tablet (10 mg total) by mouth at bedtime. 02/28/20   [name], MD  haloperidol decanoate (HALDOL DECANOATE) 100 MG/ML injection Inject 1.5 mLs (150 mg total) into the muscle every 30 (thirty) days. Due 4/10 03/26/20   [name], MD  ibuprofen (ADVIL,MOTRIN) 600 MG tablet Take 1 tablet (600 mg total) by mouth every 6 (six) hours as needed. Patient not taking: Reported on 11/08/2017 09/18/17   Dartha Lodge, PA-C  temazepam (RESTORIL) 30 MG capsule Take 1 capsule (30 mg total) by mouth at bedtime. 02/28/20   [name], MD    Allergies    Mushroom extract complex, Excedrin back & [acetaminophen-aspirin buffered], Fish allergy, and Onion  Review of Systems   Review of Systems  Gastrointestinal: Positive for abdominal pain.  All other systems reviewed and  are negative.   Physical Exam Updated Vital Signs BP (!) 169/97   Pulse 60   Temp 98.1 F (36.7 C) (Oral)   Resp 20   Ht 6\' 1"  (1.854 m)   Wt 79.4 kg   SpO2 100%   BMI 23.09 kg/m   Physical Exam Vitals and nursing note reviewed.  Constitutional:      Comments: Anxious, pacing   HENT:     Head: Normocephalic.     Mouth/Throat:     Mouth: Mucous membranes are moist.  Eyes:     Extraocular Movements: Extraocular movements intact.     Pupils: Pupils are equal, round, and reactive to light.  Cardiovascular:     Rate and Rhythm: Normal rate and regular rhythm.     Heart sounds: Normal heart sounds.  Pulmonary:     Effort: Pulmonary  effort is normal.     Breath sounds: Normal breath sounds.  Abdominal:     General: Abdomen is flat.     Comments: Mild epigastric tenderness   Skin:    General: Skin is warm.     Capillary Refill: Capillary refill takes less than 2 seconds.  Neurological:     General: No focal deficit present.  Psychiatric:     Comments: Anxious      ED Results / Procedures / Treatments   Labs (all labs ordered are listed, but only abnormal results are displayed) Labs Reviewed  COMPREHENSIVE METABOLIC PANEL - Abnormal; Notable for the following components:      Result Value   Glucose, Bld 107 (*)    All other components within normal limits  VALPROIC ACID LEVEL - Abnormal; Notable for the following components:   Valproic Acid Lvl <10 (*)    All other components within normal limits  RESPIRATORY PANEL BY RT PCR (FLU A&B, COVID)  LIPASE, BLOOD  CBC  URINALYSIS, ROUTINE W REFLEX MICROSCOPIC    EKG EKG Interpretation  Date/Time:  Saturday November 04 2020 16:25:08 EST Ventricular Rate:  66 PR Interval:  182 QRS Duration: 82 QT Interval:  392 QTC Calculation: 410 R Axis:   82 Text Interpretation: Normal sinus rhythm Normal ECG No significant change since last tracing Confirmed by [id number] [phone]) on 11/04/2020 5:23:01 PM   Radiology DG ABD ACUTE 2+V W 1V CHEST  Result Date: 11/04/2020 CLINICAL DATA:  Abdominal pain.  LEFT lower quadrant pain EXAM: DG ABDOMEN ACUTE WITH 1 VIEW CHEST COMPARISON:  None. FINDINGS: Normal mediastinum and cardiac silhouette. Normal pulmonary vasculature. No evidence of effusion, infiltrate, or pneumothorax. No acute bony abnormality. No dilated loops of large or small bowel. Gas and stool in the rectum. No pathologic calcifications. No organomegaly. No acute osseous abnormality IMPRESSION: 1. No acute cardiopulmonary process. 2. No bowel obstruction. Electronically Signed   By: 11/06/2020 M.D.   On: 11/04/2020 16:47    Procedures Procedures  (including critical care time)  Medications Ordered in ED Medications  LORazepam (ATIVAN) injection 1 mg (1 mg Intravenous Given 11/04/20 1605)  ketorolac (TORADOL) 30 MG/ML injection 30 mg (30 mg Intravenous Given 11/04/20 1606)  sodium chloride 0.9 % bolus 1,000 mL (0 mLs Intravenous Stopped 11/04/20 1734)    ED Course  I have reviewed the triage vital signs and the nursing notes.  Pertinent labs & imaging results that were available during my care of the patient were reviewed by me and considered in my medical decision making (see chart for details).    MDM Rules/Calculators/A&P  Austin Gutierrez is a 39 y.o. male here with abdominal pain and chest pain and anxiety. Symptoms going on for several weeks. He initially told the nurse he had left lower quadrant pain. Patient told me that he has chest pain as well. Patient is here is very anxious. He denies any suicidal homicidal ideation and has psych follow-up in several days. Plan to get labs and x-ray of the abdomen and chest. I doubt any emergent process. I think a lot of symptoms are secondary to anxiety. We will give some Ativan for anxiety as well.  5:41 PM Felt better after IV fluids and Toradol and Ativan.  Labs unremarkable.  His x-ray showed constipation but otherwise unremarkable.  EKG unremarkable.  His Depakote level is undetectable.  I think his symptoms likely from anxiety and constipation.  I encouraged him to take his psych medicines especially Depakote.  He has psychiatry appointment on Tuesday.   Final Clinical Impression(s) / ED Diagnoses Final diagnoses:  None    Rx / DC Orders ED Discharge Orders    None       Charlynne Pander, MD 11/04/20 1742

## 2020-11-04 NOTE — ED Triage Notes (Signed)
Pt arrives to ED via [facility] with complaints of LLQ abdominal pain. Pain for "weeks." Pt described pain as a gaseous pain. Pt states he is not taking his medicine and feels anxious. States "I don't wanna die." Pt anxious in triage. Pt not taking psych medications.

## 2020-11-04 NOTE — ED Notes (Signed)
Pt d/c home per MD order. Discharge summary reviewed with pt, pt verbalizes understanding. No s/s of acute distress noted at discharge. Ambulatory off unit. Discharged home with mom

## 2020-11-04 NOTE — Discharge Instructions (Signed)
You have constipation so please take MiraLAX daily and stay hydrated.  You need to start taking your Depakote again as well as your other psychiatric medicines.  Please see your psychiatrist as scheduled.  Also see your primary care doctor for constipation.    Return to ER if you have worse abdominal pain, chest pain, thoughts of harming yourself or others, hallucinations.

## 2020-11-07 DIAGNOSIS — F4310 Post-traumatic stress disorder, unspecified: Secondary | ICD-10-CM | POA: Diagnosis not present

## 2020-11-07 DIAGNOSIS — F17200 Nicotine dependence, unspecified, uncomplicated: Secondary | ICD-10-CM | POA: Diagnosis not present

## 2020-11-07 DIAGNOSIS — F250 Schizoaffective disorder, bipolar type: Secondary | ICD-10-CM | POA: Diagnosis not present

## 2020-12-05 DIAGNOSIS — F250 Schizoaffective disorder, bipolar type: Secondary | ICD-10-CM | POA: Diagnosis not present

## 2021-01-02 DIAGNOSIS — F250 Schizoaffective disorder, bipolar type: Secondary | ICD-10-CM | POA: Diagnosis not present

## 2021-01-19 DIAGNOSIS — E7800 Pure hypercholesterolemia, unspecified: Secondary | ICD-10-CM | POA: Diagnosis not present

## 2021-01-19 DIAGNOSIS — R7303 Prediabetes: Secondary | ICD-10-CM | POA: Diagnosis not present

## 2021-01-19 DIAGNOSIS — Z79899 Other long term (current) drug therapy: Secondary | ICD-10-CM | POA: Diagnosis not present

## 2021-01-30 DIAGNOSIS — F250 Schizoaffective disorder, bipolar type: Secondary | ICD-10-CM | POA: Diagnosis not present

## 2021-02-27 DIAGNOSIS — F250 Schizoaffective disorder, bipolar type: Secondary | ICD-10-CM | POA: Diagnosis not present

## 2021-03-27 DIAGNOSIS — F250 Schizoaffective disorder, bipolar type: Secondary | ICD-10-CM | POA: Diagnosis not present

## 2021-04-06 DIAGNOSIS — G43009 Migraine without aura, not intractable, without status migrainosus: Secondary | ICD-10-CM | POA: Diagnosis not present

## 2021-04-06 DIAGNOSIS — R7303 Prediabetes: Secondary | ICD-10-CM | POA: Diagnosis not present

## 2021-04-06 DIAGNOSIS — E7800 Pure hypercholesterolemia, unspecified: Secondary | ICD-10-CM | POA: Diagnosis not present

## 2021-04-20 DIAGNOSIS — G43009 Migraine without aura, not intractable, without status migrainosus: Secondary | ICD-10-CM | POA: Diagnosis not present

## 2021-04-20 DIAGNOSIS — I10 Essential (primary) hypertension: Secondary | ICD-10-CM | POA: Diagnosis not present

## 2021-04-24 DIAGNOSIS — F250 Schizoaffective disorder, bipolar type: Secondary | ICD-10-CM | POA: Diagnosis not present

## 2021-05-18 DIAGNOSIS — G43009 Migraine without aura, not intractable, without status migrainosus: Secondary | ICD-10-CM | POA: Diagnosis not present

## 2021-05-18 DIAGNOSIS — Z1331 Encounter for screening for depression: Secondary | ICD-10-CM | POA: Diagnosis not present

## 2021-05-18 DIAGNOSIS — R945 Abnormal results of liver function studies: Secondary | ICD-10-CM | POA: Diagnosis not present

## 2021-05-18 DIAGNOSIS — Z1339 Encounter for screening examination for other mental health and behavioral disorders: Secondary | ICD-10-CM | POA: Diagnosis not present

## 2021-05-18 DIAGNOSIS — I10 Essential (primary) hypertension: Secondary | ICD-10-CM | POA: Diagnosis not present

## 2021-05-22 DIAGNOSIS — F250 Schizoaffective disorder, bipolar type: Secondary | ICD-10-CM | POA: Diagnosis not present

## 2021-06-07 ENCOUNTER — Encounter (HOSPITAL_COMMUNITY): Payer: Self-pay

## 2021-06-07 ENCOUNTER — Emergency Department (HOSPITAL_COMMUNITY): Payer: Medicare Other

## 2021-06-07 ENCOUNTER — Other Ambulatory Visit: Payer: Self-pay

## 2021-06-07 ENCOUNTER — Emergency Department (HOSPITAL_COMMUNITY)
Admission: EM | Admit: 2021-06-07 | Discharge: 2021-06-07 | Disposition: A | Discharge status: Home / Self Care | Payer: Medicare Other | Attending: Emergency Medicine | Admitting: Emergency Medicine

## 2021-06-07 DIAGNOSIS — J45909 Unspecified asthma, uncomplicated: Secondary | ICD-10-CM | POA: Diagnosis not present

## 2021-06-07 DIAGNOSIS — R400 Somnolence: Secondary | ICD-10-CM | POA: Diagnosis not present

## 2021-06-07 DIAGNOSIS — F17210 Nicotine dependence, cigarettes, uncomplicated: Secondary | ICD-10-CM | POA: Diagnosis not present

## 2021-06-07 DIAGNOSIS — W19XXXA Unspecified fall, initial encounter: Secondary | ICD-10-CM | POA: Diagnosis not present

## 2021-06-07 DIAGNOSIS — R569 Unspecified convulsions: Secondary | ICD-10-CM | POA: Diagnosis not present

## 2021-06-07 DIAGNOSIS — R0902 Hypoxemia: Secondary | ICD-10-CM | POA: Diagnosis not present

## 2021-06-07 LAB — COMPREHENSIVE METABOLIC PANEL
ALT: 14 U/L (ref 0–44)
AST: 16 U/L (ref 15–41)
Albumin: 3.3 g/dL — ABNORMAL LOW (ref 3.5–5.0)
Alkaline Phosphatase: 45 U/L (ref 38–126)
Anion gap: 6 (ref 5–15)
BUN: 17 mg/dL (ref 6–20)
CO2: 27 mmol/L (ref 22–32)
Calcium: 8.9 mg/dL (ref 8.9–10.3)
Chloride: 106 mmol/L (ref 98–111)
Creatinine, Ser: 1.37 mg/dL — ABNORMAL HIGH (ref 0.61–1.24)
GFR, Estimated: >60 mL/min (ref >60)
Glucose, Bld: 164 mg/dL — ABNORMAL HIGH (ref 70–99)
Potassium: 3.5 mmol/L (ref 3.5–5.1)
Sodium: 139 mmol/L (ref 135–145)
Total Bilirubin: 0.3 mg/dL (ref 0.3–1.2)
Total Protein: 6.2 g/dL — ABNORMAL LOW (ref 6.5–8.1)

## 2021-06-07 LAB — RAPID URINE DRUG SCREEN, HOSP PERFORMED
Amphetamines: NOT DETECTED
Barbiturates: NOT DETECTED
Benzodiazepines: NOT DETECTED
Cocaine: NOT DETECTED
Opiates: NOT DETECTED
Tetrahydrocannabinol: POSITIVE — AB

## 2021-06-07 LAB — CBC WITH DIFFERENTIAL/PLATELET
Abs Immature Granulocytes: 0.02 10*3/uL (ref 0.00–0.07)
Basophils Absolute: 0.1 10*3/uL (ref 0.0–0.1)
Basophils Relative: 1 %
Eosinophils Absolute: 0.1 10*3/uL (ref 0.0–0.5)
Eosinophils Relative: 2 %
HCT: 38.3 % — ABNORMAL LOW (ref 39.0–52.0)
Hemoglobin: 13.3 g/dL (ref 13.0–17.0)
Immature Granulocytes: 0 %
Lymphocytes Relative: 15 %
Lymphs Abs: 1 10*3/uL (ref 0.7–4.0)
MCH: 30 pg (ref 26.0–34.0)
MCHC: 34.7 g/dL (ref 30.0–36.0)
MCV: 86.5 fL (ref 80.0–100.0)
Monocytes Absolute: 0.4 10*3/uL (ref 0.1–1.0)
Monocytes Relative: 6 %
Neutro Abs: 5 10*3/uL (ref 1.7–7.7)
Neutrophils Relative %: 76 %
Platelets: 254 10*3/uL (ref 150–400)
RBC: 4.43 MIL/uL (ref 4.22–5.81)
RDW: 12.9 % (ref 11.5–15.5)
WBC: 6.7 10*3/uL (ref 4.0–10.5)
nRBC: 0 % (ref 0.0–0.2)

## 2021-06-07 LAB — CBG MONITORING, ED: Glucose-Capillary: 177 mg/dL — ABNORMAL HIGH (ref 70–99)

## 2021-06-07 LAB — ETHANOL: Alcohol, Ethyl (B): <10 mg/dL (ref <10)

## 2021-06-07 LAB — MAGNESIUM: Magnesium: 1.7 mg/dL (ref 1.7–2.4)

## 2021-06-07 LAB — VALPROIC ACID LEVEL: Valproic Acid Lvl: <10 ug/mL — ABNORMAL LOW (ref 50.0–100.0)

## 2021-06-07 MED ORDER — DIVALPROEX SODIUM 500 MG PO DR TAB: 500.0000 mg | DELAYED_RELEASE_TABLET | Freq: Two times a day (BID) | ORAL | 2 refills | Status: DC

## 2021-06-07 MED ORDER — DIVALPROEX SODIUM 250 MG PO DR TAB
500.0000 mg | DELAYED_RELEASE_TABLET | Freq: Once | ORAL | Status: AC
  Administered 2021-06-07: 500 mg via ORAL
  Filled 2021-06-07: qty 2

## 2021-06-07 NOTE — Discharge Instructions (Signed)
If you do not hear from the neurology office by Monday, please call the number above to schedule an appointment for your seizures.  We will start taking the Depakote medication twice a day beginning tomorrow morning.  This to help to prevent further seizures.  You should absolutely not drive a car, go swimming, operate heavy machinery for at least 6 months, or until you are cleared to go back by a neurologist.  Please avoid smoking marijuana or using any other street drugs.  These can be triggers for seizures.

## 2021-06-07 NOTE — ED Provider Notes (Signed)
[facility] EMERGENCY DEPARTMENT Provider Note   CSN: [id number] Arrival date & time: 06/07/21  1447     History Chief Complaint  Patient presents with   Seizures    Linken Mcglothen is a 40 y.o. male.  The history is provided by the patient, medical records and the EMS personnel.  Seizures Garrell Flagg is a 40 y.o. male who presents to the Emergency Department complaining of seizure. Level V caveat due to confusion. History is provided by EMS. He presents the emergency department by EMS after having what is described as a full-body seizure. He was walking when he developed seizure activity and fell to the ground. He had approximately two minutes of generalized activity. Report is he was postictal on scene. No loss of bowel or bladder. On ED arrival patient complains of body pain. He remembers walking but does not remember other things that occurred. He does have a history of seizure as a baby. He has a history of schizoaffective disorder. He is unsure what his medications are. He states that his unusual for him to not know his medications. He uses occasional tobacco, no alcohol. He uses marijuana.    Past Medical History:  Diagnosis Date   Asthma    Headache, common migraine    Schizoaffective disorder [facility])     Patient Active Problem List   Diagnosis Date Noted   Schizoaffective disorder (HCC) 02/22/2020   Tobacco use disorder 05/15/2016   Schizoaffective disorder, bipolar type (HCC) 05/15/2016    Past Surgical History:  Procedure Laterality Date   MOUTH SURGERY         Family History  Problem Relation Age of Onset   Depression Other    Hypertension Mother     Social History   Tobacco Use   Smoking status: Some Days    Packs/day: 1.00    Pack years: 0.00    Types: Cigarettes   Smokeless tobacco: Never  Vaping Use   Vaping Use: Never used  Substance Use Topics   Alcohol use: Not Currently    Alcohol/week: 1.0 standard drink    Types: 1  Shots of liquor per week    Comment: denies regular alcohol use   Drug use: Yes    Types: Marijuana    Comment: "when i have it"    Home Medications Prior to Admission medications   Medication Sig Start Date End Date Taking? Authorizing Provider  benztropine (COGENTIN) 1 MG tablet Take 1 tablet (1 mg total) by mouth 2 (two) times daily. 02/28/20   [name], MD  cyclobenzaprine (FLEXERIL) 10 MG tablet Take 1 tablet (10 mg total) by mouth 3 (three) times daily as needed for muscle spasms (or pain). Patient not taking: Reported on 11/08/2017 08/18/16   Trixie Dredge, PA-C  divalproex (DEPAKOTE) 250 MG DR tablet Take 3 tablets (750 mg total) by mouth at bedtime. 02/28/20   [name], MD  haloperidol (HALDOL) 10 MG tablet Take 1 tablet (10 mg total) by mouth at bedtime. 02/28/20   [name], MD  haloperidol decanoate (HALDOL DECANOATE) 100 MG/ML injection Inject 1.5 mLs (150 mg total) into the muscle every 30 (thirty) days. Due 4/10 03/26/20   [name], MD  ibuprofen (ADVIL,MOTRIN) 600 MG tablet Take 1 tablet (600 mg total) by mouth every 6 (six) hours as needed. Patient not taking: Reported on 11/08/2017 09/18/17   Dartha Lodge, PA-C  polyethylene glycol (MIRALAX / GLYCOLAX) 17 g packet Take 17 g by mouth daily. 11/04/20  Charlynne Pander, MD  temazepam (RESTORIL) 30 MG capsule Take 1 capsule (30 mg total) by mouth at bedtime. 02/28/20   [name], MD    Allergies    Mushroom extract complex, Excedrin back & [acetaminophen-aspirin buffered], Fish allergy, and Onion  Review of Systems   Review of Systems  Neurological:  Positive for seizures.  All other systems reviewed and are negative.  Physical Exam Updated Vital Signs BP 131/81   Pulse 88   Temp 98.9 F (37.2 C) (Oral)   Resp (!) 25   Ht 6\' 1"  (1.854 m)   Wt 79.4 kg   SpO2 95%   BMI 23.09 kg/m   Physical Exam Vitals and nursing note reviewed.  Constitutional:      Appearance: He is well-developed.  HENT:      Head: Normocephalic and atraumatic.  Cardiovascular:     Rate and Rhythm: Normal rate and regular rhythm.     Heart sounds: No murmur heard. Pulmonary:     Effort: Pulmonary effort is normal. No respiratory distress.     Breath sounds: Normal breath sounds.  Abdominal:     Palpations: Abdomen is soft.     Tenderness: There is no abdominal tenderness. There is no guarding or rebound.  Musculoskeletal:        General: No tenderness.  Skin:    General: Skin is warm and dry.  Neurological:     Mental Status: He is alert and oriented to person, place, and time.     Comments: Five out of five strength in all four extremities with sensation to light touch intact in all four extremities  Psychiatric:        Behavior: Behavior normal.    ED Results / Procedures / Treatments   Labs (all labs ordered are listed, but only abnormal results are displayed) Labs Reviewed  CBG MONITORING, ED - Abnormal; Notable for the following components:      Result Value   Glucose-Capillary 177 (*)    All other components within normal limits  COMPREHENSIVE METABOLIC PANEL  ETHANOL  CBC WITH DIFFERENTIAL/PLATELET  RAPID URINE DRUG SCREEN, HOSP PERFORMED  MAGNESIUM  VALPROIC ACID LEVEL    EKG None  Radiology No results found.  Procedures Procedures   Medications Ordered in ED Medications - No data to display  ED Course  I have reviewed the triage vital signs and the nursing notes.  Pertinent labs & imaging results that were available during my care of the patient were reviewed by me and considered in my medical decision making (see chart for details).    MDM Rules/Calculators/A&P                         patient with history of schizoaffective disorder here for evaluation following witnessed seizure. On examination he is mildly postictal with no focal neurologic deficits. He does have a remote history of seizure as a child. He is not currently or of his medications. Plan to obtain labs,  imaging to further evaluate. Patient care transferred pending reassessment and results.   Final Clinical Impression(s) / ED Diagnoses Final diagnoses:  None    Rx / DC Orders ED Discharge Orders     None        , MD 06/07/21 1628

## 2021-06-07 NOTE — ED Provider Notes (Signed)
40 yo male here with seizures, distant hx of seizures in childhood. Stable here, verbal.    Pending CTH, depakote level  Physical Exam  BP 131/81   Pulse 88   Temp 98.9 F (37.2 C) (Oral)   Resp (!) 25   Ht 6\' 1"  (1.854 m)   Wt 79.4 kg   SpO2 95%   BMI 23.09 kg/m     ED Course/Procedures   Clinical Course as of 06/08/21 0031  Thu Jun 07, 2021  2112 I spoke to Dr Jun 09, 2021 from neurology who advised initiating depakote 500 mg BID, obtaining MRI brain while here, and if unremarkable pt can be referred to neurology for outpatient f/u. [MT]  2214 IMPRESSION: Normal brain MRI for age. No acute intracranial abnormality identified. [MT]    Clinical Course User Index [MT] Connee Ikner, 2215, MD   Patient remained asymptomatic in ED.  Given depakote.  Referral placed to neurology.  Pt advised not to drive for 6 months.  Reviewed discharge instructions with both the patient and his mother who was present for his evaluation.     Kermit Balo, MD 06/08/21 [phone]

## 2021-06-07 NOTE — ED Triage Notes (Signed)
Pt arrived via [facility]. Pt was walking and had a full body seizure. Per EMS, pt hasn't had a seizure since he was a baby. Per EMS pt was post tictal on scene. Per EMS pt c/o full body pain. Pt is A&Ox4.

## 2021-06-07 NOTE — ED Notes (Signed)
Patient transported to CT 

## 2021-06-08 ENCOUNTER — Encounter: Payer: Self-pay | Admitting: Neurology

## 2021-06-19 DIAGNOSIS — F250 Schizoaffective disorder, bipolar type: Secondary | ICD-10-CM | POA: Diagnosis not present

## 2021-06-20 ENCOUNTER — Other Ambulatory Visit: Payer: Self-pay

## 2021-06-20 ENCOUNTER — Ambulatory Visit (INDEPENDENT_AMBULATORY_CARE_PROVIDER_SITE_OTHER): Payer: Medicare Other | Admitting: Neurology

## 2021-06-20 ENCOUNTER — Encounter: Payer: Self-pay | Admitting: Neurology

## 2021-06-20 VITALS — BP 125/91 | HR 78 | Ht 74.0 in | Wt 201.4 lb

## 2021-06-20 DIAGNOSIS — R569 Unspecified convulsions: Secondary | ICD-10-CM

## 2021-06-20 MED ORDER — DIVALPROEX SODIUM 500 MG PO DR TAB: 500.0000 mg | DELAYED_RELEASE_TABLET | Freq: Two times a day (BID) | ORAL | 11 refills | Status: AC

## 2021-06-20 NOTE — Progress Notes (Signed)
NEUROLOGY CONSULTATION NOTE  Austin Gutierrez MRN: [id number] DOB: 22-Sep-1981  Referring provider: Dr. Alvester Chou (ER) Primary care provider: Dr. Littie Deeds  Reason for consult:  seizures  Dear Dr Renaye Rakers:  Thank you for your kind referral of 40 Austin Gutierrez for consultation of the above symptoms. Although his history is well known to you, please allow me to reiterate it for the purpose of our medical record. The patient was accompanied to the clinic by his mother [name] who also provides collateral information. Records and images were personally reviewed where available.   HISTORY OF PRESENT ILLNESS: This is a 40 year old left-handed man with a history of schizoaffective disorder, remote history of seizure in infancy, presenting for evaluation of seizure that occurred last 06/07/21. He was in his usual state of health with his friend. He states she smoked marijuana then they went into a store when he started feeling weird, lightheaded, then woke up on the floor. He recalls speech was slurred, his face hurt from hitting the ground. No tongue bite or incontinence, no focal weakness. He lives with his mother who denies any staring/unresponsive episodes. He had one episode of loss of consciousness/seizure at 4 or 5 months old, and was not on any seizure medication. He denies any gaps in time, olfactory/gustatory hallucinations, deja vu, rising epigastric sensation, focal numbness/tingling/weakness, myoclonic jerks. He was brought to [facility] where CBC, CMP were normal except for mildly elevated at 1.37. EtOH negative, UDS positive for THC. I personally reviewed MRI brain without contrast no acute changes, there was minimal chronic microvascular disease, hippocampi symmetric. He was discharged on Depakote 500mg  BID. He has been seeing his psychiatrist and takes Fluoxetine, Haldol IM, Temazepam, and Cogentin. No recent medication changes. He denies any sleep deprivation or alcohol use. He denies any  headaches ("not anymore"), dizziness, diplopia, dysarthria/dysphagia, neck/back pain, bowel/bladder dysfunction. Mood is okay. No auditory/visual hallucinations, his mother reports he has been doing well. He feels the Depakote has helped with mood as well. He had a normal birth and early development.  There is no history of febrile convulsions, CNS infections such as meningitis/encephalitis, significant traumatic brain injury, neurosurgical procedures, or family history of seizures.   PAST MEDICAL HISTORY: Past Medical History:  Diagnosis Date   Asthma    Headache, common migraine    Schizoaffective disorder (HCC)     PAST SURGICAL HISTORY: Past Surgical History:  Procedure Laterality Date   MOUTH SURGERY      MEDICATIONS: Current Outpatient Medications on File Prior to Visit  Medication Sig Dispense Refill   benztropine (COGENTIN) 1 MG tablet Take 1 tablet (1 mg total) by mouth 2 (two) times daily. 60 tablet 2   cyclobenzaprine (FLEXERIL) 10 MG tablet Take 1 tablet (10 mg total) by mouth 3 (three) times daily as needed for muscle spasms (or pain). (Patient not taking: No sig reported) 10 tablet 0   divalproex (DEPAKOTE) 500 MG DR tablet Take 1 tablet (500 mg total) by mouth 2 (two) times daily. 60 tablet 2   FLUoxetine (PROZAC) 40 MG capsule Take 40 mg by mouth daily.     haloperidol (HALDOL) 10 MG tablet Take 1 tablet (10 mg total) by mouth at bedtime. 30 tablet 1   haloperidol decanoate (HALDOL DECANOATE) 100 MG/ML injection Inject 1.5 mLs (150 mg total) into the muscle every 30 (thirty) days. Due 4/10 1 mL 11   ibuprofen (ADVIL,MOTRIN) 600 MG tablet Take 1 tablet (600 mg total) by mouth every 6 (six) hours as  needed. (Patient not taking: No sig reported) 30 tablet 0   lisinopril-hydrochlorothiazide (ZESTORETIC) 10-12.5 MG tablet Take 1 tablet by mouth daily.     polyethylene glycol (MIRALAX / GLYCOLAX) 17 g packet Take 17 g by mouth daily. 7 each 0   temazepam (RESTORIL) 30 MG  capsule Take 1 capsule (30 mg total) by mouth at bedtime. 30 capsule 0   No current facility-administered medications on file prior to visit.    ALLERGIES: Allergies  Allergen Reactions   Mushroom Extract Complex Nausea And Vomiting and Rash   Excedrin Back & [Acetaminophen-Aspirin Buffered] Nausea And Vomiting   Fish Allergy     "makes me breakout, sick on stomach, migraines too"   Onion Nausea And Vomiting and Other (See Comments)    migraines    FAMILY HISTORY: Family History  Problem Relation Age of Onset   Depression Other    Hypertension Mother     SOCIAL HISTORY: Social History   Socioeconomic History   Marital status: Single    Spouse name: Not on file   Number of children: Not on file   Years of education: Not on file   Highest education level: Not on file  Occupational History   Not on file  Tobacco Use   Smoking status: Some Days    Packs/day: 1.00    Pack years: 0.00    Types: Cigarettes   Smokeless tobacco: Never  Vaping Use   Vaping Use: Never used  Substance and Sexual Activity   Alcohol use: Not Currently    Alcohol/week: 1.0 standard drink    Types: 1 Shots of liquor per week    Comment: denies regular alcohol use   Drug use: Yes    Types: Marijuana    Comment: "when i have it"   Sexual activity: Yes  Other Topics Concern   Not on file  Social History Narrative   Not on file   Social Determinants of Health   Financial Resource Strain: Not on file  Food Insecurity: Not on file  Transportation Needs: Not on file  Physical Activity: Not on file  Stress: Not on file  Social Connections: Not on file  Intimate Partner Violence: Not on file     PHYSICAL EXAM: Vitals:   06/20/21 1312  BP: (!) 125/91  Pulse: 78  SpO2: 98%   General: No acute distress, flat affect with poor eye contact Head:  Normocephalic/atraumatic Skin/Extremities: No rash, no edema Neurological Exam: Mental status: alert and oriented to person, place, and time,  no dysarthria or aphasia, Fund of knowledge is appropriate.  Recent and remote memory are intact, 3/3 delayed recall.  Attention and concentration are normal. Cranial nerves: CN I: not tested CN II: pupils equal, round and reactive to light, visual fields intact CN III, IV, VI:  right esotropia, full range of motion, no nystagmus, no ptosis CN V: facial sensation intact CN VII: upper and lower face symmetric CN VIII: hearing intact to conversation Bulk & Tone: normal, no fasciculations. Motor: 5/5 throughout with no pronator drift. Sensation: intact to light touch, cold, pin, vibration and joint position sense.  No extinction to double simultaneous stimulation.  Romberg test negative Deep Tendon Reflexes: +1 throughout Cerebellar: no incoordination on finger to nose testing Gait: narrow-based and steady, able to tandem walk adequately. Tremor: none   IMPRESSION: This is a 40 year old left-handed man with a history of schizoaffective disorder, remote history of seizure in infancy, presenting for evaluation of convulsive seizure that occurred  last 06/07/21. He has a remote history of one seizure in infancy. No clear epilepsy risk factors, neurological exam normal. MRI brain unremarkable. We discussed doing a 1-hour EEG. He feels Depakote has helped with mood as well, continue Depakote 500mg  BID. We discussed avoidance of seizure triggers, including missing medication, sleep deprivation, alcohol. [place] driving laws were discussed with the patient, and he knows to stop driving after a seizure, until 6 months seizure-free. Follow-up in 6 months, they know to call for any changes.    Thank you for allowing me to participate in the care of this patient. Please do not hesitate to call for any questions or concerns.   , M.D.  CC: Dr. Patrcia Dolly, Dr. Renaye Rakers

## 2021-06-20 NOTE — Patient Instructions (Signed)
Schedule 1-hour EEG  2. Continue Depakote 500mg  twice a day  3. Follow-up in 6 months, call for any changes   Seizure Precautions: 1. If medication has been prescribed for you to prevent seizures, take it exactly as directed.  Do not stop taking the medicine without talking to your doctor first, even if you have not had a seizure in a long time.   2. Avoid activities in which a seizure would cause danger to yourself or to others.  Don't operate dangerous machinery, swim alone, or climb in high or dangerous places, such as on ladders, roofs, or girders.  Do not drive unless your doctor says you may.  3. If you have any warning that you may have a seizure, lay down in a safe place where you can't hurt yourself.    4.  No driving for 6 months from last seizure, as per [facility].   Please refer to the following link on the Epile[employer] Foundation of America's website for more information: http://www.epilepsyfoundation.org/answerplace/Social/driving/drivingu.cfm   5.  Maintain good sleep hygiene. Avoid alcohol.  6.  Contact your doctor if you have any problems that may be related to the medicine you are taking.  7.  Call 911 and bring the patient back to the ED if:        A.  The seizure lasts longer than 5 minutes.       B.  The patient doesn't awaken shortly after the seizure  C.  The patient has new problems such as difficulty seeing, speaking or moving  D.  The patient was injured during the seizure  E.  The patient has a temperature over 102 F (39C)  F.  The patient vomited and now is having trouble breathing

## 2021-06-27 ENCOUNTER — Other Ambulatory Visit: Payer: Self-pay

## 2021-06-27 ENCOUNTER — Ambulatory Visit (INDEPENDENT_AMBULATORY_CARE_PROVIDER_SITE_OTHER): Payer: Medicare Other | Admitting: Neurology

## 2021-06-27 DIAGNOSIS — R569 Unspecified convulsions: Secondary | ICD-10-CM | POA: Diagnosis not present

## 2021-07-05 ENCOUNTER — Telehealth: Payer: Self-pay | Admitting: Neurology

## 2021-07-05 NOTE — Procedures (Signed)
ELECTROENCEPHALOGRAM REPORT  Date of Study: 06/27/2001  Patient's Name: Austin Gutierrez MRN: [id number] Date of Birth: 07/27/1981  Referring Provider: Dr. Patrcia Dolly  Clinical History:  This is a 40 year old man with a remote history of seizure in childhood who had a convulsion on 06/07/2021.   Medications: DEPAKOTE 500 MG DR tablet COGENTIN 1 MG tablet FLEXERIL 10 MG tablet PROZAC 40 MG capsule HALDOL 10 MG tablet HALDOL DECANOATE 100 MG/ML injection ADVIL,MOTRIN 600 MG tablet ZESTORETIC 10-12.5 MG tablet MIRALAX / GLYCOLAX 17 g packet RESTORIL 30 MG capsule  Technical Summary: A multichannel digital 1-hour EEG recording measured by the international 10-20 system with electrodes applied with paste and impedances below 5000 ohms performed in our laboratory with EKG monitoring in an awake and drowsy patient.  Hyperventilation was not performed. Photic stimulation was performed.  The digital EEG was referentially recorded, reformatted, and digitally filtered in a variety of bipolar and referential montages for optimal display.    Description: The patient is awake and drowsy during the recording.  During maximal wakefulness, there is a symmetric, medium voltage 9 Hz posterior dominant rhythm that attenuates with eye opening.  The record is symmetric.  During drowsiness, there is an increase in theta slowing of the background.  Sleep was not captured. Photic stimulation did not elicit any abnormalities.  There were no epileptiform discharges or electrographic seizures seen.    EKG lead was unremarkable.  Impression: This 1-hour awake and drowsy EEG is normal.    Clinical Correlation: A normal EEG does not exclude a clinical diagnosis of epilepsy.  If further clinical questions remain, prolonged EEG may be helpful.  Clinical correlation is advised.   Patrcia Dolly, M.D.

## 2021-07-05 NOTE — Telephone Encounter (Signed)
Pt mother would like a call regarding his EEG. [phone]

## 2021-07-06 ENCOUNTER — Telehealth: Payer: Self-pay | Admitting: Neurology

## 2021-07-06 NOTE — Telephone Encounter (Signed)
Pt mother called back no answer left a voice mail to call the office back  

## 2021-07-06 NOTE — Telephone Encounter (Signed)
Pt's mother called back in returning Heather's voicemail about his results.

## 2021-07-06 NOTE — Telephone Encounter (Signed)
Pls let his mother know that the EEG was normal, however it is not like a pregnancy test that is positive or negative, it is just a snapshot of his brain wave when we do the test. Continue Depakote 500mg  BID for seizures and mood stabilization. Thanks

## 2021-07-06 NOTE — Telephone Encounter (Signed)
Pt mother called back no answer left a voice mail to call the office

## 2021-07-06 NOTE — Telephone Encounter (Signed)
See other phone note

## 2021-07-09 ENCOUNTER — Telehealth: Payer: Self-pay | Admitting: Neurology

## 2021-07-09 NOTE — Telephone Encounter (Signed)
Patient's mom [name] returned a call to [facility] about results.  Ms. Vita Barley works at [employer]. Please call her back there and ask for by name: [phone].

## 2021-07-09 NOTE — Telephone Encounter (Signed)
Spoke with pt mother and informed her that the EEG was normal, however it is not like a pregnancy test that is positive or negative, it is just a snapshot of his brain wave when we do the test. Continue Depakote 500mg  BID for seizures and mood stabilization

## 2021-07-09 NOTE — Telephone Encounter (Signed)
See other phone note

## 2021-07-09 NOTE — Telephone Encounter (Signed)
Patients mother is calling heather back regarding results [phone]

## 2021-07-17 DIAGNOSIS — F250 Schizoaffective disorder, bipolar type: Secondary | ICD-10-CM | POA: Diagnosis not present

## 2021-08-14 DIAGNOSIS — F250 Schizoaffective disorder, bipolar type: Secondary | ICD-10-CM | POA: Diagnosis not present

## 2021-08-17 DIAGNOSIS — R7303 Prediabetes: Secondary | ICD-10-CM | POA: Diagnosis not present

## 2021-08-17 DIAGNOSIS — E7800 Pure hypercholesterolemia, unspecified: Secondary | ICD-10-CM | POA: Diagnosis not present

## 2021-08-17 DIAGNOSIS — G43009 Migraine without aura, not intractable, without status migrainosus: Secondary | ICD-10-CM | POA: Diagnosis not present

## 2021-08-17 DIAGNOSIS — Z79899 Other long term (current) drug therapy: Secondary | ICD-10-CM | POA: Diagnosis not present

## 2021-09-11 DIAGNOSIS — F250 Schizoaffective disorder, bipolar type: Secondary | ICD-10-CM | POA: Diagnosis not present

## 2021-10-09 DIAGNOSIS — F250 Schizoaffective disorder, bipolar type: Secondary | ICD-10-CM | POA: Diagnosis not present

## 2021-10-26 DIAGNOSIS — E7800 Pure hypercholesterolemia, unspecified: Secondary | ICD-10-CM | POA: Diagnosis not present

## 2021-10-26 DIAGNOSIS — I10 Essential (primary) hypertension: Secondary | ICD-10-CM | POA: Diagnosis not present

## 2021-10-26 DIAGNOSIS — R7303 Prediabetes: Secondary | ICD-10-CM | POA: Diagnosis not present

## 2021-10-26 DIAGNOSIS — G43009 Migraine without aura, not intractable, without status migrainosus: Secondary | ICD-10-CM | POA: Diagnosis not present

## 2021-11-06 DIAGNOSIS — F250 Schizoaffective disorder, bipolar type: Secondary | ICD-10-CM | POA: Diagnosis not present

## 2021-12-04 DIAGNOSIS — F250 Schizoaffective disorder, bipolar type: Secondary | ICD-10-CM | POA: Diagnosis not present

## 2022-01-15 ENCOUNTER — Ambulatory Visit: Payer: Medicare Other | Admitting: Neurology

## 2022-01-16 ENCOUNTER — Telehealth (INDEPENDENT_AMBULATORY_CARE_PROVIDER_SITE_OTHER): Payer: Medicare Other | Admitting: Neurology

## 2022-01-16 ENCOUNTER — Other Ambulatory Visit: Payer: Self-pay

## 2022-01-16 ENCOUNTER — Encounter: Payer: Self-pay | Admitting: Neurology

## 2022-01-16 VITALS — Ht 74.0 in | Wt 204.0 lb

## 2022-01-16 DIAGNOSIS — R569 Unspecified convulsions: Secondary | ICD-10-CM | POA: Diagnosis not present

## 2022-01-16 NOTE — Patient Instructions (Signed)
Continue Depakote 500mg  twice a day  2. Follow-up in 6-8 months, call for any changes.    Seizure Precautions: 1. If medication has been prescribed for you to prevent seizures, take it exactly as directed.  Do not stop taking the medicine without talking to your doctor first, even if you have not had a seizure in a long time.   2. Avoid activities in which a seizure would cause danger to yourself or to others.  Don't operate dangerous machinery, swim alone, or climb in high or dangerous places, such as on ladders, roofs, or girders.  Do not drive unless your doctor says you may.  3. If you have any warning that you may have a seizure, lay down in a safe place where you can't hurt yourself.    4.  No driving for 6 months from last seizure, as per [facility].   Please refer to the following link on the Epile[employer] Foundation of America's website for more information: http://www.epilepsyfoundation.org/answerplace/Social/driving/drivingu.cfm   5.  Maintain good sleep hygiene. Avoid alcohol.  6.  Contact your doctor if you have any problems that may be related to the medicine you are taking.  7.  Call 911 and bring the patient back to the ED if:        A.  The seizure lasts longer than 5 minutes.       B.  The patient doesn't awaken shortly after the seizure  C.  The patient has new problems such as difficulty seeing, speaking or moving  D.  The patient was injured during the seizure  E.  The patient has a temperature over 102 F (39C)  F.  The patient vomited and now is having trouble breathing

## 2022-01-16 NOTE — Progress Notes (Signed)
Virtual Visit via Video Note The purpose of this virtual visit is to provide medical care while limiting exposure to the novel coronavirus.    Consent was obtained for video visit:  Yes.   Answered questions that patient had about telehealth interaction:  Yes.   I discussed the limitations, risks, security and privacy concerns of performing an evaluation and management service by telemedicine. I also discussed with the patient that there may be a patient responsible charge related to this service. The patient expressed understanding and agreed to proceed.  Pt location: Home Physician Location: office Name of referring provider:  Littie Deeds, MD I connected with Zollie Beckers at patients initiation/request on 01/16/2022 at  8:30 AM EST by video enabled telemedicine application and verified that I am speaking with the correct person using two identifiers. Pt MRN:  [id number] Pt DOB:  [birth year] Video Participants:  Zollie Beckers   History of Present Illness:  The patient had a virtual video visit on 01/16/2022. He was last seen 7 months ago for new onset seizure that occurred 06/07/21. His 1-hour EEG done 06/2021 was normal. MRI brain unremarkable. He was discharged from the ER on Depakote 500mg  BID and denies any seizures or seizure-like symptoms since 05/2021. The Depakote also helps with his mood. He denies any staring/unresponsive episodes, gaps in time, olfactory/gustatory hallucinations, focal numbness/tingling/weakness, myoclonic jerks. No headaches, dizziness, vision changes, no falls. Sleep and mood are "okay." No side effects on Depakote.   History on Initial Assessment 06/20/2021: This is a 42 year old left-handed man with a history of schizoaffective disorder, remote history of seizure in infancy, presenting for evaluation of seizure that occurred last 06/07/21. He was in his usual state of health with his friend. He states she smoked marijuana then they went into a store when he  started feeling weird, lightheaded, then woke up on the floor. He recalls speech was slurred, his face hurt from hitting the ground. No tongue bite or incontinence, no focal weakness. He lives with his mother who denies any staring/unresponsive episodes. He had one episode of loss of consciousness/seizure at 4 or 5 months old, and was not on any seizure medication. He denies any gaps in time, olfactory/gustatory hallucinations, deja vu, rising epigastric sensation, focal numbness/tingling/weakness, myoclonic jerks. He was brought to [facility] where CBC, CMP were normal except for mildly elevated at 1.37. EtOH negative, UDS positive for THC. I personally reviewed MRI brain without contrast no acute changes, there was minimal chronic microvascular disease, hippocampi symmetric. He was discharged on Depakote 500mg  BID. He has been seeing his psychiatrist and takes Fluoxetine, Haldol IM, Temazepam, and Cogentin. No recent medication changes. He denies any sleep deprivation or alcohol use. He denies any headaches ("not anymore"), dizziness, diplopia, dysarthria/dysphagia, neck/back pain, bowel/bladder dysfunction. Mood is okay. No auditory/visual hallucinations, his mother reports he has been doing well. He feels the Depakote has helped with mood as well. He had a normal birth and early development.  There is no history of febrile convulsions, CNS infections such as meningitis/encephalitis, significant traumatic brain injury, neurosurgical procedures, or family history of seizures.    Current Outpatient Medications on File Prior to Visit  Medication Sig Dispense Refill   benztropine (COGENTIN) 1 MG tablet Take 1 tablet (1 mg total) by mouth 2 (two) times daily. 60 tablet 2   cyclobenzaprine (FLEXERIL) 10 MG tablet Take 1 tablet (10 mg total) by mouth 3 (three) times daily as needed for muscle spasms (or pain). 10 tablet 0  divalproex (DEPAKOTE) 500 MG DR tablet Take 1 tablet (500 mg total) by mouth 2 (two) times daily.  60 tablet 11   FLUoxetine (PROZAC) 40 MG capsule Take 40 mg by mouth daily.     haloperidol (HALDOL) 10 MG tablet Take 1 tablet (10 mg total) by mouth at bedtime. 30 tablet 1   haloperidol decanoate (HALDOL DECANOATE) 100 MG/ML injection Inject 1.5 mLs (150 mg total) into the muscle every 30 (thirty) days. Due 4/10 1 mL 11   ibuprofen (ADVIL,MOTRIN) 600 MG tablet Take 1 tablet (600 mg total) by mouth every 6 (six) hours as needed. 30 tablet 0   lisinopril-hydrochlorothiazide (ZESTORETIC) 10-12.5 MG tablet Take 1 tablet by mouth daily.     polyethylene glycol (MIRALAX / GLYCOLAX) 17 g packet Take 17 g by mouth daily. 7 each 0   temazepam (RESTORIL) 30 MG capsule Take 1 capsule (30 mg total) by mouth at bedtime. 30 capsule 0   No current facility-administered medications on file prior to visit.     Observations/Objective:   Vitals:   01/16/22 0748  Weight: 204 lb (92.5 kg)  Height: 6\' 2"  (1.88 m)   GEN:  The patient appears stated age and is in NAD. Poor dentition.  Neurological examination: Patient is awake, alert. No aphasia or dysarthria. Intact fluency and comprehension.Cranial nerves: Extraocular movements intact with no nystagmus. No facial asymmetry. Motor: moves all extremities symmetrically, at least anti-gravity x 4.    Assessment and Plan:   This is a 41 yo LH man with a history of schizoaffective disorder, remote history of seizure in infancy, with new onset convulsive seizure last 06/07/2021. EEG and MRI unremarkable. He has been seizure-free since 05/2021 on Depakote 500mg  BID, it has helped with mood as well. Refill sent. We discussed avoidance of seizure triggers. He does not drive. Follow-up in 6-8 months, call for any changes.    Follow Up Instructions:   -I discussed the assessment and treatment plan with the patient. The patient was provided an opportunity to ask questions and all were answered. The patient agreed with the plan and demonstrated an understanding of the  instructions.   The patient was advised to call back or seek an in-person evaluation if the symptoms worsen or if the condition fails to improve as anticipated.     06/2021, MD

## 2022-03-12 IMAGING — CR DG HUMERUS 2V *L*
2 series · 2 of 2 positions shown · non-contrast
Comparison: None.

CLINICAL DATA: Atraumatic left upper extremity pain.

EXAM:
LEFT HUMERUS - 2+ VIEW

[w humerus ap left]
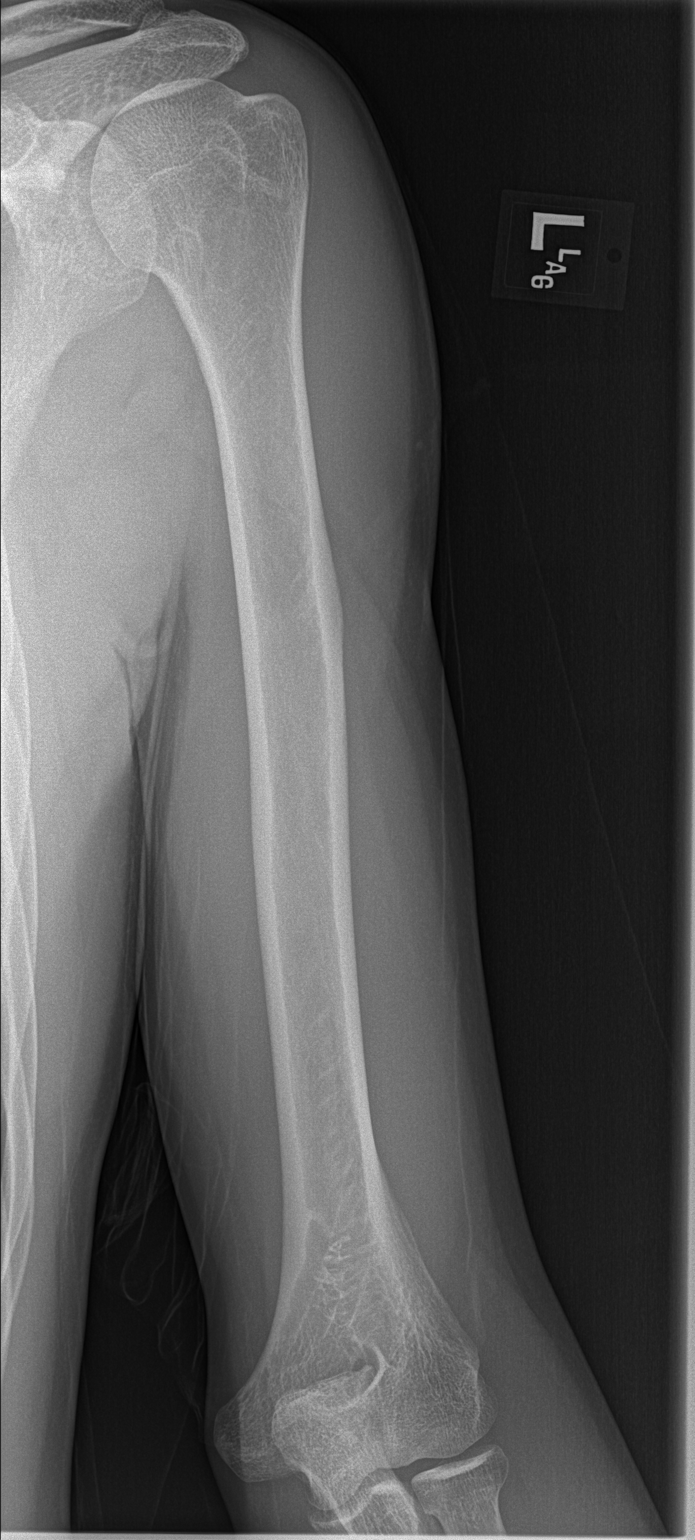

[w humerus lat left]
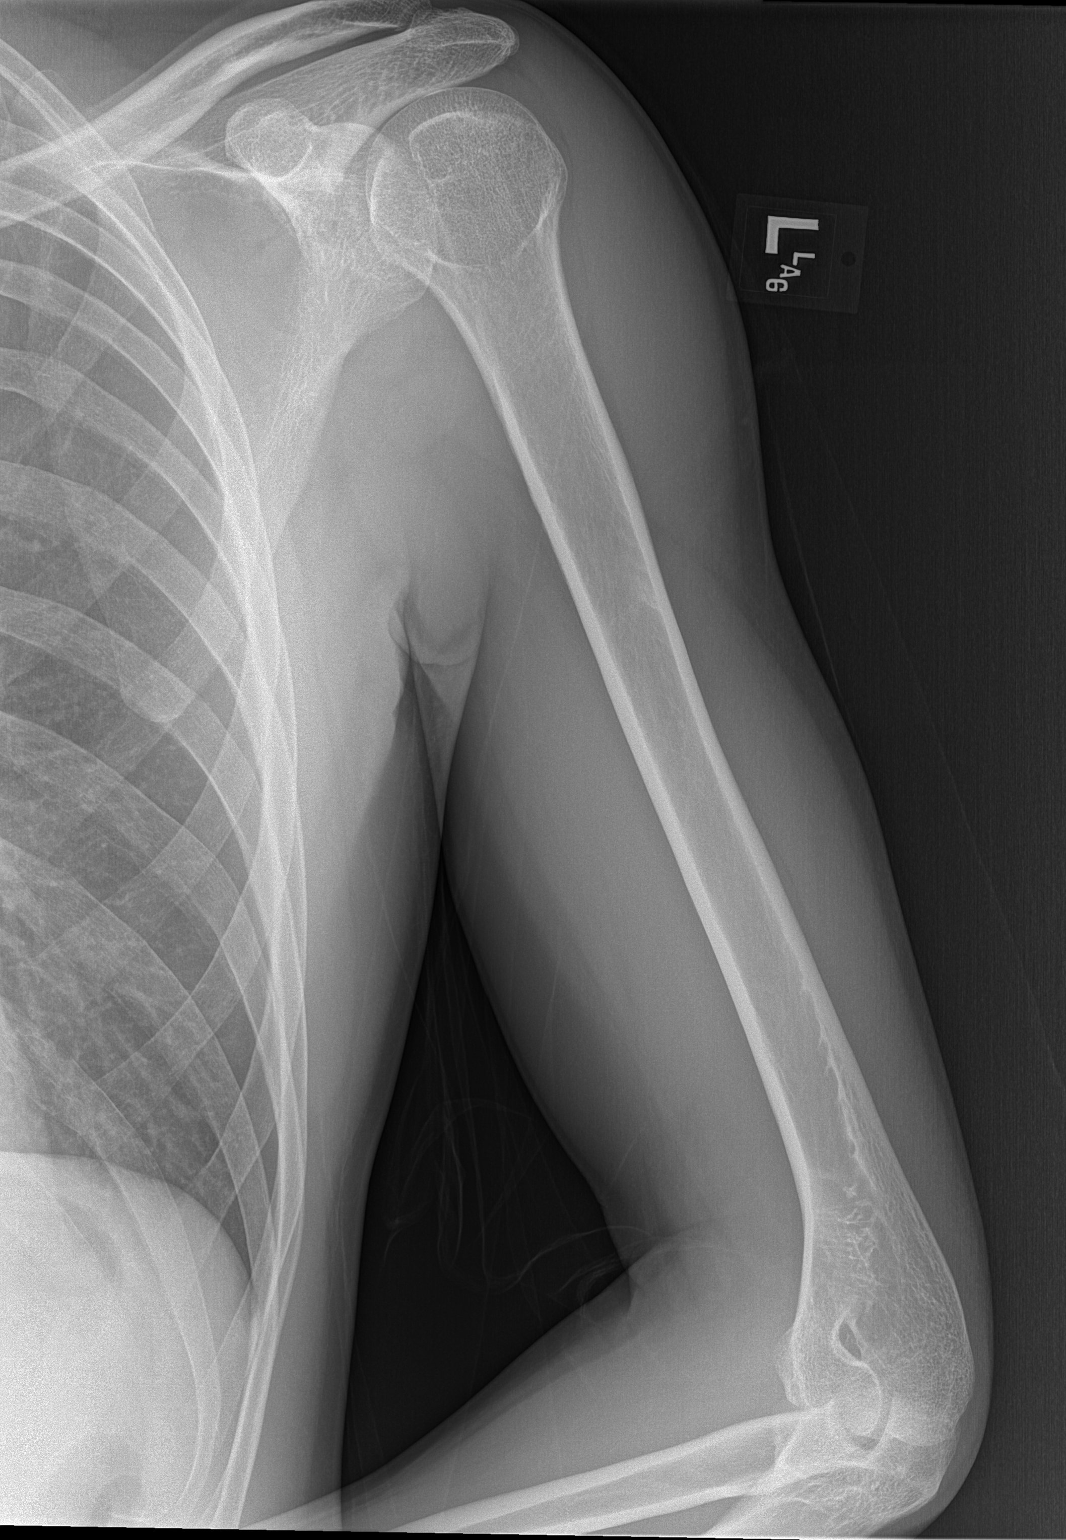

[2 of 2 positions shown; findings below may reference images not displayed]

FINDINGS: There is no evidence of fracture or other focal bone lesions. Soft
tissues are unremarkable.
IMPRESSION: Negative.

## 2022-05-11 IMAGING — CR DG ABDOMEN ACUTE W/ 1V CHEST
3 series · 3 of 3 positions shown · non-contrast
Comparison: None.

CLINICAL DATA: Abdominal pain.  LEFT lower quadrant pain

EXAM:
DG ABDOMEN ACUTE WITH 1 VIEW CHEST

[abdomen erect (1 of 2)]
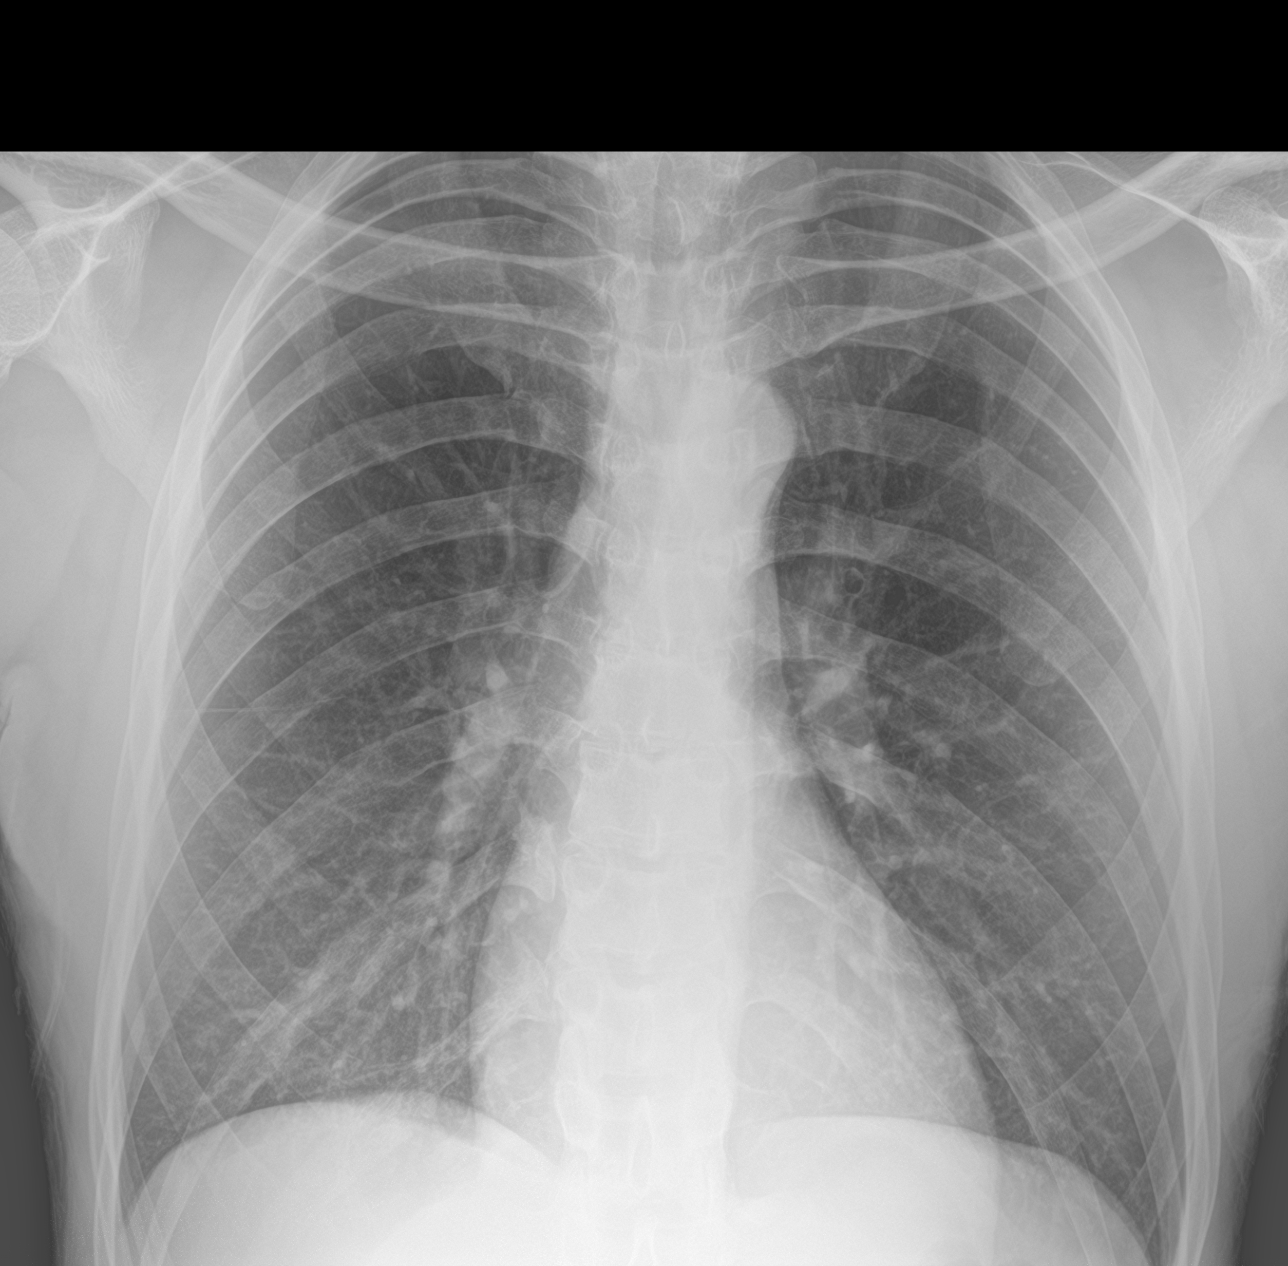

[abdomen supine]
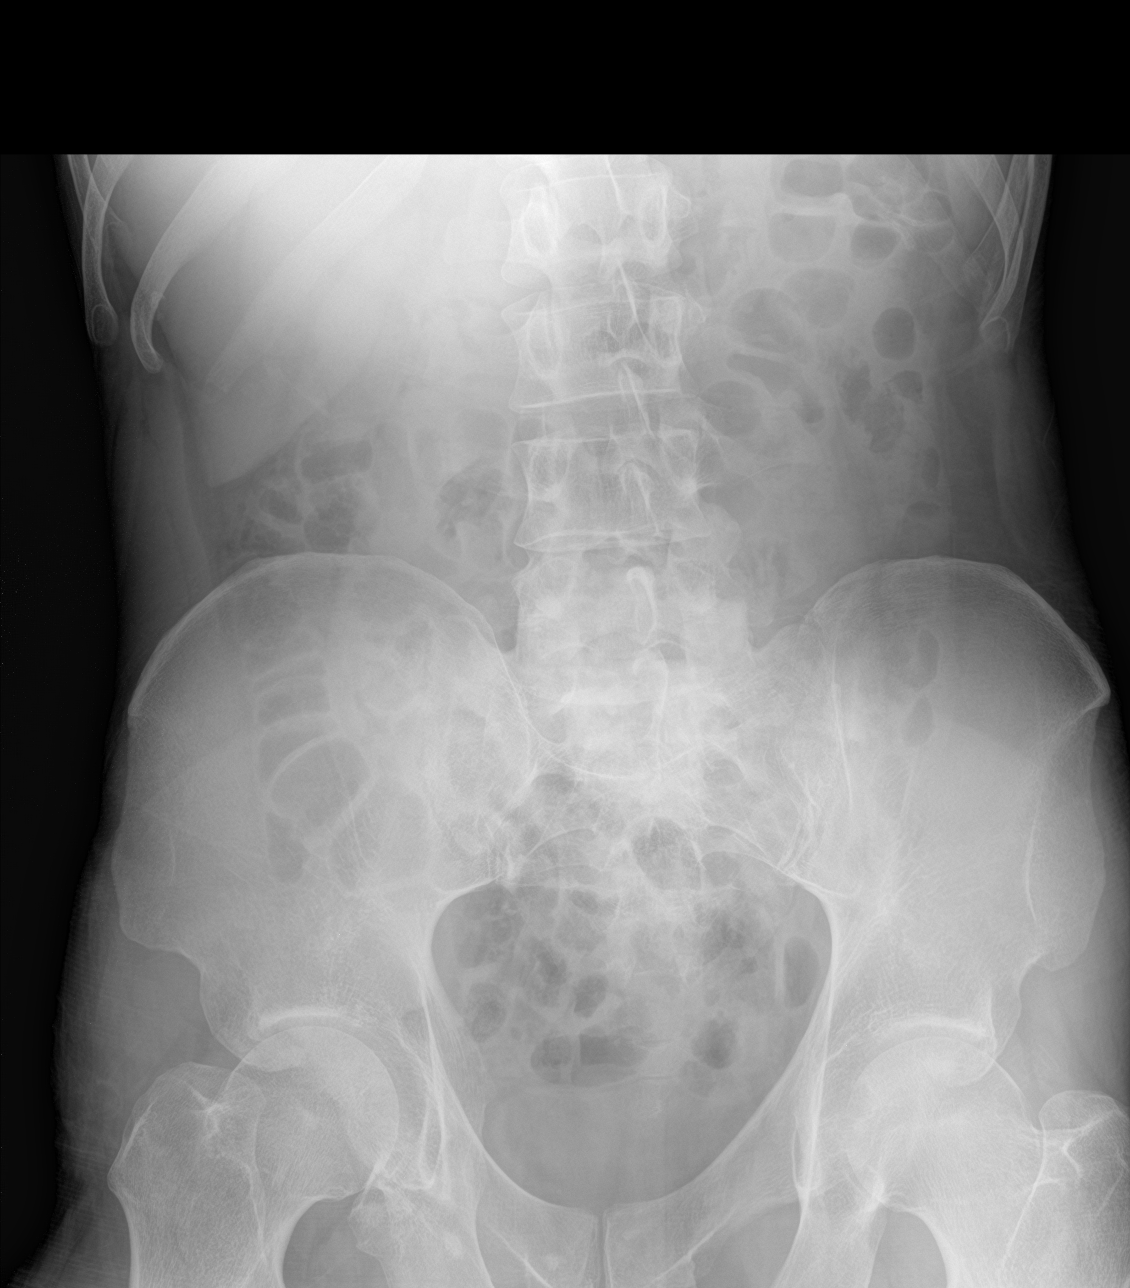

[abdomen erect (2 of 2)]
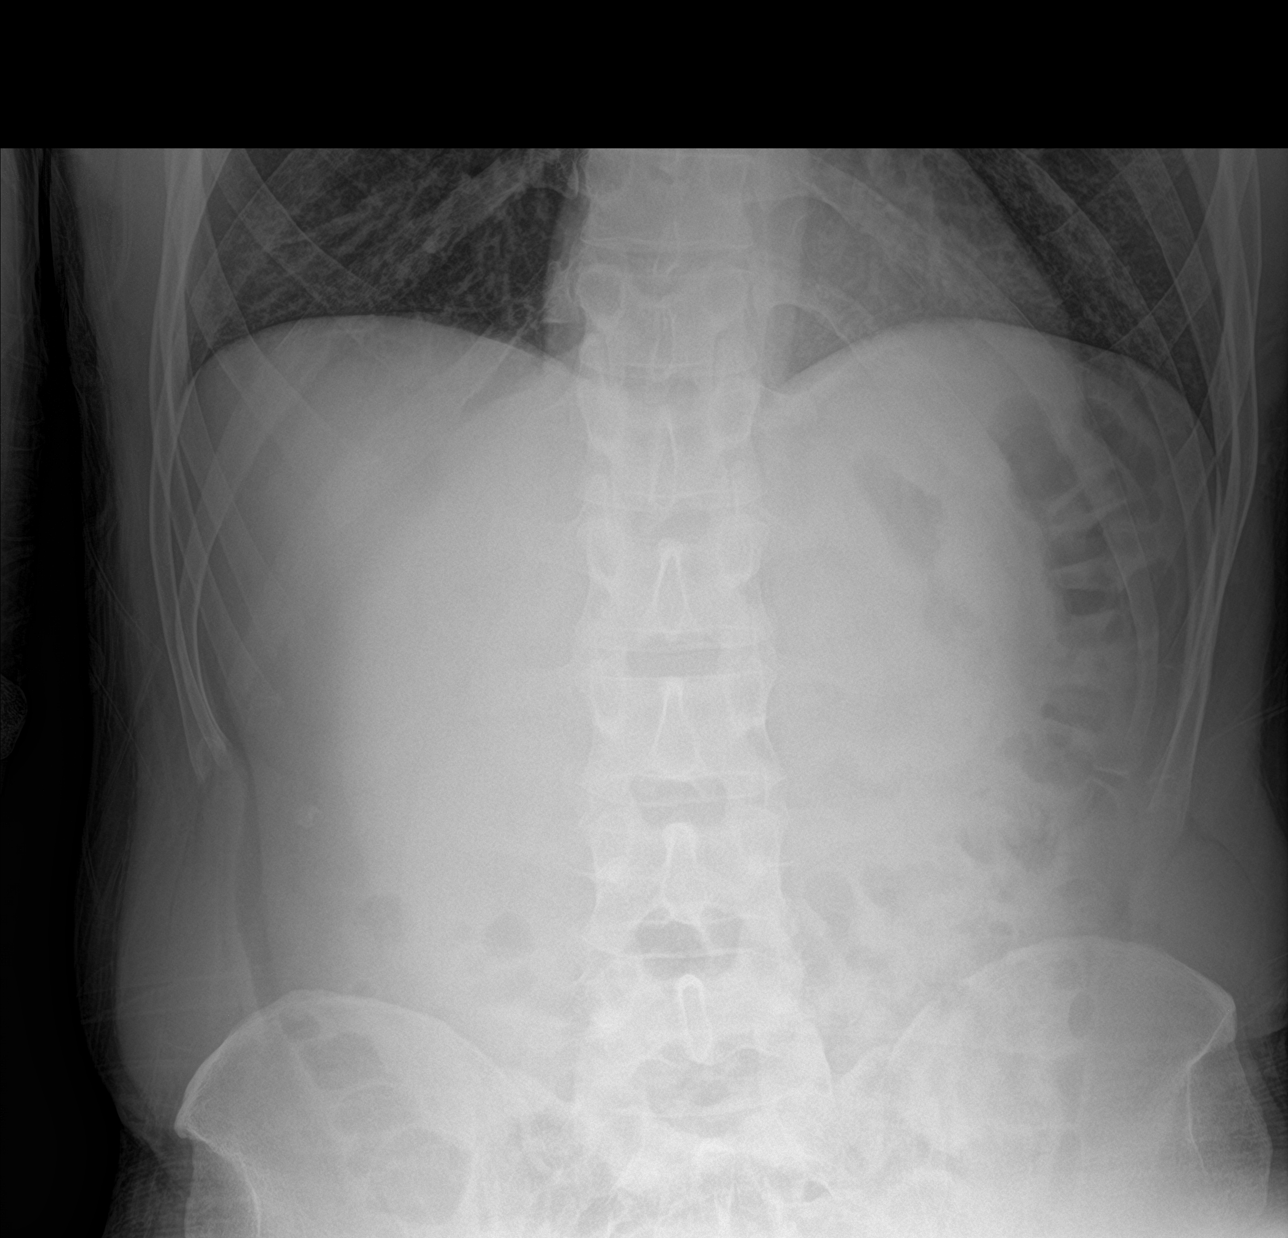

[3 of 3 positions shown; findings below may reference images not displayed]

FINDINGS: Normal mediastinum and cardiac silhouette. Normal pulmonary
vasculature. No evidence of effusion, infiltrate, or pneumothorax.
No acute bony abnormality.

No dilated loops of large or small bowel. Gas and stool in the
rectum. No pathologic calcifications. No organomegaly. No acute
osseous abnormality
IMPRESSION: 1. No acute cardiopulmonary process.
2. No bowel obstruction.

## 2022-05-21 ENCOUNTER — Encounter: Payer: Self-pay | Admitting: *Deleted

## 2022-10-07 ENCOUNTER — Ambulatory Visit: Payer: Medicare Other | Admitting: Neurology

## 2022-11-29 ENCOUNTER — Emergency Department (HOSPITAL_COMMUNITY)
Admission: EM | Admit: 2022-11-29 | Discharge: 2022-11-29 | Discharge status: Against Medical Advice | Payer: Medicare Other | Source: Home / Self Care

## 2022-11-30 ENCOUNTER — Emergency Department (HOSPITAL_COMMUNITY): Payer: Medicare Other

## 2022-11-30 ENCOUNTER — Emergency Department (HOSPITAL_COMMUNITY)
Admission: EM | Admit: 2022-11-30 | Discharge: 2022-11-30 | Discharge status: Against Medical Advice | Payer: Medicare Other | Attending: Student | Admitting: Student

## 2022-11-30 DIAGNOSIS — R0602 Shortness of breath: Secondary | ICD-10-CM | POA: Diagnosis present

## 2022-11-30 DIAGNOSIS — U071 COVID-19: Secondary | ICD-10-CM | POA: Insufficient documentation

## 2022-11-30 DIAGNOSIS — Z5321 Procedure and treatment not carried out due to patient leaving prior to being seen by health care provider: Secondary | ICD-10-CM | POA: Diagnosis not present

## 2022-11-30 LAB — TROPONIN I (HIGH SENSITIVITY): Troponin I (High Sensitivity): 13 ng/L (ref <18)

## 2022-11-30 LAB — BASIC METABOLIC PANEL
Anion gap: 11 (ref 5–15)
BUN: 20 mg/dL (ref 6–20)
CO2: 25 mmol/L (ref 22–32)
Calcium: 9.4 mg/dL (ref 8.9–10.3)
Chloride: 101 mmol/L (ref 98–111)
Creatinine, Ser: 1.33 mg/dL — ABNORMAL HIGH (ref 0.61–1.24)
GFR, Estimated: >60 mL/min (ref >60)
Glucose, Bld: 131 mg/dL — ABNORMAL HIGH (ref 70–99)
Potassium: 3.4 mmol/L — ABNORMAL LOW (ref 3.5–5.1)
Sodium: 137 mmol/L (ref 135–145)

## 2022-11-30 LAB — CBC WITH DIFFERENTIAL/PLATELET
Abs Immature Granulocytes: 0.04 10*3/uL (ref 0.00–0.07)
Basophils Absolute: 0.1 10*3/uL (ref 0.0–0.1)
Basophils Relative: 1 %
Eosinophils Absolute: 0 10*3/uL (ref 0.0–0.5)
Eosinophils Relative: 0 %
HCT: 42.4 % (ref 39.0–52.0)
Hemoglobin: 14.1 g/dL (ref 13.0–17.0)
Immature Granulocytes: 1 %
Lymphocytes Relative: 8 %
Lymphs Abs: 0.7 10*3/uL (ref 0.7–4.0)
MCH: 28.6 pg (ref 26.0–34.0)
MCHC: 33.3 g/dL (ref 30.0–36.0)
MCV: 86 fL (ref 80.0–100.0)
Monocytes Absolute: 0.8 10*3/uL (ref 0.1–1.0)
Monocytes Relative: 9 %
Neutro Abs: 7.3 10*3/uL (ref 1.7–7.7)
Neutrophils Relative %: 81 %
Platelets: 303 10*3/uL (ref 150–400)
RBC: 4.93 MIL/uL (ref 4.22–5.81)
RDW: 13.6 % (ref 11.5–15.5)
WBC: 8.8 10*3/uL (ref 4.0–10.5)
nRBC: 0 % (ref 0.0–0.2)

## 2022-11-30 LAB — RESP PANEL BY RT-PCR (RSV, FLU A&B, COVID)  RVPGX2
Influenza A by PCR: NEGATIVE
Influenza B by PCR: NEGATIVE
Resp Syncytial Virus by PCR: NEGATIVE
SARS Coronavirus 2 by RT PCR: POSITIVE — AB

## 2022-11-30 NOTE — ED Provider Triage Note (Signed)
Emergency Medicine Provider Triage Evaluation Note  Austin Gutierrez , a 41 y.o. male  was evaluated in triage.  Pt complains of cough, shortness of breath and chest pain.  Patient states that symptoms have been for 2 days.  He describes having dry cough without other symptoms such as sore throat, fever, congestion or rhinorrhea, chills, vomiting or diarrhea.  He also is complaining of shortness of breath.  He states that he has also had left-sided chest pain that he describes as tightness that is nonradiating.  Denies palpitations..  Review of Systems  Positive: See above Negative:   Physical Exam  BP (!) 163/102   Pulse (!) 114   Temp 97.9 F (36.6 C) (Oral)   Resp 18   SpO2 96%  Gen:   Awake, no distress   Resp:  Mildly tachypneic, lungs are clear MSK:   Moves extremities without difficulty  Other:  Oropharyngeal erythema without airway compromise.  Lungs are clear bilaterally.  Medical Decision Making  Medically screening exam initiated at 1:17 PM.  Appropriate orders placed.  Hatcher Froning was informed that the remainder of the evaluation will be completed by another provider, this initial triage assessment does not replace that evaluation, and the importance of remaining in the ED until their evaluation is complete.     Cristopher Peru, PA-C 11/30/22 1319

## 2022-11-30 NOTE — ED Triage Notes (Signed)
Pt reports coughing that leads to vomiting, shortness of breath, and chest pain

## 2022-12-01 ENCOUNTER — Ambulatory Visit: Payer: Self-pay

## 2023-04-28 ENCOUNTER — Ambulatory Visit: Payer: 59 | Admitting: Neurology

## 2023-04-28 ENCOUNTER — Encounter: Payer: Self-pay | Admitting: Neurology
